# Patient Record
Sex: Male | Born: 1950
Health system: Southern US, Community
[De-identification: ages and names within clinical notes are randomized; demographics above are authoritative.]

## PROBLEM LIST (undated history)

## (undated) DIAGNOSIS — Z860101 Personal history of adenomatous and serrated colon polyps: Secondary | ICD-10-CM

## (undated) DIAGNOSIS — Z86010 Personal history of colonic polyps: Secondary | ICD-10-CM

## (undated) DIAGNOSIS — M1990 Unspecified osteoarthritis, unspecified site: Secondary | ICD-10-CM

## (undated) DIAGNOSIS — E785 Hyperlipidemia, unspecified: Secondary | ICD-10-CM

## (undated) DIAGNOSIS — K649 Unspecified hemorrhoids: Secondary | ICD-10-CM

## (undated) DIAGNOSIS — K219 Gastro-esophageal reflux disease without esophagitis: Secondary | ICD-10-CM

## (undated) DIAGNOSIS — D6851 Activated protein C resistance: Secondary | ICD-10-CM

## (undated) DIAGNOSIS — F32A Depression, unspecified: Secondary | ICD-10-CM

## (undated) DIAGNOSIS — C4490 Unspecified malignant neoplasm of skin, unspecified: Secondary | ICD-10-CM

## (undated) DIAGNOSIS — F329 Major depressive disorder, single episode, unspecified: Secondary | ICD-10-CM

## (undated) DIAGNOSIS — I10 Essential (primary) hypertension: Secondary | ICD-10-CM

## (undated) DIAGNOSIS — H3581 Retinal edema: Secondary | ICD-10-CM

## (undated) DIAGNOSIS — N529 Male erectile dysfunction, unspecified: Secondary | ICD-10-CM

## (undated) DIAGNOSIS — I2699 Other pulmonary embolism without acute cor pulmonale: Secondary | ICD-10-CM

## (undated) HISTORY — DX: Essential (primary) hypertension: I10

## (undated) HISTORY — PX: INGUINAL HERNIA REPAIR: SUR1180

## (undated) HISTORY — PX: SHOULDER ARTHROSCOPY W/ ROTATOR CUFF REPAIR: SHX2400

## (undated) HISTORY — PX: TONSILLECTOMY AND ADENOIDECTOMY: SUR1326

## (undated) HISTORY — PX: ULNAR TUNNEL RELEASE: SHX820

## (undated) HISTORY — DX: Hyperlipidemia, unspecified: E78.5

## (undated) HISTORY — PX: CARPAL TUNNEL RELEASE: SHX101

---

## 1997-09-28 ENCOUNTER — Ambulatory Visit (HOSPITAL_BASED_OUTPATIENT_CLINIC_OR_DEPARTMENT_OTHER): Admission: RE | Admit: 1997-09-28 | Discharge: 1997-09-28 | Payer: Self-pay | Admitting: General Surgery

## 2001-08-11 ENCOUNTER — Ambulatory Visit (HOSPITAL_BASED_OUTPATIENT_CLINIC_OR_DEPARTMENT_OTHER): Admission: RE | Admit: 2001-08-11 | Discharge: 2001-08-12 | Payer: Self-pay | Admitting: Orthopedic Surgery

## 2001-12-17 ENCOUNTER — Ambulatory Visit (HOSPITAL_BASED_OUTPATIENT_CLINIC_OR_DEPARTMENT_OTHER): Admission: RE | Admit: 2001-12-17 | Discharge: 2001-12-17 | Payer: Self-pay | Admitting: Orthopedic Surgery

## 2001-12-17 HISTORY — PX: OTHER SURGICAL HISTORY: SHX169

## 2002-03-18 ENCOUNTER — Ambulatory Visit (HOSPITAL_BASED_OUTPATIENT_CLINIC_OR_DEPARTMENT_OTHER): Admission: RE | Admit: 2002-03-18 | Discharge: 2002-03-18 | Payer: Self-pay | Admitting: General Surgery

## 2004-07-04 ENCOUNTER — Ambulatory Visit (HOSPITAL_COMMUNITY): Admission: RE | Admit: 2004-07-04 | Discharge: 2004-07-04 | Payer: Self-pay | Admitting: Gastroenterology

## 2005-08-05 ENCOUNTER — Encounter: Admission: RE | Admit: 2005-08-05 | Discharge: 2005-08-05 | Payer: Self-pay | Admitting: Internal Medicine

## 2006-06-25 ENCOUNTER — Encounter (INDEPENDENT_AMBULATORY_CARE_PROVIDER_SITE_OTHER): Payer: Self-pay | Admitting: *Deleted

## 2006-06-25 ENCOUNTER — Ambulatory Visit (HOSPITAL_BASED_OUTPATIENT_CLINIC_OR_DEPARTMENT_OTHER): Admission: RE | Admit: 2006-06-25 | Discharge: 2006-06-25 | Payer: Self-pay | Admitting: General Surgery

## 2006-06-25 HISTORY — PX: SOFT TISSUE MASS EXCISION: SHX2419

## 2009-04-23 ENCOUNTER — Observation Stay (HOSPITAL_COMMUNITY): Admission: RE | Admit: 2009-04-23 | Discharge: 2009-04-24 | Payer: Self-pay | Admitting: Neurological Surgery

## 2009-04-23 HISTORY — PX: ANTERIOR CERVICAL DECOMP/DISCECTOMY FUSION: SHX1161

## 2009-11-08 HISTORY — PX: ACHILLES TENDON REPAIR: SUR1153

## 2010-02-19 ENCOUNTER — Encounter: Payer: Self-pay | Admitting: Cardiovascular Disease

## 2010-02-19 ENCOUNTER — Ambulatory Visit: Payer: Self-pay

## 2010-02-19 DIAGNOSIS — M79609 Pain in unspecified limb: Secondary | ICD-10-CM | POA: Insufficient documentation

## 2010-02-22 ENCOUNTER — Encounter
Admission: RE | Admit: 2010-02-22 | Discharge: 2010-02-22 | Payer: Self-pay | Source: Home / Self Care | Attending: Orthopedic Surgery | Admitting: Orthopedic Surgery

## 2010-04-11 NOTE — Miscellaneous (Signed)
Summary: Orders Update  Clinical Lists Changes  Problems: Added new problem of LEG PAIN (ICD-729.5) Orders: Added new Test order of Venous Duplex Lower Extremity (Venous Duplex Lower) - Signed 

## 2010-05-29 LAB — BASIC METABOLIC PANEL
CO2: 28 mEq/L (ref 19–32)
Calcium: 9.1 mg/dL (ref 8.4–10.5)
Chloride: 103 mEq/L (ref 96–112)
Creatinine, Ser: 1.01 mg/dL (ref 0.4–1.5)
GFR calc Af Amer: >60 mL/min (ref >60)
Glucose, Bld: 93 mg/dL (ref 70–99)

## 2010-05-29 LAB — CBC
MCHC: 35.2 g/dL (ref 30.0–36.0)
MCV: 93.6 fL (ref 78.0–100.0)
RBC: 5.47 MIL/uL (ref 4.22–5.81)
RDW: 12.9 % (ref 11.5–15.5)

## 2010-05-29 LAB — TYPE AND SCREEN: ABO/RH(D): A POS

## 2010-07-17 ENCOUNTER — Emergency Department (HOSPITAL_COMMUNITY)
Admission: EM | Admit: 2010-07-17 | Discharge: 2010-07-17 | Disposition: A | Discharge status: Home / Self Care | Payer: BC Managed Care – PPO | Attending: Emergency Medicine | Admitting: Emergency Medicine

## 2010-07-17 DIAGNOSIS — I10 Essential (primary) hypertension: Secondary | ICD-10-CM | POA: Insufficient documentation

## 2010-07-17 DIAGNOSIS — F3289 Other specified depressive episodes: Secondary | ICD-10-CM | POA: Insufficient documentation

## 2010-07-17 DIAGNOSIS — F329 Major depressive disorder, single episode, unspecified: Secondary | ICD-10-CM | POA: Insufficient documentation

## 2010-07-17 DIAGNOSIS — R45851 Suicidal ideations: Secondary | ICD-10-CM | POA: Insufficient documentation

## 2010-07-17 LAB — COMPREHENSIVE METABOLIC PANEL WITH GFR
Alkaline Phosphatase: 31 U/L — ABNORMAL LOW (ref 39–117)
BUN: 12 mg/dL (ref 6–23)
CO2: 24 meq/L (ref 19–32)
Chloride: 101 meq/L (ref 96–112)
Creatinine, Ser: 0.89 mg/dL (ref 0.4–1.5)
GFR calc non Af Amer: >60 mL/min (ref >60)
Glucose, Bld: 87 mg/dL (ref 70–99)
Potassium: 3.3 meq/L — ABNORMAL LOW (ref 3.5–5.1)
Total Bilirubin: 0.5 mg/dL (ref 0.3–1.2)

## 2010-07-17 LAB — RAPID URINE DRUG SCREEN, HOSP PERFORMED
Amphetamines: NOT DETECTED
Barbiturates: NOT DETECTED
Benzodiazepines: NOT DETECTED
Cocaine: NOT DETECTED
Opiates: NOT DETECTED
Tetrahydrocannabinol: NOT DETECTED

## 2010-07-17 LAB — COMPREHENSIVE METABOLIC PANEL
ALT: 25 U/L (ref 0–53)
AST: 24 U/L (ref 0–37)
Albumin: 4.1 g/dL (ref 3.5–5.2)
Calcium: 9.4 mg/dL (ref 8.4–10.5)
GFR calc Af Amer: >60 mL/min (ref >60)
Sodium: 137 mEq/L (ref 135–145)
Total Protein: 6.9 g/dL (ref 6.0–8.3)

## 2010-07-17 LAB — DIFFERENTIAL
Basophils Absolute: 0.1 10*3/uL (ref 0.0–0.1)
Basophils Relative: 1 % (ref 0–1)
Eosinophils Absolute: 0.1 K/uL (ref 0.0–0.7)
Eosinophils Relative: 1 % (ref 0–5)
Lymphocytes Relative: 18 % (ref 12–46)
Lymphs Abs: 1.6 K/uL (ref 0.7–4.0)
Monocytes Absolute: 0.9 10*3/uL (ref 0.1–1.0)
Monocytes Relative: 10 % (ref 3–12)
Neutro Abs: 6.2 10*3/uL (ref 1.7–7.7)
Neutrophils Relative %: 70 % (ref 43–77)

## 2010-07-17 LAB — CBC
HCT: 48.6 % (ref 39.0–52.0)
Hemoglobin: 17.8 g/dL — ABNORMAL HIGH (ref 13.0–17.0)
MCH: 32.8 pg (ref 26.0–34.0)
MCHC: 36.6 g/dL — ABNORMAL HIGH (ref 30.0–36.0)
MCV: 89.5 fL (ref 78.0–100.0)
Platelets: 238 K/uL (ref 150–400)
RBC: 5.43 MIL/uL (ref 4.22–5.81)
RDW: 13 % (ref 11.5–15.5)
WBC: 8.8 10*3/uL (ref 4.0–10.5)

## 2010-07-17 LAB — ETHANOL: Alcohol, Ethyl (B): <11 mg/dL — ABNORMAL HIGH (ref 0–10)

## 2010-07-26 NOTE — Op Note (Signed)
NAME:  Jacob [name], Jacob [name] NO.:  [id number]   MEDICAL RECORD NO.:  [id number]                   PATIENT TYPE:  AMB   LOCATION:  DSC                                  FACILITY:  [facility]   PHYSICIAN:  Angelia Mould. Derrell Lolling, M.D.             DATE OF BIRTH:  [birth year]   DATE OF PROCEDURE:  DATE OF DISCHARGE:                                 OPERATIVE REPORT   PREOPERATIVE DIAGNOSIS:  Left inguinal hernia.   POSTOPERATIVE DIAGNOSIS:  Left inguinal hernia.   OPERATION PERFORMED:  Repair of left inguinal hernia with mesh Armanda Heritage  repair).   SURGEON:  Dr. Claud Kelp.   OPERATIVE INDICATIONS:  This is a 60 year old, white man who has had a left  inguinal hernia for a few years and has been slowly enlarging.  It has  become uncomfortable and has begun to interfere with his activities.  No  history of incarceration.  On exam, he has moderately large left inguinal  hernia that extends part way into the scrotum but it is completely  reducible.  He had a previous laparoscopic repair of his right inguinal  hernia and that repair is intact.  He is brought to the operating room  electively.   OPERATIVE TECHNIQUE:  The patient was placed supine on the operating room  table.  He was monitored and sedated by the Anesthesia Department.  The left  groin was prepped and draped in the sterile fashion.  1% Xylocaine with  epinephrine were used as a local infiltration anesthetic and a left  ilioinguinal nerve block.  An oblique incision was made overlying the  inguinal canal.  Dissection was carried down to the subcutaneous tissue,  exposing the aponeurosis of the external oblique.  The external oblique was  incised in the direction of its fibers, opening up and exposing the cord  structures.  The ilioinguinal nerve was immediately apparent and was  carefully dissected away from the cord structures and retracted out of  harm's way and preserved.  The nerve was intact at  the completion of the  case.  The cord structures were mobilized and encircled with a Penrose  drain.  The cremasteric muscle fibers were skeletonized.  I dissected a  large lipoma away from the cord structures, dissected it all the way back to  the internal ring.  This was examined, clamped, divided and ligated with 2-0  silk tie.  The lipoma was discarded.  He had a moderate sized indirect  hernia sac which I then dissected away from the cord structures, all the way  back to the level of the internal ring.  I opened the sac and inspected it  visually and digitally, and found that it was a simple sac with no  incarcerated components.  The sac was twisted and suture ligated at the  level of the internal ring with a suture ligature of 2-0  silk.  The floor of  the inguinal canal was repaired and reinforced with an onlay graft of  polypropylene mesh.  The mesh was cut into an appropriate shape and sutured  in place with running sutures of 2-0 Prolene.  A 3 inch x 16 inch piece of  mesh was used and cut down to size a little bit.  The mesh was sutured so as  to generously overlap the fascia and the pubic tubercle, along the inguinal  ligament inferiorly, then along the internal oblique and conjoined tendon  superiorly.  The mesh was incised laterally so as to wrap around the cord  structures at the internal ring.  The tails of the mesh were overlapped  laterally and the suture lines completed.  This provided a very secure  repair, both medial and lateral to the internal ring but allowed an adequate  opening for the cord structures.  The wound was irrigated with saline,  hemostasis was excellent.  The external oblique aponeurosis was closed with  a running suture of 2-0 Vicryl.  The Scarpa's fascia was closed with 3-0  Vicryl and the skin was closed with running subcuticular suture of 4-0  Vicryl and Steri-Strips.  Clean bandages were placed and the patient was  taken to the recovery room in  stable condition.  Estimated blood loss was  about 10 cc.    COMPLICATIONS:  None.   Sponge, needle and instrument counts were correct.                                               Angelia Mould. Derrell Lolling, M.D.    HMI/MEDQ  D:  03/18/2002  T:  03/19/2002  Job:  [id number]   cc:   Theressa Millard, M.D.  [address] [place]  [place] [address]  Fax: [phone]

## 2010-07-26 NOTE — Op Note (Signed)
NAMECHEN, Jacob NO.:  [name]   MEDICAL RECORD NO.:  [id number]          PATIENT TYPE:  AMB   LOCATION:  DSC                          FACILITY:  [facility]   PHYSICIAN:  Angelia Mould. Derrell Lolling, M.D.DATE OF BIRTH:  1950-11-27   DATE OF PROCEDURE:  06/25/2006  DATE OF DISCHARGE:                               OPERATIVE REPORT   PREOPERATIVE DIAGNOSIS:  Soft tissue mass of the right forearm.   POSTOPERATIVE DIAGNOSIS:  Soft tissue mass of the right forearm.   OPERATION PERFORMED:  Excision of 2.0 x 1.5 cm soft tissue mass, right  forearm   SURGEON:  Angelia Mould. Derrell Lolling, M.D.   OPERATIVE INDICATIONS:  This is a 60 year old white man who has noted a  soft tissue mass on the ulnar aspect of his right forearm for about 2  months.  It has been enlarging and has been irritating.  He has never  had a mass like this in the past.  On examination there is a 2.5 x 1.5  cm subcutaneous mass, somewhat lobulated, on the ulnar aspect of the mid  right forearm.  Consistency is consistent with a lipoma.  He is brought  to operating room electively.   OPERATIVE TECHNIQUE:  The patient was brought to the minor procedure  room at [facility] day surgery center.  He was positioned on the table.  The  right forearm was prepped and draped in a sterile fashion.  Xylocaine 1%  with epinephrine was used as a local infiltration anesthetic.  A  longitudinally-oriented incision was made overlying the mass.  Dissection was carried down into the subcutaneous tissue and then we  dissected around what appeared to be a very loosely-encapsulated  lipomatous-type mass.  This was excised in its entirety.  This was sent  for routine pathology.  Hemostasis was excellent.  The subcutaneous  tissue was closed with interrupted sutures of 3-0 Vicryl and the skin  closed with a running subcuticular suture of 4-0 Monocryl and Steri-  Strips.  A clean bandage was placed and the patient returned to the  waiting room  in excellent condition.  Estimated blood loss was about 5  mL.  Complications:  None.  Sponge, needle and instrument were correct.      Angelia Mould. Derrell Lolling, M.D.  Electronically Signed     HMI/MEDQ  D:  06/25/2006  T:  06/25/2006  Job:  [id number]   cc:   Theressa Millard, M.D.

## 2010-07-26 NOTE — Op Note (Signed)
NAME:  Jacob [name], BUSHWAY NO.:  [id number]   MEDICAL RECORD NO.:  [id number]                   PATIENT TYPE:  AMB   LOCATION:  DSC                                  FACILITY:  [facility]   PHYSICIAN:  Loreta Ave, M.D.              DATE OF BIRTH:  06-28-1950   DATE OF PROCEDURE:  12/17/2001  DATE OF DISCHARGE:  12/17/2001                                 OPERATIVE REPORT   PREOPERATIVE DIAGNOSES:  1. Impingement, left shoulder, with degenerative joint disease,     acromioclavicular joint.  2. Partial-thickness tearing, rotator cuff.   POSTOPERATIVE DIAGNOSES:  1. Impingement, left shoulder, with degenerative joint disease,     acromioclavicular joint.  2. Partial-thickness tearing, rotator cuff.  3. Some complex tearing, superior labrum.   PROCEDURES:  1. Left shoulder examination under anesthesia, arthroscopy, debridement of     labrum and undersurface tearing, rotator cuff.  2. Resection of subacromial bursa.  3. Acromioplasty.  4. Coracoacromial ligament release.  5. Excision of distal clavicle.   SURGEON:  Loreta Ave, M.D.   ASSISTANT:  Arlys Trei D. Petrarca, P.A.-C.   ANESTHESIA:  General.   ESTIMATED BLOOD LOSS:  Minimal.   SPECIMENS:  None.   CULTURES:  None.   COMPLICATIONS:  None.   DRESSING:  Soft compressive with sling.   DESCRIPTION OF PROCEDURE:  The patient was brought to the operating room and  placed on the operating table in supine position.  After adequate anesthesia  had been obtained, the left shoulder examined.  Full motion and good  stability.  Three standard arthroscopic portals for shoulder arthroscopy,  anterior, posterior, lateral.  The shoulder entered with a blunt obturator,  distended, and inspected.  Complex tearing of the labrum superiorly,  debrided to a stable surface.  No instability pattern.  Biceps tendon and  anchor intact with some mobility of the anchor but not a true SLAP lesion  requiring  repair.  Undersurface tearing crescent region.  The supraspinatus  tendon anterior half torn about halfway through but not all the way through.  Debrided to a stable surface.  Remaining rotator cuff intact.  Cannula  redirected subacromially.  Typical impingement with thickened bursa and  abrasive tearing on the top of the cuff.  Type 2 acromion.  Bursa resected,  cuff debrided and inspected.  Acromioplasty to a type 1 acromion with shaver  and high-speed bur, releasing the CA ligament with cautery.  Distal clavicle  had grade 3 and 4 changes also contributing to impingement.  Lateral  centimeter resected with shaver and high-speed bur.  Adequacy of  decompression confirmed viewing from all portals.  Instruments and fluid  removed.  Portals, shoulder, and bursa injected with Marcaine.  Portals  closed with 4-0 nylon.  Sterile compressive dressing applied.  Anesthesia  reversed.  Brought to the recovery room.  Tolerated the surgery well with  no  complications.                                                Loreta Ave, M.D.    DFM/MEDQ  D:  12/20/2001  T:  12/20/2001  Job:  [id number]

## 2010-07-26 NOTE — Op Note (Signed)
[place], HETZ NO.:  [id number]   MEDICAL RECORD NO.:  [id number]          PATIENT TYPE:  AMB   LOCATION:  ENDO                         FACILITY:  [facility]   PHYSICIAN:  Danise Edge, M.D.   DATE OF BIRTH:  1950-07-05   DATE OF PROCEDURE:  07/04/2004  DATE OF DISCHARGE:                                 OPERATIVE REPORT   PROCEDURE:  Screening colonoscopy.   INDICATIONS:  Jacob [name]. [name] 60 year old male, born 1951/02/19.  Jacob [name] is scheduled to undergo his first screening colonoscopy with  polypectomy to prevent colon cancer.   ENDOSCOPIST:  Dr. Reece Agar   PREMEDICATION:  1.  Versed 10 mg.  2.  Demerol 70 mg.   PROCEDURE:  After obtaining informed consent, Jacob [name] was placed in the  left lateral decubitus position.  I administered intravenous Demerol and  intravenous Versed to achieve conscious sedation for the procedure.  The  patient's blood pressure, oxygen saturation, and cardiac rhythm were  monitored throughout the procedure and documented in the medical record.   Anal inspection was normal.  Digital rectal exam reveals a nonnodular  prostate.  The Olympus adjustable pediatric colonoscope was introduced into  the rectum and easily advanced to the cecum.  The ileocecal valve was  identified.  The appendiceal orifice was identified.  A normal-appearing  ileocecal valve was intubated and the distal ileum inspected.  Colonic  preparation for the exam today was excellent.   Rectum normal.  Sigmoid colon and descending colon normal.  Splenic flexure normal.  Transverse colon normal.  Hepatic flexure normal.  Ascending colon normal.  Cecum and ileocecal valve normal.  Distal ileum normal.   ASSESSMENT:  Normal proctocolonoscopy to the cecum.  No endoscopic evidence  for the presence of colorectal neoplasia.      MJ/MEDQ  D:  07/04/2004  T:  07/04/2004  Job:  [id number]   cc:   Theressa Millard, M.D.  [address] [place]  [place] [address]  Fax: [phone]

## 2011-12-23 ENCOUNTER — Ambulatory Visit (INDEPENDENT_AMBULATORY_CARE_PROVIDER_SITE_OTHER): Payer: BC Managed Care – PPO | Admitting: Surgery

## 2011-12-23 ENCOUNTER — Encounter (INDEPENDENT_AMBULATORY_CARE_PROVIDER_SITE_OTHER): Payer: Self-pay | Admitting: Surgery

## 2011-12-23 VITALS — BP 136/88 | HR 84 | Temp 97.2°F | Resp 14 | Ht 67.5 in | Wt 183.0 lb

## 2011-12-23 DIAGNOSIS — K645 Perianal venous thrombosis: Secondary | ICD-10-CM

## 2011-12-23 NOTE — Patient Instructions (Signed)
Hemorrhoids  Hemorrhoids are veins in the rectum that get big. These veins can get blocked. Blocked veins become puffy (swollen) and painful.  HOME CARE   Eat more fiber.   Drink enough fluid to keep your pee (urine) clear or pale yellow.   Exercise often.   Avoid straining to poop (bowel movement).   Keep the butt area dry and clean.   Only take medicine as told by your doctor.  If your hemorrhoids are puffy and painful:   Take a warm bath for 20 to 30 minutes. Do this 3 to 4 times a day.   Place ice packs on the area. Use the ice packs between the baths.   Put ice in a plastic bag.   Place a towel between your skin and the bag.   Leave the ice on for 15 to 20 minutes, 3 to 4 times a day.   Do not use a donut-shaped pillow. Do not sit on the toilet for a long time.   Go to the bathroom when your body has the urge to poop. This is so you do not strain as much to poop.  GET HELP RIGHT AWAY IF:     You have increasing pain that is not controlled with medicine.   You have uncontrolled bleeding.   You cannot poop.   You have pain or puffiness outside the area of the hemorrhoids.   You have chills.   You have a temperature by mouth above 102 F (38.9 C), not controlled by medicine.  MAKE SURE YOU:     Understand these instructions.   Will watch your condition.   Will get help right away if you are not doing well or get worse.  Document Released: 12/04/2007 Document Revised: 05/19/2011 Document Reviewed: 12/04/2007  [employer] Patient Information 2013 [employer].

## 2011-12-23 NOTE — Progress Notes (Signed)
Chief complaint: Hemorrhoid bleeding  History of present illness: This patient presents to the urgent office today with some anal bleeding. Chest pain is closed. He had to strain at stool a few days ago and that's when it began. Initially was having a fair amount of pain but the pain has subsided.he has really had any prior problems.  Past history family history review of systems are noted in the psychotic medical record but did appear  Exam: Vital signs:BP 136/88  Pulse 84  Temp 97.2 F (36.2 C) (Temporal)  Resp 14  Ht 5' 7.5" (1.715 m)  Wt 183 lb (83.008 kg)  BMI 28.24 kg/m2 General: Patient oriented healthy-appearing and NAD. Anal area: He has a small right posterior thrombosed hemorrhoid which has spontaneously drained and almost resolved. There is a small skin tag on the left posterior aspect. This otherwise no other abnormality noted.  Impression:thrombosed external hemorrhoid spontaneously drained  Plan: He is to continue his perforation H., sitz baths and high-fiber diet. I think this will resolve spontaneously. I've given him instructions to that. He'll come back in 3 weeks for followup.

## 2012-04-06 ENCOUNTER — Other Ambulatory Visit: Payer: Self-pay | Admitting: Dermatology

## 2012-05-03 ENCOUNTER — Encounter (HOSPITAL_BASED_OUTPATIENT_CLINIC_OR_DEPARTMENT_OTHER): Payer: Self-pay | Admitting: *Deleted

## 2012-05-03 NOTE — Progress Notes (Signed)
Has had surgery here several times [place] Will come in for [id number]

## 2012-05-05 ENCOUNTER — Encounter (HOSPITAL_BASED_OUTPATIENT_CLINIC_OR_DEPARTMENT_OTHER)
Admission: RE | Admit: 2012-05-05 | Discharge: 2012-05-05 | Disposition: A | Discharge status: Home / Self Care | Payer: BC Managed Care – PPO | Source: Ambulatory Visit | Attending: Orthopedic Surgery | Admitting: Orthopedic Surgery

## 2012-05-05 LAB — BASIC METABOLIC PANEL
GFR calc Af Amer: 85 mL/min — ABNORMAL LOW (ref >90)
GFR calc non Af Amer: 73 mL/min — ABNORMAL LOW (ref >90)
Potassium: 4.1 mEq/L (ref 3.5–5.1)
Sodium: 139 mEq/L (ref 135–145)

## 2012-05-05 NOTE — H&P (Signed)
[facility] [address]   [address] [place], [place] [address] [phone] A Division of [facility]  Loreta Ave, M.D.   Robert A. Thurston Hole, M.D.   Burnell Blanks, M.D.   Eulas Post, M.D.   Lunette Stands, M.D Buford Dresser, M.D.  Charlsie Quest, M.D.   Estell Harpin, M.D.   Melina Fiddler, M.D. Genene Churn. Barry Dienes, PA-C            Kirstin A. Shepperson, PA-C Josh [place]o, PA-C [place], [place]   RE: Jacob [name], Jacob [name]    [id number]      DOB: Mar 17, 1950 PROGRESS NOTE: 04-13-12 Jacob [name] returns for follow-up. I called him after his MRI. His big issue is not pain but dysfunction and weakness. Unfortunately his MRI shows a recurrent large full thickness retracted tear back of the supraspinatus and top of the infraspinatus. Longhead biceps tendon tenodesis. No recurrent bony impingement. I went over history and workup to date. Initial repair in 2003. Repair of recurrent low trauma tear in 2012. Although some trauma with this new injury at the end of last year there's really been nothing dramatic. I'm concerned about the size of the tear whether this is reparable at all and also the integrity of his rotator cuff given history to date. He thoroughly understands the dilemma but we really have no choice but to try to improve things with re-repair his cuff. More than 25 minutes spent face to face covering our dilemma workup and treatment to date. Went over his previous operative intervention what we're proposing now as well as his recent scan. Fortunately there's not a lot of muscular atrophy and I'm still hopeful we're going to be able to get this repaired. Paperwork complete. Questions answered. Set this up in the near future.   Loreta Ave, M.D.  Electronically verified by Loreta Ave, M.D. DFM:kh D 04-13-12 T 04-13-12   [facility] [address]   [address] , [place]  [place] [address] [phone] A Division of [facility]  Loreta Ave, M.D.   Robert A. Thurston Hole, M.D.   Burnell Blanks, M.D.   Eulas Post, M.D.   Lunette Stands, M.D Buford Dresser, M.D.  Charlsie Quest, M.D.   Estell Harpin, M.D.   Melina Fiddler, M.D. Genene Churn. Barry Dienes, PA-C            Kirstin A. Shepperson, PA-C Josh Creswell, PA-C [place], [place]   RE: Jacob [name], Jacob [name]                                 [id number]      DOB: 1950-09-05 PROGRESS NOTE: 03-26-12 I was on the phone with Jacob [name] earlier this week.  He is having persistent symptoms in his right shoulder.  This is the side where we did a rotator cuff repair on about a year and a half ago.  He did great until he re-injured this trying to pick up a backpack awkwardly back in December of 2012.  That was when he was about four months post-op.  He has given this time and rest.  We have also injected him back at that time.  Improved to an extent, but never completely resolved.  He is maintaining good motion and good function, but it is the deep seated pain, especially at the end of the day,  that bothers him.  Not much in the biceps region, but all of this is subacromial.  No real neck pain or radicular complaints.  I have reviewed workup and treatment to date.  I went back and looked at his operative note from October 28, 2010.  We did a biceps re-implantation and cuff repair at that time, as well as a bony decompression.   Remaining history is reviewed, updated and included in the chart.  Of note, he continues to have some mild medial epicondylitis, right elbow.  This is still tolerable.  Tends to come and go.  Nothing requiring further significant workup or treatment at this time.    EXAMINATION: General exam is outlined and included in the chart.  Specifically, a little medial epicondylitis, right elbow.  Full motion.  Good stability.  No ulnar nerve irritation.  Right shoulder he has full motion.  There is no  atrophy.  Nice re-implantation proximal biceps without any deformity.  Positive impingement.  Positive palms down abduction.  A little give way weakness.  Neurovascularly intact otherwise.    DISPOSITION:  Given the persistent symptoms I need to reinvestigate this.  Arthrogram MRI.  Discussed this with him and he understands.  He is going to call me when the scan is complete and we will tailor further treatment.    Loreta Ave, M.D.   Electronically verified by Loreta Ave, M.D. DFM:jjh D 03-26-12 T 03-26-12

## 2012-05-06 ENCOUNTER — Encounter (HOSPITAL_BASED_OUTPATIENT_CLINIC_OR_DEPARTMENT_OTHER): Payer: Self-pay | Admitting: Anesthesiology

## 2012-05-06 ENCOUNTER — Ambulatory Visit (HOSPITAL_BASED_OUTPATIENT_CLINIC_OR_DEPARTMENT_OTHER): Payer: BC Managed Care – PPO | Admitting: Anesthesiology

## 2012-05-06 ENCOUNTER — Encounter (HOSPITAL_BASED_OUTPATIENT_CLINIC_OR_DEPARTMENT_OTHER)
Admission: RE | Disposition: A | Discharge status: Home / Self Care | Payer: Self-pay | Source: Ambulatory Visit | Attending: Orthopedic Surgery

## 2012-05-06 ENCOUNTER — Ambulatory Visit (HOSPITAL_BASED_OUTPATIENT_CLINIC_OR_DEPARTMENT_OTHER)
Admission: RE | Admit: 2012-05-06 | Discharge: 2012-05-06 | Disposition: A | Discharge status: Home / Self Care | Payer: BC Managed Care – PPO | Source: Ambulatory Visit | Attending: Orthopedic Surgery | Admitting: Orthopedic Surgery

## 2012-05-06 DIAGNOSIS — M67919 Unspecified disorder of synovium and tendon, unspecified shoulder: Secondary | ICD-10-CM | POA: Insufficient documentation

## 2012-05-06 DIAGNOSIS — M719 Bursopathy, unspecified: Secondary | ICD-10-CM | POA: Insufficient documentation

## 2012-05-06 DIAGNOSIS — Z9889 Other specified postprocedural states: Secondary | ICD-10-CM

## 2012-05-06 DIAGNOSIS — M24619 Ankylosis, unspecified shoulder: Secondary | ICD-10-CM | POA: Insufficient documentation

## 2012-05-06 DIAGNOSIS — Z01812 Encounter for preprocedural laboratory examination: Secondary | ICD-10-CM | POA: Insufficient documentation

## 2012-05-06 HISTORY — DX: Unspecified osteoarthritis, unspecified site: M19.90

## 2012-05-06 HISTORY — DX: Depression, unspecified: F32.A

## 2012-05-06 HISTORY — DX: Gastro-esophageal reflux disease without esophagitis: K21.9

## 2012-05-06 HISTORY — DX: Unspecified hemorrhoids: K64.9

## 2012-05-06 HISTORY — PX: SHOULDER ARTHROSCOPY WITH ROTATOR CUFF REPAIR AND SUBACROMIAL DECOMPRESSION: SHX5686

## 2012-05-06 HISTORY — DX: Major depressive disorder, single episode, unspecified: F32.9

## 2012-05-06 LAB — POCT HEMOGLOBIN-HEMACUE: Hemoglobin: 17.5 g/dL — ABNORMAL HIGH (ref 13.0–17.0)

## 2012-05-06 SURGERY — SHOULDER ARTHROSCOPY WITH ROTATOR CUFF REPAIR AND SUBACROMIAL DECOMPRESSION
Anesthesia: Regional | Laterality: Right

## 2012-05-06 MED ORDER — ONDANSETRON HCL 4 MG/2ML IJ SOLN: 4.0000 mg | Freq: Once | INTRAMUSCULAR | Status: DC | PRN

## 2012-05-06 MED ORDER — FENTANYL CITRATE 0.05 MG/ML IJ SOLN
INTRAMUSCULAR | Status: DC | PRN
  Administered 2012-05-06 (×2): 25 ug via INTRAVENOUS

## 2012-05-06 MED ORDER — FENTANYL CITRATE 0.05 MG/ML IJ SOLN
50.0000 ug | INTRAMUSCULAR | Status: DC | PRN
  Administered 2012-05-06: 100 ug via INTRAVENOUS

## 2012-05-06 MED ORDER — MIDAZOLAM HCL 2 MG/2ML IJ SOLN
1.0000 mg | INTRAMUSCULAR | Status: DC | PRN
  Administered 2012-05-06: 2 mg via INTRAVENOUS

## 2012-05-06 MED ORDER — PROPOFOL 10 MG/ML IV BOLUS
INTRAVENOUS | Status: DC | PRN
  Administered 2012-05-06: 250 mg via INTRAVENOUS

## 2012-05-06 MED ORDER — LACTATED RINGERS IV SOLN
INTRAVENOUS | Status: DC
  Administered 2012-05-06 (×2): via INTRAVENOUS

## 2012-05-06 MED ORDER — EPHEDRINE SULFATE 50 MG/ML IJ SOLN
INTRAMUSCULAR | Status: DC | PRN
  Administered 2012-05-06: 10 mg via INTRAVENOUS

## 2012-05-06 MED ORDER — HYDROMORPHONE HCL PF 1 MG/ML IJ SOLN: 0.2500 mg | INTRAMUSCULAR | Status: DC | PRN

## 2012-05-06 MED ORDER — VANCOMYCIN HCL IN DEXTROSE 1-5 GM/200ML-% IV SOLN
1000.0000 mg | INTRAVENOUS | Status: AC
  Administered 2012-05-06: 1000 mg via INTRAVENOUS

## 2012-05-06 MED ORDER — BUPIVACAINE-EPINEPHRINE PF 0.5-1:200000 % IJ SOLN
INTRAMUSCULAR | Status: DC | PRN
  Administered 2012-05-06: 25 mL

## 2012-05-06 MED ORDER — SODIUM CHLORIDE 0.9 % IR SOLN
Status: DC | PRN
  Administered 2012-05-06: 6000 mL

## 2012-05-06 MED ORDER — ONDANSETRON HCL 4 MG/2ML IJ SOLN
INTRAMUSCULAR | Status: DC | PRN
  Administered 2012-05-06: 4 mg via INTRAVENOUS

## 2012-05-06 MED ORDER — DEXAMETHASONE SODIUM PHOSPHATE 4 MG/ML IJ SOLN
INTRAMUSCULAR | Status: DC | PRN
  Administered 2012-05-06: 10 mg via INTRAVENOUS

## 2012-05-06 MED ORDER — SUCCINYLCHOLINE CHLORIDE 20 MG/ML IJ SOLN
INTRAMUSCULAR | Status: DC | PRN
  Administered 2012-05-06: 200 mg via INTRAVENOUS

## 2012-05-06 MED ORDER — DEXAMETHASONE SODIUM PHOSPHATE 10 MG/ML IJ SOLN
INTRAMUSCULAR | Status: DC | PRN
  Administered 2012-05-06: 10 mg

## 2012-05-06 MED ORDER — OXYCODONE HCL 5 MG/5ML PO SOLN: 5.0000 mg | Freq: Once | ORAL | Status: DC | PRN

## 2012-05-06 MED ORDER — OXYCODONE HCL 5 MG PO TABS: 5.0000 mg | ORAL_TABLET | Freq: Once | ORAL | Status: DC | PRN

## 2012-05-06 MED ORDER — HYDROMORPHONE HCL 2 MG PO TABS: 2.0000 mg | ORAL_TABLET | Freq: Four times a day (QID) | ORAL | Status: DC | PRN

## 2012-05-06 MED ORDER — LIDOCAINE HCL (CARDIAC) 20 MG/ML IV SOLN
INTRAVENOUS | Status: DC | PRN
  Administered 2012-05-06: 80 mg via INTRAVENOUS

## 2012-05-06 SURGICAL SUPPLY — 74 items
ANCH SUT SWLK 19.1X5.5 CLS EL (Anchor) ×2 IMPLANT
ANCHOR PEEK SWIVEL LOCK 5.5 (Anchor) ×2 IMPLANT
APL SKNCLS STERI-STRIP NONHPOA (GAUZE/BANDAGES/DRESSINGS) ×1
BENZOIN TINCTURE PRP APPL 2/3 (GAUZE/BANDAGES/DRESSINGS) ×1 IMPLANT
BLADE CUTTER GATOR 3.5 (BLADE) ×2 IMPLANT
BLADE CUTTER MENIS 5.5 (BLADE) IMPLANT
BLADE GREAT WHITE 4.2 (BLADE) ×2 IMPLANT
BLADE SURG 15 STRL LF DISP TIS (BLADE) IMPLANT
BLADE SURG 15 STRL SS (BLADE)
BUR OVAL 6.0 (BURR) ×2 IMPLANT
CANISTER OMNI JUG 16 LITER (MISCELLANEOUS) ×2 IMPLANT
CANISTER SUCTION 2500CC (MISCELLANEOUS) IMPLANT
CANNULA DRY DOC 8X75 (CANNULA) ×1 IMPLANT
CANNULA TWIST IN 8.25X7CM (CANNULA) IMPLANT
CLOTH BEACON ORANGE TIMEOUT ST (SAFETY) ×2 IMPLANT
DECANTER SPIKE VIAL GLASS SM (MISCELLANEOUS) IMPLANT
DRAPE OEC MINIVIEW 54X84 (DRAPES) IMPLANT
DRAPE STERI 35X30 U-POUCH (DRAPES) ×2 IMPLANT
DRAPE U-SHAPE 47X51 STRL (DRAPES) ×2 IMPLANT
DRAPE U-SHAPE 76X120 STRL (DRAPES) ×4 IMPLANT
DRSG PAD ABDOMINAL 8X10 ST (GAUZE/BANDAGES/DRESSINGS) ×2 IMPLANT
DURAPREP 26ML APPLICATOR (WOUND CARE) ×2 IMPLANT
ELECT MENISCUS 165MM 90D (ELECTRODE) ×2 IMPLANT
ELECT NDL TIP 2.8 STRL (NEEDLE) IMPLANT
ELECT NEEDLE TIP 2.8 STRL (NEEDLE) IMPLANT
ELECT REM PT RETURN 9FT ADLT (ELECTROSURGICAL) ×2
ELECTRODE REM PT RTRN 9FT ADLT (ELECTROSURGICAL) ×1 IMPLANT
GAUZE XEROFORM 1X8 LF (GAUZE/BANDAGES/DRESSINGS) ×2 IMPLANT
GLOVE BIO SURGEON STRL SZ 6.5 (GLOVE) ×2 IMPLANT
GLOVE BIOGEL PI IND STRL 8 (GLOVE) ×1 IMPLANT
GLOVE BIOGEL PI INDICATOR 8 (GLOVE) ×1
GLOVE INDICATOR 7.0 STRL GRN (GLOVE) ×1 IMPLANT
GLOVE ORTHO TXT STRL SZ7.5 (GLOVE) ×4 IMPLANT
GOWN PREVENTION PLUS XLARGE (GOWN DISPOSABLE) ×3 IMPLANT
GOWN STRL REIN 2XL XLG LVL4 (GOWN DISPOSABLE) ×2 IMPLANT
IV NS IRRIG 3000ML ARTHROMATIC (IV SOLUTION) ×4 IMPLANT
NDL SCORPION MULTI FIRE (NEEDLE) IMPLANT
NDL SUT 6 .5 CRC .975X.05 MAYO (NEEDLE) IMPLANT
NEEDLE MAYO TAPER (NEEDLE)
NEEDLE SCORPION MULTI FIRE (NEEDLE) ×2 IMPLANT
NS IRRIG 1000ML POUR BTL (IV SOLUTION) IMPLANT
PACK ARTHROSCOPY DSU (CUSTOM PROCEDURE TRAY) ×2 IMPLANT
PACK BASIN DAY SURGERY FS (CUSTOM PROCEDURE TRAY) ×2 IMPLANT
PASSER SUT SWANSON 36MM LOOP (INSTRUMENTS) IMPLANT
PENCIL BUTTON HOLSTER BLD 10FT (ELECTRODE) ×2 IMPLANT
SET ARTHROSCOPY TUBING (MISCELLANEOUS) ×2
SET ARTHROSCOPY TUBING LN (MISCELLANEOUS) ×1 IMPLANT
SLEEVE SCD COMPRESS KNEE MED (MISCELLANEOUS) ×1 IMPLANT
SLING ARM FOAM STRAP LRG (SOFTGOODS) IMPLANT
SLING ARM FOAM STRAP MED (SOFTGOODS) IMPLANT
SLING ARM FOAM STRAP XLG (SOFTGOODS) IMPLANT
SLING ARM IMMOBILIZER LRG (SOFTGOODS) IMPLANT
SLING ARM IMMOBILIZER MED (SOFTGOODS) IMPLANT
SLING ULTRA II LARGE (SOFTGOODS) ×1 IMPLANT
SPONGE GAUZE 4X4 12PLY (GAUZE/BANDAGES/DRESSINGS) ×4 IMPLANT
SPONGE LAP 4X18 X RAY DECT (DISPOSABLE) IMPLANT
STRIP CLOSURE SKIN 1/2X4 (GAUZE/BANDAGES/DRESSINGS) IMPLANT
SUCTION FRAZIER TIP 10 FR DISP (SUCTIONS) IMPLANT
SUT ETHIBOND 2 OS 4 DA (SUTURE) IMPLANT
SUT ETHILON 2 0 FS 18 (SUTURE) IMPLANT
SUT ETHILON 3 0 PS 1 (SUTURE) ×1 IMPLANT
SUT FIBERWIRE #2 38 T-5 BLUE (SUTURE)
SUT RETRIEVER MED (INSTRUMENTS) IMPLANT
SUT TIGER TAPE 7 IN WHITE (SUTURE) ×1 IMPLANT
SUT VIC AB 0 CT1 27 (SUTURE)
SUT VIC AB 0 CT1 27XBRD ANBCTR (SUTURE) IMPLANT
SUT VIC AB 2-0 SH 27 (SUTURE)
SUT VIC AB 2-0 SH 27XBRD (SUTURE) IMPLANT
SUT VIC AB 3-0 FS2 27 (SUTURE) IMPLANT
SUTURE FIBERWR #2 38 T-5 BLUE (SUTURE) IMPLANT
TAPE FIBER 2MM 7IN #2 BLUE (SUTURE) ×1 IMPLANT
TOWEL OR 17X24 6PK STRL BLUE (TOWEL DISPOSABLE) ×2 IMPLANT
WATER STERILE IRR 1000ML POUR (IV SOLUTION) ×2 IMPLANT
YANKAUER SUCT BULB TIP NO VENT (SUCTIONS) IMPLANT

## 2012-05-06 NOTE — Progress Notes (Signed)
Assisted Dr. Crews with right, ultrasound guided, interscalene  block. Side rails up, monitors on throughout procedure. See vital signs in flow sheet. Tolerated Procedure well. 

## 2012-05-06 NOTE — Anesthesia Postprocedure Evaluation (Signed)
  Anesthesia Post-op Note  Patient: Jacob [name]  Procedure(s) Performed: Procedure(s) with comments: SHOULDER ARTHROSCOPY WITH ROTATOR CUFF REPAIR AND SUBACROMIAL DECOMPRESSION (Right) - RIGHT SHOULDER ARTHROSCOPY  lysis of adhesions rotator cuff repair   Patient Location: PACU  Anesthesia Type:GA combined with regional for post-op pain  Level of Consciousness: awake, alert  and oriented  Airway and Oxygen Therapy: Patient Spontanous Breathing  Post-op Pain: none  Post-op Assessment: Post-op Vital signs reviewed  Post-op Vital Signs: Reviewed  Complications: No apparent anesthesia complications

## 2012-05-06 NOTE — Anesthesia Preprocedure Evaluation (Signed)
Anesthesia Evaluation  Patient identified by MRN, date of birth, ID band Patient awake    Reviewed: Allergy & Precautions, H&P , NPO status , Patient's Chart, lab work & pertinent test results  Airway Mallampati: I TM Distance: >3 FB Neck ROM: Full    Dental  (+) Teeth Intact and Dental Advisory Given   Pulmonary  breath sounds clear to auscultation        Cardiovascular Pt. on medications Rhythm:Regular Rate:Normal     Neuro/Psych    GI/Hepatic GERD-  Medicated and Controlled,  Endo/Other    Renal/GU      Musculoskeletal   Abdominal   Peds  Hematology   Anesthesia Other Findings   Reproductive/Obstetrics                           Anesthesia Physical Anesthesia Plan  ASA: II  Anesthesia Plan: General   Post-op Pain Management:    Induction: Intravenous  Airway Management Planned: Oral ETT  Additional Equipment:   Intra-op Plan:   Post-operative Plan: Extubation in OR  Informed Consent: I have reviewed the patients History and Physical, chart, labs and discussed the procedure including the risks, benefits and alternatives for the proposed anesthesia with the patient or authorized representative who has indicated his/her understanding and acceptance.   Dental advisory given  Plan Discussed with: CRNA, Anesthesiologist and Surgeon  Anesthesia Plan Comments:         Anesthesia Quick Evaluation

## 2012-05-06 NOTE — Brief Op Note (Signed)
05/06/2012  12:35 PM  PATIENT:  Jacob [name]  62 y.o. male  PRE-OPERATIVE DIAGNOSIS:  right shoulder impingment syndrome degenerative arthritis ac joint, complete rupture of rotator cuff   POST-OPERATIVE DIAGNOSIS:  * No post-op diagnosis entered *  PROCEDURE:  Procedure(s) with comments: SHOULDER ARTHROSCOPY WITH ROTATOR CUFF REPAIR AND SUBACROMIAL DECOMPRESSION (Right) - RIGHT SHOULDER ARTHROSCOPY  lysis of adhesions rotator cuff repair   SURGEON:  Surgeon(s) and Role:    * Loreta Ave, MD - Primary  PHYSICIAN ASSISTANT: Zonia Kief M     ANESTHESIA:   regional and general  EBL:  Total I/O In: 1700 [I.V.:1700] Out: -     SPECIMEN:  No Specimen  DISPOSITION OF SPECIMEN:  N/A  COUNTS:  YES  TOURNIQUET:  * No tourniquets in log *   PATIENT DISPOSITION:  PACU - hemodynamically stable.

## 2012-05-06 NOTE — Interval H&P Note (Signed)
History and Physical Interval Note:  05/06/2012 7:27 AM  Jacob [name]  has presented today for surgery, with the diagnosis of right shoulder impingment syndrome degenerative arthritis ac joint, complete rupture of rotator cuff   The various methods of treatment have been discussed with the patient and family. After consideration of risks, benefits and other options for treatment, the patient has consented to  Procedure(s) with comments: SHOULDER ARTHROSCOPY WITH ROTATOR CUFF REPAIR AND SUBACROMIAL DECOMPRESSION (Right) - RIGHT SHOULDER ARTHROSCOPY DECOMPRESSION SUBACROMIAL PARTIAL ACROMIOPLASTY WITH CORACOACROMIAL RELEASE , DISTAL CLAVICULECTOMY, WITH ROTATOR CUFF REPAIR   as a surgical intervention .  The patient's history has been reviewed, patient examined, no change in status, stable for surgery.  I have reviewed the patient's chart and labs.  Questions were answered to the patient's satisfaction.     Larkyn Greenberger F

## 2012-05-06 NOTE — Anesthesia Procedure Notes (Addendum)
Anesthesia Regional Block:  Interscalene brachial plexus block  Pre-Anesthetic Checklist: ,, timeout performed, Correct Patient, Correct Site, Correct Laterality, Correct Procedure, Correct Position, site marked, Risks and benefits discussed,  Surgical consent,  Pre-op evaluation,  At surgeon's request and post-op pain management  Laterality: Right and Upper  Prep: chloraprep       Needles:   Needle Type: Echogenic Needle     Needle Length: 5cm 5 cm Needle Gauge: 21    Additional Needles:  Procedures: ultrasound guided (picture in chart) Interscalene brachial plexus block Narrative:  Start time: 05/06/2012 10:43 AM End time: 05/06/2012 10:50 AM Injection made incrementally with aspirations every 5 mL.  Performed by: Personally  Anesthesiologist: Sheldon Silvan  Interscalene brachial plexus block Procedure Name: Intubation Performed by: York Grice Pre-anesthesia Checklist: Patient identified, Timeout performed, Emergency Drugs available, Suction available and Patient being monitored Patient Re-evaluated:Patient Re-evaluated prior to inductionOxygen Delivery Method: Circle system utilized Preoxygenation: Pre-oxygenation with 100% oxygen Intubation Type: IV induction Ventilation: Mask ventilation without difficulty Laryngoscope Size: Miller and 2 Grade View: Grade I Tube type: Oral Tube size: 8.0 mm Number of attempts: 1 Airway Equipment and Method: Stylet Placement Confirmation: ETT inserted through vocal cords under direct vision,  breath sounds checked- equal and bilateral and positive ETCO2 Secured at: 23 cm Tube secured with: Tape Dental Injury: Teeth and Oropharynx as per pre-operative assessment

## 2012-05-06 NOTE — Transfer of Care (Signed)
Immediate Anesthesia Transfer of Care Note  Patient: Jacob [name]  Procedure(s) Performed: Procedure(s) with comments: SHOULDER ARTHROSCOPY WITH ROTATOR CUFF REPAIR AND SUBACROMIAL DECOMPRESSION (Right) - RIGHT SHOULDER ARTHROSCOPY  lysis of adhesions rotator cuff repair   Patient Location: PACU  Anesthesia Type:GA combined with regional for post-op pain  Level of Consciousness: awake, alert  and oriented  Airway & Oxygen Therapy: Patient Spontanous Breathing and Patient connected to face mask oxygen  Post-op Assessment: Report given to PACU RN, Post -op Vital signs reviewed and stable and Patient moving all extremities  Post vital signs: Reviewed and stable  Complications: No apparent anesthesia complications

## 2012-05-10 ENCOUNTER — Encounter (HOSPITAL_BASED_OUTPATIENT_CLINIC_OR_DEPARTMENT_OTHER): Payer: Self-pay | Admitting: Orthopedic Surgery

## 2012-05-10 NOTE — Op Note (Signed)
NAME:  Jacob [name], Jacob [name] NO.:  [id number]  MEDICAL RECORD NO.:  [id number]  LOCATION:                               FACILITY:  [facility]  PHYSICIAN:  Loreta Ave, M.D. DATE OF BIRTH:  02/26/1951  DATE OF PROCEDURE:  05/06/2012 DATE OF DISCHARGE:  05/06/2012                              OPERATIVE REPORT   PREOPERATIVE DIAGNOSES: 1. Right shoulder recurrent rotator cuff tear. 2. Retracted supraspinatus, infraspinatus tendon. 3. Previous adequate bony decompression. 4. Previous extra-articular biceps tenodesis.  POSTOPERATIVE DIAGNOSIS: 1. Right shoulder recurrent rotator cuff tear. 2. Retracted supraspinatus, infraspinatus tendon. 3. Previous adequate bony decompression. 4. Previous extra-articular biceps tenodesis. 5. Considerable tendon retraction as well as subacromial adhesions.  PROCEDURES: 1. Right shoulder exam under anesthesia, arthroscopy. 2. Lysis debridement of adhesions, mobilization of rotator cuff. 3. Rotator cuff repair with a fiber-weaved baseball stitch, swivel     lock anchor. 4. Additional secondary fiber-weaved stitch and swivel lock anchor.  SURGEON:  Loreta Ave, MD  ASSISTANT:  Genene Churn. Barry Dienes, [place], present throughout the entire case and necessary for timely completion of procedure.  ANESTHESIA:  General.  ESTIMATED BLOOD LOSS:  Minimal.  SPECIMENS:  None.  CULTURES:  None.  COMPLICATIONS:  None.  DRESSINGS:  Soft compressive with shoulder immobilizer.  DESCRIPTION OF PROCEDURE:  The patient was brought to the operating room, placed on the operating table in supine position.  After adequate anesthesia had been obtained, the right shoulder was examined.  Full motion of stable shoulder.  Proceeded with arthroscopy.  Two portals, 1 posterior, 1 lateral.  Arthroscope was introduced, shoulder distended, and inspected.  The shoulder itself had intact articular cartilage and labrum.  Biceps had already been tenodesed  extra-articularly.  Marked attritional tearing of the cuff entire back to third of the supraspinatus and the top third of the infraspinatus.  A lot of intratendinous tearing.  I was able to free up adhesions above and below to mobilize this.  I could bring the infraspinatus back up over top of the humeral head and the supraspinatus back to meet that to do a interval type repair.  This took a fair amount of work to mobilize everything, but at completion, I was very happy with mobilization. Utilizing a Scorpion device, I then placed a baseball stitch in these infraspinatus and supraspinatus tendon, starting medially, working over laterally.  This was then anchored down with a swivel lock anchor into the medial attachment of the cuff in the roughened tuberosity.  With the remaining infraspinatus and supraspinatus tendon, a little bit more laterally, I placed another fiber weave stitch and then brought the infraspinatus over top of the bare area of the tuberosity and anchored down a little bit more anteriorly with a second swivel lock anchor.  At completion, I had a nice firm repair, watertight closure of the cuff. Adequacy of decompression was assessed and was adequate and I did not need to do any further bony work.  Instruments were removed.  Portals were closed with nylon.  Sterile compressive dressing applied.  Shoulder immobilizer applied.  Anesthesia reversed.  Brought to the recovery room.  Tolerated surgery well.  No complications.  Loreta Ave, M.D.     DFM/MEDQ  D:  05/06/2012  T:  05/07/2012  Job:  [id number]

## 2014-12-06 ENCOUNTER — Encounter (HOSPITAL_COMMUNITY): Payer: Self-pay | Admitting: *Deleted

## 2014-12-06 ENCOUNTER — Other Ambulatory Visit: Payer: Self-pay | Admitting: Gastroenterology

## 2014-12-11 ENCOUNTER — Ambulatory Visit (HOSPITAL_COMMUNITY): Payer: BLUE CROSS/BLUE SHIELD | Admitting: Certified Registered Nurse Anesthetist

## 2014-12-11 ENCOUNTER — Ambulatory Visit (HOSPITAL_COMMUNITY)
Admission: RE | Admit: 2014-12-11 | Discharge: 2014-12-11 | Disposition: A | Discharge status: Home / Self Care | Payer: BLUE CROSS/BLUE SHIELD | Source: Ambulatory Visit | Attending: Gastroenterology | Admitting: Gastroenterology

## 2014-12-11 ENCOUNTER — Encounter (HOSPITAL_COMMUNITY): Payer: Self-pay | Admitting: *Deleted

## 2014-12-11 ENCOUNTER — Encounter (HOSPITAL_COMMUNITY)
Admission: RE | Disposition: A | Discharge status: Home / Self Care | Payer: Self-pay | Source: Ambulatory Visit | Attending: Gastroenterology

## 2014-12-11 DIAGNOSIS — D124 Benign neoplasm of descending colon: Secondary | ICD-10-CM | POA: Insufficient documentation

## 2014-12-11 DIAGNOSIS — K219 Gastro-esophageal reflux disease without esophagitis: Secondary | ICD-10-CM | POA: Diagnosis not present

## 2014-12-11 DIAGNOSIS — E7800 Pure hypercholesterolemia, unspecified: Secondary | ICD-10-CM | POA: Insufficient documentation

## 2014-12-11 DIAGNOSIS — K621 Rectal polyp: Secondary | ICD-10-CM | POA: Diagnosis not present

## 2014-12-11 DIAGNOSIS — I10 Essential (primary) hypertension: Secondary | ICD-10-CM | POA: Insufficient documentation

## 2014-12-11 DIAGNOSIS — Z1211 Encounter for screening for malignant neoplasm of colon: Secondary | ICD-10-CM | POA: Insufficient documentation

## 2014-12-11 HISTORY — PX: COLONOSCOPY WITH PROPOFOL: SHX5780

## 2014-12-11 SURGERY — COLONOSCOPY WITH PROPOFOL
Anesthesia: Monitor Anesthesia Care

## 2014-12-11 MED ORDER — SODIUM CHLORIDE 0.9 % IV SOLN: INTRAVENOUS | Status: DC

## 2014-12-11 MED ORDER — PROPOFOL 10 MG/ML IV BOLUS
INTRAVENOUS | Status: AC
  Filled 2014-12-11: qty 20

## 2014-12-11 MED ORDER — LACTATED RINGERS IV SOLN
INTRAVENOUS | Status: DC | PRN
  Administered 2014-12-11: 08:00:00 via INTRAVENOUS

## 2014-12-11 MED ORDER — PROPOFOL 10 MG/ML IV BOLUS
INTRAVENOUS | Status: DC | PRN
  Administered 2014-12-11 (×2): 40 mg via INTRAVENOUS
  Administered 2014-12-11: 20 mg via INTRAVENOUS
  Administered 2014-12-11 (×4): 40 mg via INTRAVENOUS

## 2014-12-11 MED ORDER — LACTATED RINGERS IV SOLN
INTRAVENOUS | Status: DC
  Administered 2014-12-11: 1000 mL via INTRAVENOUS

## 2014-12-11 SURGICAL SUPPLY — 22 items

## 2014-12-11 NOTE — Anesthesia Postprocedure Evaluation (Signed)
  Anesthesia Post-op Note  Patient: Jacob [name]  Procedure(s) Performed: Procedure(s) (LRB): COLONOSCOPY WITH PROPOFOL (N/A)  Patient Location: PACU  Anesthesia Type: MAC  Level of Consciousness: awake and alert   Airway and Oxygen Therapy: Patient Spontanous Breathing  Post-op Pain: mild  Post-op Assessment: Post-op Vital signs reviewed, Patient's Cardiovascular Status Stable, Respiratory Function Stable, Patent Airway and No signs of Nausea or vomiting  Last Vitals:  Filed Vitals:   12/11/14 1000  BP: 142/91  Pulse: 78  Temp:   Resp: 17    Post-op Vital Signs: stable   Complications: No apparent anesthesia complications

## 2014-12-11 NOTE — Discharge Instructions (Signed)
Colonoscopy, Care After °These instructions give you information on caring for yourself after your procedure. Your doctor may also give you more specific instructions. Call your doctor if you have any problems or questions after your procedure. °HOME CARE °· Do not drive for 24 hours. °· Do not sign important papers or use machinery for 24 hours. °· You may shower. °· You may go back to your usual activities, but go slower for the first 24 hours. °· Take rest breaks often during the first 24 hours. °· Walk around or use warm packs on your belly (abdomen) if you have belly cramping or gas. °· Drink enough fluids to keep your pee (urine) clear or pale yellow. °· Resume your normal diet. Avoid heavy or fried foods. °· Avoid drinking alcohol for 24 hours or as told by your doctor. °· Only take medicines as told by your doctor. °If a tissue sample (biopsy) was taken during the procedure:  °· Do not take aspirin or blood thinners for 7 days, or as told by your doctor. °· Do not drink alcohol for 7 days, or as told by your doctor. °· Eat soft foods for the first 24 hours. °GET HELP IF: °You still have a small amount of blood in your poop (stool) 2-3 days after the procedure. °GET HELP RIGHT AWAY IF: °· You have more than a small amount of blood in your poop. °· You see clumps of tissue (blood clots) in your poop. °· Your belly is puffy (swollen). °· You feel sick to your stomach (nauseous) or throw up (vomit). °· You have a fever. °· You have belly pain that gets worse and medicine does not help. °MAKE SURE YOU: °· Understand these instructions. °· Will watch your condition. °· Will get help right away if you are not doing well or get worse. °Document Released: 03/29/2010 Document Revised: 03/01/2013 Document Reviewed: 11/01/2012 °Exit[employer]® Patient Information ©2015 [employer]. This information is not intended to replace advice given to you by your health care provider. Make sure you discuss any questions you have with  your health care provider. ° °

## 2014-12-11 NOTE — Anesthesia Preprocedure Evaluation (Signed)
Anesthesia Evaluation  Patient identified by MRN, date of birth, ID band Patient awake    Reviewed: Allergy & Precautions, NPO status , Patient's Chart, lab work & pertinent test results  Airway Mallampati: II  TM Distance: >3 FB Neck ROM: Full    Dental no notable dental hx.    Pulmonary neg pulmonary ROS,    Pulmonary exam normal breath sounds clear to auscultation       Cardiovascular hypertension, Pt. on medications Normal cardiovascular exam Rhythm:Regular Rate:Normal     Neuro/Psych negative neurological ROS  negative psych ROS   GI/Hepatic negative GI ROS, Neg liver ROS,   Endo/Other  negative endocrine ROS  Renal/GU negative Renal ROS  negative genitourinary   Musculoskeletal negative musculoskeletal ROS (+)   Abdominal   Peds negative pediatric ROS (+)  Hematology negative hematology ROS (+)   Anesthesia Other Findings   Reproductive/Obstetrics negative OB ROS                             Anesthesia Physical Anesthesia Plan  ASA: II  Anesthesia Plan: MAC   Post-op Pain Management:    Induction: Intravenous  Airway Management Planned: Simple Face Mask  Additional Equipment:   Intra-op Plan:   Post-operative Plan:   Informed Consent: I have reviewed the patients History and Physical, chart, labs and discussed the procedure including the risks, benefits and alternatives for the proposed anesthesia with the patient or authorized representative who has indicated his/her understanding and acceptance.   Dental advisory given  Plan Discussed with: CRNA  Anesthesia Plan Comments:         Anesthesia Quick Evaluation

## 2014-12-11 NOTE — H&P (Signed)
  Procedure: Screening colonoscopy. Normal screening colonoscopy performed on 07/04/2004  History: The patient is a 64 year old male born 06-18-1950. He is scheduled to undergo a repeat screening colonoscopy today.  Medication allergies: Penicillin  Past medical history: Hypertension. Ruptured Achilles tendon. Gastroesophageal reflux. Hypercholesterolemia. Cervical laminectomy. Shoulder surgery.  Exam: The patient is alert and lying comfortably on the endoscopy stretcher. Abdomen is soft and nontender to palpation. Lungs are clear to auscultation. Cardiac exam reveals a regular rhythm.  Plan: Proceed with screening colonoscopy

## 2014-12-11 NOTE — Transfer of Care (Signed)
Immediate Anesthesia Transfer of Care Note  Patient: Jacob [name]  Procedure(s) Performed: Procedure(s): COLONOSCOPY WITH PROPOFOL (N/A)  Patient Location: PACU and Endoscopy Unit  Anesthesia Type:MAC  Level of Consciousness: awake, oriented, patient cooperative, lethargic and responds to stimulation  Airway & Oxygen Therapy: Patient Spontanous Breathing and Patient connected to face mask oxygen  Post-op Assessment: Report given to RN, Post -op Vital signs reviewed and stable and Patient moving all extremities  Post vital signs: Reviewed and stable  Last Vitals:  Filed Vitals:   12/11/14 0801  BP: 150/94  Temp: 37 C  Resp: 11    Complications: No apparent anesthesia complications

## 2014-12-11 NOTE — Op Note (Signed)
Procedure: Screening colonoscopy. Normal screening colonoscopy performed on 07/04/2004  Endoscopist: Earle Gell  Premedication: Propofol administered by anesthesia  Procedure: The patient was placed in the left lateral decubitus position. Anal inspection and digital rectal exam were normal. The Pentax pediatric colonoscope was introduced into the rectum and advanced to the cecum. A normal-appearing appendiceal orifice and ileocecal valve were identified. Colonic preparation for the exam today was good. Withdrawal time was 14 minutes  Rectum. From the proximal rectum an 8 mm sessile serrated appearing polyp was removed with the electrocautery snare and an Endo Clip was applied to the polypectomy site to prevent bleeding. Retroflexed view of the distal rectum was normal.  Sigmoid colon and descending colon. From the proximal descending colon, a 5 mm sessile polyp was removed with the cold snare  Splenic flexure. Normal  Transverse colon. Normal  Hepatic flexure. Normal  Ascending colon. Normal  Cecum and ileocecal valve. Normal  Assessment: A small polyp was removed from the proximal descending colon and a small polyp was removed from the proximal rectum. Otherwise normal colonoscopy  Recommendation: If the polyps returned adenomatous pathologically, schedule repeat colonoscopy in 5 years

## 2014-12-12 ENCOUNTER — Encounter (HOSPITAL_COMMUNITY): Payer: Self-pay | Admitting: Gastroenterology

## 2016-02-04 HISTORY — PX: TOTAL SHOULDER ARTHROPLASTY: SHX126

## 2016-03-19 ENCOUNTER — Other Ambulatory Visit: Payer: Self-pay | Admitting: Urology

## 2016-03-30 NOTE — H&P (Signed)
Urology History and Physical Exam  CC: Erectile dysfunction, penile curvature  HPI: 66 year old male With long-standing erectile dysfunction.  He has been treated with PDE 5 inhibitors as well as injectable prostaglandin.  The patient still has significant issues with the inability to obtain and sustain erection.  Additionally, he has lateral curvature of his penis since he has been using intracorporeal injections.  I have discussed with the patient, several times in the past, other options regarding erectile dysfunction.  These have included placement of an inflatable penile prosthesis.  The patient presents at this time for that procedure.  I have discussed the procedure as well as risks and complications.  These include but are not limited to infection with the need to replaced/remove the implant, bleeding, continued lateral curvature of the penis with erections, mechanical malfunction, among others.  He understands these and desires to proceed.  PMH: Past Medical History:  Diagnosis Date  . Depression   . DJD (degenerative joint disease)   . GERD (gastroesophageal reflux disease)   . Hemorrhoids   . Hypertension     PSH: Past Surgical History:  Procedure Laterality Date  . ACHILLES TENDON REPAIR  11/2009  . CERVICAL FUSION  2011  . COLONOSCOPY    . COLONOSCOPY WITH PROPOFOL N/A 12/11/2014   Procedure: COLONOSCOPY WITH PROPOFOL;  Surgeon: Garlan Fair, MD;  Location: [facility];  Service: Endoscopy;  Laterality: N/A;  . HERNIA REPAIR  2004   left inguinal  . ROTATOR CUFF REPAIR  10/2010  . SHOULDER ARTHROSCOPY  03,11   right  . SHOULDER ARTHROSCOPY  03   left  . SHOULDER ARTHROSCOPY WITH ROTATOR CUFF REPAIR AND SUBACROMIAL DECOMPRESSION Right 05/06/2012   Procedure: SHOULDER ARTHROSCOPY WITH ROTATOR CUFF REPAIR AND SUBACROMIAL DECOMPRESSION;  Surgeon: Ninetta Lights, MD;  Location: [facility];  Service: Orthopedics;  Laterality: Right;  RIGHT SHOULDER ARTHROSCOPY   lysis of adhesions rotator cuff repair   . SOFT TISSUE MASS EXCISION  2008   rt forearm  . SPINAL FUSION  04/2009  . TONSILLECTOMY      Allergies: Allergies  Allergen Reactions  . Penicillins     Has patient had a PCN reaction causing immediate rash, facial/tongue/throat swelling, SOB or lightheadedness with hypotension: No Has patient had a PCN reaction causing severe rash involving mucus membranes or skin necrosis: No Has patient had a PCN reaction that required hospitalization No Has patient had a PCN reaction occurring within the last 10 years: No If all of the above answers are "NO", then may proceed with Cephalosporin use.    Medications: No prescriptions prior to admission.     Social History: Social History   Social History  . Marital status: Married    Spouse name: N/A  . Number of children: N/A  . Years of education: N/A   Occupational History  . Not on file.   Social History Main Topics  . Smoking status: Never Smoker  . Smokeless tobacco: Not on file  . Alcohol use Yes     Comment: 2 a day  . Drug use: No  . Sexual activity: Not on file   Other Topics Concern  . Not on file   Social History Narrative  . No narrative on file    Family History: Family History  Problem Relation Age of Onset  . Parkinsonism Father     Review of Systems: Positive: Erectile dysfunction, penile curvature with erections Negative:   A further 10 point review  of systems was negative except what is listed in the HPI.                  Physical Exam: @VITALS2 @ General: No acute distress.  Awake. Head:  Normocephalic.  Atraumatic. ENT:  EOMI.  Mucous membranes moist Neck:  Supple.  No lymphadenopathy. CV:  S1 present. S2 present. Regular rate. Pulmonary: Equal effort bilaterally.  Clear to auscultation bilaterally. Abdomen: Soft.  None tender to palpation. Skin:  Normal turgor.  No visible rash. Extremity: No gross deformity of bilateral upper extremities.  No gross  deformity of                             lower extremities. Neurologic: Alert. Appropriate mood Penis:  circumcised.  No lesions. Urethra: Orthotopic meatus. Scrotum: No lesions.  No ecchymosis.  No erythema. Testicles: Descended bilaterally.  No masses bilaterally. Epididymis: Palpable bilaterally. Nontender to palpation.  Studies:  No results for input(s): HGB, WBC, PLT in the last 72 hours.  No results for input(s): NA, K, CL, CO2, BUN, CREATININE, CALCIUM, GFRNONAA, GFRAA in the last 72 hours.  Invalid input(s): MAGNESIUM   No results for input(s): INR, APTT in the last 72 hours.  Invalid input(s): PT   Invalid input(s): ABG    Assessment:  Erectile dysfunction, organic. Peyronie's disease  Plan: Placement of inflatable penile prosthesis.  The procedure, risks and complications have been discussed with Cam at length.  These include but are not limited to infection with need to remove/replaced the prosthesis, mechanical malfunction, bleeding, persistent lateral curvature.  He understands these and desires to proceed.

## 2016-04-01 ENCOUNTER — Encounter (HOSPITAL_BASED_OUTPATIENT_CLINIC_OR_DEPARTMENT_OTHER): Payer: Self-pay | Admitting: *Deleted

## 2016-04-01 NOTE — Progress Notes (Signed)
NPO AFTER MN WITH EXCEPTION CLEAR LIQUIDS UNTIL 0730 (NO CREAM/MILK PRODUCTS).  ARRIVE AT 1215.  NEEDS ISTAT AND EKG.  WILL TAKE AM MEDS W/ SIPS OF WATER.

## 2016-04-03 ENCOUNTER — Ambulatory Visit (HOSPITAL_BASED_OUTPATIENT_CLINIC_OR_DEPARTMENT_OTHER): Payer: BLUE CROSS/BLUE SHIELD | Admitting: Anesthesiology

## 2016-04-03 ENCOUNTER — Observation Stay (HOSPITAL_BASED_OUTPATIENT_CLINIC_OR_DEPARTMENT_OTHER)
Admission: RE | Admit: 2016-04-03 | Discharge: 2016-04-04 | Disposition: A | Discharge status: Home / Self Care | Payer: BLUE CROSS/BLUE SHIELD | Source: Ambulatory Visit | Attending: Urology | Admitting: Urology

## 2016-04-03 ENCOUNTER — Encounter (HOSPITAL_BASED_OUTPATIENT_CLINIC_OR_DEPARTMENT_OTHER): Payer: Self-pay

## 2016-04-03 ENCOUNTER — Encounter (HOSPITAL_COMMUNITY)
Admission: RE | Disposition: A | Discharge status: Home / Self Care | Payer: Self-pay | Source: Ambulatory Visit | Attending: Urology

## 2016-04-03 DIAGNOSIS — N486 Induration penis plastica: Secondary | ICD-10-CM | POA: Diagnosis not present

## 2016-04-03 DIAGNOSIS — N529 Male erectile dysfunction, unspecified: Secondary | ICD-10-CM | POA: Diagnosis present

## 2016-04-03 DIAGNOSIS — K649 Unspecified hemorrhoids: Secondary | ICD-10-CM | POA: Diagnosis not present

## 2016-04-03 DIAGNOSIS — I10 Essential (primary) hypertension: Secondary | ICD-10-CM | POA: Diagnosis not present

## 2016-04-03 DIAGNOSIS — Z820 Family history of epilepsy and other diseases of the nervous system: Secondary | ICD-10-CM | POA: Insufficient documentation

## 2016-04-03 DIAGNOSIS — N528 Other male erectile dysfunction: Principal | ICD-10-CM | POA: Insufficient documentation

## 2016-04-03 DIAGNOSIS — M1990 Unspecified osteoarthritis, unspecified site: Secondary | ICD-10-CM | POA: Diagnosis not present

## 2016-04-03 DIAGNOSIS — Z880 Allergy status to penicillin: Secondary | ICD-10-CM | POA: Diagnosis not present

## 2016-04-03 DIAGNOSIS — K219 Gastro-esophageal reflux disease without esophagitis: Secondary | ICD-10-CM | POA: Diagnosis not present

## 2016-04-03 DIAGNOSIS — F329 Major depressive disorder, single episode, unspecified: Secondary | ICD-10-CM | POA: Diagnosis not present

## 2016-04-03 DIAGNOSIS — Z981 Arthrodesis status: Secondary | ICD-10-CM | POA: Insufficient documentation

## 2016-04-03 HISTORY — DX: Personal history of colonic polyps: Z86.010

## 2016-04-03 HISTORY — DX: Male erectile dysfunction, unspecified: N52.9

## 2016-04-03 HISTORY — DX: Personal history of adenomatous and serrated colon polyps: Z86.0101

## 2016-04-03 HISTORY — PX: PENILE PROSTHESIS IMPLANT: SHX240

## 2016-04-03 LAB — POCT I-STAT 4, (NA,K, GLUC, HGB,HCT)
GLUCOSE: 102 mg/dL — AB (ref 65–99)
HEMATOCRIT: 51 % (ref 39.0–52.0)
HEMOGLOBIN: 17.3 g/dL — AB (ref 13.0–17.0)
POTASSIUM: 3.7 mmol/L (ref 3.5–5.1)
SODIUM: 141 mmol/L (ref 135–145)

## 2016-04-03 SURGERY — INSERTION, PENILE PROSTHESIS, INFLATABLE
Anesthesia: General | Site: Penis

## 2016-04-03 MED ORDER — WATER FOR IRRIGATION, STERILE IR SOLN
Status: DC | PRN
  Administered 2016-04-03: 10 mL

## 2016-04-03 MED ORDER — OXYBUTYNIN CHLORIDE ER 5 MG PO TB24: 10.0000 mg | ORAL_TABLET | Freq: Every morning | ORAL | Status: DC

## 2016-04-03 MED ORDER — ONDANSETRON HCL 4 MG/2ML IJ SOLN
INTRAMUSCULAR | Status: DC | PRN
  Administered 2016-04-03: 4 mg via INTRAVENOUS

## 2016-04-03 MED ORDER — MIDAZOLAM HCL 2 MG/2ML IJ SOLN
INTRAMUSCULAR | Status: AC
  Filled 2016-04-03: qty 2

## 2016-04-03 MED ORDER — BUPIVACAINE HCL (PF) 0.25 % IJ SOLN
INTRAMUSCULAR | Status: AC
  Filled 2016-04-03: qty 30

## 2016-04-03 MED ORDER — DOCUSATE SODIUM 100 MG PO CAPS
100.0000 mg | ORAL_CAPSULE | Freq: Two times a day (BID) | ORAL | Status: DC
  Administered 2016-04-03 – 2016-04-04 (×2): 100 mg via ORAL
  Filled 2016-04-03 (×2): qty 1

## 2016-04-03 MED ORDER — ATORVASTATIN CALCIUM 10 MG PO TABS
20.0000 mg | ORAL_TABLET | Freq: Every morning | ORAL | Status: DC
  Administered 2016-04-04: 20 mg via ORAL
  Filled 2016-04-03: qty 2

## 2016-04-03 MED ORDER — SODIUM CHLORIDE 0.9 % IR SOLN
Status: DC | PRN
  Administered 2016-04-03: 500 mL

## 2016-04-03 MED ORDER — ONDANSETRON HCL 4 MG/2ML IJ SOLN
INTRAMUSCULAR | Status: AC
  Filled 2016-04-03: qty 2

## 2016-04-03 MED ORDER — SULFAMETHOXAZOLE-TRIMETHOPRIM 800-160 MG PO TABS
1.0000 | ORAL_TABLET | Freq: Two times a day (BID) | ORAL | Status: DC
  Administered 2016-04-03: 1 via ORAL
  Filled 2016-04-03: qty 1

## 2016-04-03 MED ORDER — PANTOPRAZOLE SODIUM 40 MG PO TBEC: 40.0000 mg | DELAYED_RELEASE_TABLET | Freq: Every day | ORAL | Status: DC

## 2016-04-03 MED ORDER — CIPROFLOXACIN IN D5W 400 MG/200ML IV SOLN
INTRAVENOUS | Status: AC
  Filled 2016-04-03: qty 200

## 2016-04-03 MED ORDER — FENTANYL CITRATE (PF) 100 MCG/2ML IJ SOLN
INTRAMUSCULAR | Status: AC
  Filled 2016-04-03: qty 2

## 2016-04-03 MED ORDER — HYDROMORPHONE HCL 2 MG PO TABS
2.0000 mg | ORAL_TABLET | ORAL | Status: DC | PRN
  Administered 2016-04-04: 2 mg via ORAL
  Filled 2016-04-03: qty 1

## 2016-04-03 MED ORDER — HYDROMORPHONE HCL 2 MG/ML IJ SOLN
0.5000 mg | INTRAMUSCULAR | Status: DC | PRN
  Administered 2016-04-03 – 2016-04-04 (×3): 0.5 mg via INTRAVENOUS
  Filled 2016-04-03 (×3): qty 1

## 2016-04-03 MED ORDER — ONDANSETRON HCL 4 MG/2ML IJ SOLN: 4.0000 mg | INTRAMUSCULAR | Status: DC | PRN

## 2016-04-03 MED ORDER — SENNA 8.6 MG PO TABS
1.0000 | ORAL_TABLET | Freq: Two times a day (BID) | ORAL | Status: DC
  Administered 2016-04-03: 8.6 mg via ORAL
  Filled 2016-04-03: qty 1

## 2016-04-03 MED ORDER — HYDROMORPHONE HCL 2 MG PO TABS: 2.0000 mg | ORAL_TABLET | ORAL | 0 refills | Status: DC | PRN

## 2016-04-03 MED ORDER — LACTATED RINGERS IV SOLN
INTRAVENOUS | Status: DC
  Administered 2016-04-03 (×3): via INTRAVENOUS
  Filled 2016-04-03: qty 1000

## 2016-04-03 MED ORDER — CEFAZOLIN IN D5W 1 GM/50ML IV SOLN
1.0000 g | Freq: Three times a day (TID) | INTRAVENOUS | Status: DC
  Filled 2016-04-03 (×3): qty 50

## 2016-04-03 MED ORDER — FENTANYL CITRATE (PF) 100 MCG/2ML IJ SOLN
25.0000 ug | INTRAMUSCULAR | Status: DC | PRN
  Administered 2016-04-03: 50 ug via INTRAVENOUS
  Filled 2016-04-03: qty 1

## 2016-04-03 MED ORDER — DEXAMETHASONE SODIUM PHOSPHATE 4 MG/ML IJ SOLN
INTRAMUSCULAR | Status: DC | PRN
  Administered 2016-04-03: 10 mg via INTRAVENOUS

## 2016-04-03 MED ORDER — PROPOFOL 10 MG/ML IV BOLUS
INTRAVENOUS | Status: DC | PRN
  Administered 2016-04-03: 200 mg via INTRAVENOUS
  Administered 2016-04-03: 50 mg via INTRAVENOUS
  Administered 2016-04-03: 100 mg via INTRAVENOUS

## 2016-04-03 MED ORDER — CIPROFLOXACIN IN D5W 400 MG/200ML IV SOLN
400.0000 mg | INTRAVENOUS | Status: AC
  Administered 2016-04-03: 400 mg via INTRAVENOUS
  Filled 2016-04-03: qty 200

## 2016-04-03 MED ORDER — BUPIVACAINE HCL (PF) 0.25 % IJ SOLN
INTRAMUSCULAR | Status: DC | PRN
  Administered 2016-04-03: 20 mL

## 2016-04-03 MED ORDER — PROPOFOL 10 MG/ML IV BOLUS
INTRAVENOUS | Status: AC
  Filled 2016-04-03: qty 20

## 2016-04-03 MED ORDER — INFLUENZA VAC SPLIT QUAD 0.5 ML IM SUSY
0.5000 mL | PREFILLED_SYRINGE | INTRAMUSCULAR | Status: AC
  Administered 2016-04-04: 0.5 mL via INTRAMUSCULAR
  Filled 2016-04-03: qty 0.5

## 2016-04-03 MED ORDER — BUPROPION HCL ER (XL) 300 MG PO TB24
300.0000 mg | ORAL_TABLET | Freq: Every morning | ORAL | Status: DC
  Administered 2016-04-04: 300 mg via ORAL
  Filled 2016-04-03: qty 1

## 2016-04-03 MED ORDER — DEXAMETHASONE SODIUM PHOSPHATE 10 MG/ML IJ SOLN
INTRAMUSCULAR | Status: AC
  Filled 2016-04-03: qty 1

## 2016-04-03 MED ORDER — MIDAZOLAM HCL 5 MG/5ML IJ SOLN
INTRAMUSCULAR | Status: DC | PRN
  Administered 2016-04-03: 2 mg via INTRAVENOUS

## 2016-04-03 MED ORDER — FINASTERIDE 5 MG PO TABS
5.0000 mg | ORAL_TABLET | Freq: Every day | ORAL | Status: DC
  Filled 2016-04-03: qty 1

## 2016-04-03 MED ORDER — LIDOCAINE 2% (20 MG/ML) 5 ML SYRINGE
INTRAMUSCULAR | Status: AC
  Filled 2016-04-03: qty 5

## 2016-04-03 MED ORDER — CEPHALEXIN 500 MG PO CAPS: 500.0000 mg | ORAL_CAPSULE | Freq: Three times a day (TID) | ORAL | 0 refills | Status: DC

## 2016-04-03 MED ORDER — LIDOCAINE HCL (CARDIAC) 20 MG/ML IV SOLN
INTRAVENOUS | Status: DC | PRN
  Administered 2016-04-03: 100 mg via INTRAVENOUS

## 2016-04-03 MED ORDER — EPHEDRINE SULFATE 50 MG/ML IJ SOLN
INTRAMUSCULAR | Status: DC | PRN
  Administered 2016-04-03 (×3): 10 mg via INTRAVENOUS

## 2016-04-03 MED ORDER — OXYCODONE-ACETAMINOPHEN 5-325 MG PO TABS: 1.0000 | ORAL_TABLET | ORAL | 0 refills | Status: DC | PRN

## 2016-04-03 MED ORDER — ACETAMINOPHEN 500 MG PO TABS
1000.0000 mg | ORAL_TABLET | Freq: Four times a day (QID) | ORAL | Status: DC
  Administered 2016-04-03 – 2016-04-04 (×3): 1000 mg via ORAL
  Filled 2016-04-03 (×4): qty 2

## 2016-04-03 MED ORDER — KCL IN DEXTROSE-NACL 20-5-0.45 MEQ/L-%-% IV SOLN
INTRAVENOUS | Status: DC
  Administered 2016-04-03 (×2): via INTRAVENOUS
  Filled 2016-04-03 (×2): qty 1000

## 2016-04-03 MED ORDER — IRBESARTAN 150 MG PO TABS
300.0000 mg | ORAL_TABLET | Freq: Every morning | ORAL | Status: DC
  Administered 2016-04-04: 300 mg via ORAL
  Filled 2016-04-03: qty 2

## 2016-04-03 MED ORDER — FENTANYL CITRATE (PF) 100 MCG/2ML IJ SOLN
INTRAMUSCULAR | Status: DC | PRN
  Administered 2016-04-03 (×5): 25 ug via INTRAVENOUS
  Administered 2016-04-03: 50 ug via INTRAVENOUS
  Administered 2016-04-03 (×5): 25 ug via INTRAVENOUS

## 2016-04-03 MED ORDER — FLUOXETINE HCL 20 MG PO CAPS
40.0000 mg | ORAL_CAPSULE | Freq: Every morning | ORAL | Status: DC
Start: 2016-04-04 — End: 2016-04-04
  Administered 2016-04-04: 40 mg via ORAL
  Filled 2016-04-03 (×2): qty 2

## 2016-04-03 MED ORDER — AMLODIPINE BESYLATE 5 MG PO TABS
5.0000 mg | ORAL_TABLET | Freq: Every morning | ORAL | Status: DC
  Administered 2016-04-04: 5 mg via ORAL
  Filled 2016-04-03: qty 1

## 2016-04-03 SURGICAL SUPPLY — 73 items
ADH SKN CLS APL DERMABOND .7 (GAUZE/BANDAGES/DRESSINGS) ×1
APL SKNCLS STERI-STRIP NONHPOA (GAUZE/BANDAGES/DRESSINGS) ×1
APPLICATOR COTTON TIP 6IN STRL (MISCELLANEOUS) IMPLANT
BAG DECANTER FOR FLEXI CONT (MISCELLANEOUS) ×1 IMPLANT
BAG URINE DRAINAGE (UROLOGICAL SUPPLIES) ×2 IMPLANT
BANDAGE CO FLEX L/F 2IN X 5YD (GAUZE/BANDAGES/DRESSINGS) IMPLANT
BENZOIN TINCTURE PRP APPL 2/3 (GAUZE/BANDAGES/DRESSINGS) ×1 IMPLANT
BLADE CLIPPER SURG (BLADE) ×1 IMPLANT
BLADE HEX COATED 2.75 (ELECTRODE) ×2 IMPLANT
BLADE SURG 15 STRL LF DISP TIS (BLADE) ×2 IMPLANT
BLADE SURG 15 STRL SS (BLADE) ×4
BNDG GAUZE ELAST 4 BULKY (GAUZE/BANDAGES/DRESSINGS) ×2 IMPLANT
BRUSH SCRUB EZ PLAIN DRY (MISCELLANEOUS) ×1 IMPLANT
CANISTER SUCTION 1200CC (MISCELLANEOUS) IMPLANT
CANISTER SUCTION 2500CC (MISCELLANEOUS) ×1 IMPLANT
CATH FOLEY 2WAY SLVR  5CC 16FR (CATHETERS) ×1
CATH FOLEY 2WAY SLVR 5CC 16FR (CATHETERS) ×1 IMPLANT
CLOTH BEACON ORANGE TIMEOUT ST (SAFETY) ×2 IMPLANT
COVER BACK TABLE 60X90IN (DRAPES) ×2 IMPLANT
COVER MAYO STAND STRL (DRAPES) ×4 IMPLANT
DERMABOND ADVANCED (GAUZE/BANDAGES/DRESSINGS) ×1
DERMABOND ADVANCED .7 DNX12 (GAUZE/BANDAGES/DRESSINGS) IMPLANT
DISSECTOR ROUND CHERRY 3/8 STR (MISCELLANEOUS) IMPLANT
DRAIN PENROSE 18X1/2 LTX STRL (DRAIN) ×1 IMPLANT
DRAPE LAPAROTOMY TRNSV 102X78 (DRAPE) ×2 IMPLANT
DRESSING TELFA ISLAND 4X8 (GAUZE/BANDAGES/DRESSINGS) ×2 IMPLANT
DRSG TEGADERM 4X4.75 (GAUZE/BANDAGES/DRESSINGS) ×2 IMPLANT
ELECT REM PT RETURN 9FT ADLT (ELECTROSURGICAL) ×2
ELECTRODE REM PT RTRN 9FT ADLT (ELECTROSURGICAL) ×1 IMPLANT
GLOVE BIO SURGEON STRL SZ8 (GLOVE) ×2 IMPLANT
GLOVE BIOGEL PI IND STRL 7.5 (GLOVE) IMPLANT
GLOVE BIOGEL PI INDICATOR 7.5 (GLOVE) ×2
GLOVE ECLIPSE 7.0 STRL STRAW (GLOVE) ×1 IMPLANT
GLOVE ECLIPSE 7.5 STRL STRAW (GLOVE) ×1 IMPLANT
GOWN STRL REUS W/ TWL LRG LVL3 (GOWN DISPOSABLE) ×1 IMPLANT
GOWN STRL REUS W/ TWL XL LVL3 (GOWN DISPOSABLE) ×1 IMPLANT
GOWN STRL REUS W/TWL LRG LVL3 (GOWN DISPOSABLE) ×6
GOWN STRL REUS W/TWL XL LVL3 (GOWN DISPOSABLE) ×2
HOLDER FOLEY CATH W/STRAP (MISCELLANEOUS) ×2 IMPLANT
KIT ROOM TURNOVER WOR (KITS) ×2 IMPLANT
KIT TITAN ASSEMBLY (Erectile Restoration) ×1 IMPLANT
KIT TITAN ASSEMBLY STANDARD (Erectile Restoration) ×1 IMPLANT
KIT TITAN ASSEMBLY STD (Erectile Restoration) IMPLANT
NEEDLE HYPO 22GX1.5 SAFETY (NEEDLE) ×1 IMPLANT
NS IRRIG 500ML POUR BTL (IV SOLUTION) ×2 IMPLANT
PACK BASIN DAY SURGERY FS (CUSTOM PROCEDURE TRAY) ×2 IMPLANT
PENCIL BUTTON HOLSTER BLD 10FT (ELECTRODE) ×2 IMPLANT
PLUG CATH AND CAP STER (CATHETERS) ×2 IMPLANT
PROS TITAN INFRA 0 ANG 20CM (Erectile Restoration) ×2 IMPLANT
PROSTHESIS TTN INFR 0 ANG 20CM (Erectile Restoration) IMPLANT
RESERVOIR 75CC LOCKOUT BIOFLEX (Erectile Restoration) ×1 IMPLANT
RETRACTOR STAY HOOK 5MM (MISCELLANEOUS) IMPLANT
RETRACTOR STERILE 25.8CMX11.3 (INSTRUMENTS) IMPLANT
RETRACTOR WILSON SYSTEM (INSTRUMENTS) ×1 IMPLANT
SPONGE LAP 4X18 X RAY DECT (DISPOSABLE) ×2 IMPLANT
STRIP CLOSURE SKIN 1/2X4 (GAUZE/BANDAGES/DRESSINGS) IMPLANT
SUPPORT SCROTAL LG STRP (MISCELLANEOUS) IMPLANT
SUPPORT SCROTAL MED ADLT STRP (MISCELLANEOUS) ×1 IMPLANT
SURGILUBE 2OZ TUBE FLIPTOP (MISCELLANEOUS) ×1 IMPLANT
SUT MNCRL AB 4-0 PS2 18 (SUTURE) ×2 IMPLANT
SUT VIC AB 2-0 UR5 27 (SUTURE) ×8 IMPLANT
SUT VIC AB 5-0 PS2 18 (SUTURE) IMPLANT
SUT VICRYL 0 27 CT2 27 ABS (SUTURE) ×2 IMPLANT
SYR 20CC LL (SYRINGE) ×2 IMPLANT
SYR 50ML LL SCALE MARK (SYRINGE) ×4 IMPLANT
SYR BULB IRRIGATION 50ML (SYRINGE) ×2 IMPLANT
SYR CONTROL 10ML LL (SYRINGE) ×1 IMPLANT
SYRINGE 10CC LL (SYRINGE) ×2 IMPLANT
TOWEL OR 17X24 6PK STRL BLUE (TOWEL DISPOSABLE) ×4 IMPLANT
TRAY DSU PREP LF (CUSTOM PROCEDURE TRAY) ×2 IMPLANT
TUBE CONNECTING 12X1/4 (SUCTIONS) ×2 IMPLANT
WATER STERILE IRR 500ML POUR (IV SOLUTION) ×2 IMPLANT
YANKAUER SUCT BULB TIP NO VENT (SUCTIONS) ×2 IMPLANT

## 2016-04-03 NOTE — Op Note (Signed)
PATIENT:  Jacob [name]  PRE-OPERATIVE DIAGNOSIS:  Organic erectile dysfunction  POST-OPERATIVE DIAGNOSIS:  Same  PROCEDURE:  Procedure(s): 3 piece inflatable penile prosthesis Surveyor, minerals)  SURGEON:  Lillette Boxer. Tery Hoeger, MD  INDICATION: He has had long-standing organic erectile dysfunction and has been treated with, unsuccessfully, with oral agents  and intracavernosal injection therapy without success. He has elected to proceed with prosthesis implantation.  ANESTHESIA:  General  EBL:  100 mL  Device: 3 piece Titan: 75 cc reservoir, 20 cm cylinders   LOCAL MEDICATIONS USED:  20 cc 0.25% plain marcaine  SPECIMEN: None  DISPOSITION OF SPECIMEN:  N/A  Description of procedure: The patient was taken to the major operating room, placed on the table and administered general anesthesia in the supine position. His genitalia was then scrubbed for 10 minutes, painted and then sterilely draped. An official timeout was then performed.  An 23 French Foley catheter was placed in the bladder following urethral irrigation with a weak betadine solution and the bladder was drained and the catheter was plugged. A midline infrapubic incision was then made and then blunt dissection was used to expose the corpus cavernosum on the right and left sides. This was further cleared of overlying tissue using sharp technique. 2-0 Vicryl sutures were then placed anteriorly in the corpus cavernosum to serve as stay sutures and then an incision was then made in the corpus cavernosum first on the right-hand side with the knife and then extended proximally and distally with the bovie ad extended with curved Mayo scissors. I then dilated the corpus cavernosum with the Mentor dilators starting at 10 and progressing to 12 proximally and distally. I then irrigated the corpus cavernosum with antibiotic solution and measured the distance proximally and distally from the stay suture and was found to be 20 centimeter --8  centimeters proximal and 12 distal.  I then turned my attention to the contralateral corpus cavernosum and placed my stay sutures, made my corporotomy and dilated the corpus cavernosum in an identical fashion. This was measured and also was found to be 8 cm proximally and  12 distally. It was irrigated with anastomotic solution as was the scrotum. I then chose an 20 cm set and these were prepped while I prepared the site for reservoir placement.  A small incision was made in the midline lower rectus fascia and carried into the space of Retzius, on the left side, with blunt dissection.  The reservoir, a 75 mL unit, was then placed in the space of Retzius.  It easily fit into this space.  The rectus fascia was then reapproximated using interrupted 2-0 Vicryl sutures.  At this point, the reservoir was filled with 70 mL of saline.  Attention was redirected to the corporotomies where the cylinders were then placed by first fixing the suture to the distal aspect of the right cylinder to a straight needle. This was then loaded on the [facility] inserter and passed through the corporotomy and distally. I then advanced the straight needle with the Furlow inserter out through the glans and this was grasped with a hemostat and pulled through the glans and the suture was secured with a hemostat. I then performed an identical maneuver on the contralateral side. After this was performed I irrigated both corpus cavernosum and inserted the distal portion of the cylinder through the corporotomies and pulled this to the end of the corpora with the suture. The proximal aspect with the rear-tip extender was then passed through the corporotomy and  into the seated position on each side. I then connected reservoir tubing to a syringe filled with sterile saline and inflated the device. I noted a good straight erection with both cylinders equidistant under the glans and no buckling of the cylinders. I therefore deflated the device and  closed the corporotomies with running 2-0 Vicryl suture. The cylinder was then connected to the pump after excising the excess tubing with appropriate shodded hemostats in place and then I used the supplied connectors to make the connection. I then again cycled the device with the pump and it cycled properly. I deflated the device and pumped it up about one half of the way to aid with hemostasis. I irrigated the scrotum  with antibiotic solution.  I then developed the scrotal pocket in the right hemiscrotum in place the pump in this location. I irrigated the wound one last time with antibiotic irrigation and then closed the deep scrotal tissue over the tubing and pump with running 0 Vicryl suture. A second layer was then closed over this first layer with running 2-0 Vicryl and then the skin was closed with a 4-0 Monocryl placed in running subcuticular fashion.  The incision was then covered with Dermabond.  I then wrapped the scrotum and penis with a Curlex. The catheter was connected to closed system drainage and the patient was awakened and taken recovery room in stable and satisfactory condition. He tolerated the procedure well and there were no intraoperative complications. Needle sponge and instrument counts were correct at the end of the operation.

## 2016-04-03 NOTE — Anesthesia Preprocedure Evaluation (Signed)
Anesthesia Evaluation  Patient identified by MRN, date of birth, ID band Patient awake    Reviewed: Allergy & Precautions, H&P , Patient's Chart, lab work & pertinent test results, reviewed documented beta blocker date and time   Airway Mallampati: II  TM Distance: >3 FB Neck ROM: full    Dental no notable dental hx.    Pulmonary    Pulmonary exam normal breath sounds clear to auscultation       Cardiovascular hypertension,  Rhythm:regular Rate:Normal     Neuro/Psych    GI/Hepatic GERD  ,  Endo/Other    Renal/GU      Musculoskeletal   Abdominal   Peds  Hematology   Anesthesia Other Findings   Reproductive/Obstetrics                             Anesthesia Physical Anesthesia Plan  ASA: II  Anesthesia Plan: General   Post-op Pain Management:    Induction: Intravenous  Airway Management Planned: LMA  Additional Equipment:   Intra-op Plan:   Post-operative Plan:   Informed Consent: I have reviewed the patients History and Physical, chart, labs and discussed the procedure including the risks, benefits and alternatives for the proposed anesthesia with the patient or authorized representative who has indicated his/her understanding and acceptance.   Dental Advisory Given and Dental advisory given  Plan Discussed with: CRNA and Surgeon  Anesthesia Plan Comments: (Discussed GA with LMA, possible sore throat, potential need to switch to ETT, N/V, pulmonary aspiration. Questions answered. )        Anesthesia Quick Evaluation

## 2016-04-03 NOTE — Anesthesia Procedure Notes (Addendum)
Procedure Name: LMA Insertion Date/Time: 04/03/2016 1:42 PM Performed by: Justice Rocher Pre-anesthesia Checklist: Patient identified, Emergency Drugs available, Suction available and Patient being monitored Patient Re-evaluated:Patient Re-evaluated prior to inductionOxygen Delivery Method: Circle system utilized Preoxygenation: Pre-oxygenation with 100% oxygen Intubation Type: IV induction Ventilation: Mask ventilation without difficulty LMA: LMA inserted LMA Size: 5.0 Number of attempts: 2 Airway Equipment and Method: Bite block (soft rolled gauze oral bite block) Placement Confirmation: positive ETCO2 and breath sounds checked- equal and bilateral Tube secured with: Tape Dental Injury: Teeth and Oropharynx as per pre-operative assessment  Comments: LMA # 4 placed, difficult to seat, replaced with # 5 LMA BBS equal, better fit.

## 2016-04-03 NOTE — Transfer of Care (Signed)
Immediate Anesthesia Transfer of Care Note  Patient: Jacob [name]  Procedure(s) Performed: Procedure(s) (LRB): PENILE PROTHESIS INFLATABLE (N/A)  Patient Location: PACU  Anesthesia Type: General  Level of Consciousness: awake, sedated, patient cooperative and responds to stimulation  Airway & Oxygen Therapy: Patient Spontanous Breathing and Patient connected to face [place] oxygen  Post-op Assessment: Report given to PACU RN, Post -op Vital signs reviewed and stable and Patient moving all extremities  Post vital signs: Reviewed and stable  Complications: No apparent anesthesia complications

## 2016-04-04 ENCOUNTER — Encounter (HOSPITAL_BASED_OUTPATIENT_CLINIC_OR_DEPARTMENT_OTHER): Payer: Self-pay | Admitting: Urology

## 2016-04-04 DIAGNOSIS — N528 Other male erectile dysfunction: Secondary | ICD-10-CM | POA: Diagnosis not present

## 2016-04-04 LAB — BASIC METABOLIC PANEL
Anion gap: 7 (ref 5–15)
BUN: 11 mg/dL (ref 6–20)
CALCIUM: 8.8 mg/dL — AB (ref 8.9–10.3)
CHLORIDE: 104 mmol/L (ref 101–111)
CO2: 28 mmol/L (ref 22–32)
CREATININE: 1.03 mg/dL (ref 0.61–1.24)
Glucose, Bld: 166 mg/dL — ABNORMAL HIGH (ref 65–99)
Potassium: 4.4 mmol/L (ref 3.5–5.1)
SODIUM: 139 mmol/L (ref 135–145)

## 2016-04-04 LAB — HEMOGLOBIN AND HEMATOCRIT, BLOOD
HEMATOCRIT: 47 % (ref 39.0–52.0)
HEMOGLOBIN: 16.1 g/dL (ref 13.0–17.0)

## 2016-04-04 MED ORDER — SULFAMETHOXAZOLE-TRIMETHOPRIM 800-160 MG PO TABS: 1.0000 | ORAL_TABLET | Freq: Two times a day (BID) | ORAL | 0 refills | Status: DC

## 2016-04-04 MED ORDER — PROMETHAZINE HCL 25 MG PO TABS: 25.0000 mg | ORAL_TABLET | Freq: Four times a day (QID) | ORAL | 0 refills | Status: DC | PRN

## 2016-04-04 MED ORDER — PROMETHAZINE HCL 25 MG RE SUPP: 25.0000 mg | Freq: Four times a day (QID) | RECTAL | 0 refills | Status: DC | PRN

## 2016-04-04 NOTE — Anesthesia Postprocedure Evaluation (Signed)
Anesthesia Post Note  Patient: Jacob [name]  Procedure(s) Performed: Procedure(s) (LRB): PENILE PROTHESIS INFLATABLE (N/A)  Patient location during evaluation: PACU Anesthesia Type: General Level of consciousness: sedated Pain management: satisfactory to patient Vital Signs Assessment: post-procedure vital signs reviewed and stable Respiratory status: spontaneous breathing Cardiovascular status: stable Anesthetic complications: no        Last Vitals:  Vitals:   04/04/16 0130 04/04/16 0505  BP: 114/78 115/79  Pulse: 72 73  Resp: 18 18  Temp: 36.6 C 36.5 C    Last Pain:  Vitals:   04/04/16 0929  TempSrc:   PainSc: 4    Pain Goal: Patients Stated Pain Goal: 2 (04/04/16 0929)               Riccardo Dubin

## 2016-04-04 NOTE — Progress Notes (Signed)
Pt discharged to home, instruction reviewed, acknowledged understanding. Pain med given 20 minutes prior to discharged, upon discharged, pain improving, Level 4. SRP, RN

## 2016-04-04 NOTE — Discharge Summary (Signed)
Date of admission: 04/03/2016  Date of discharge: 04/04/2016  Admission diagnosis: Erectile Dysfunction  Discharge diagnosis: Erectile Dysfunction  Secondary diagnoses: none  History and Physical: For full details, please see admission history and physical. Briefly, Jacob [name] is a 66 y.o. year old patient with erectile dysfunction who desired IPP placement.   Hospital Course: 66 yo male who is s/p [facility] IPP placement with Dr. Diona Fanti on 04/03/16. He did well post-operatively. His diet was slowly advanced and at the time of discharge he was tolerating a regular diet, ambulating at his baseline, was voiding spontaneously after foley catheter removal, and pain was well controlled with oral narcotics. He was discharged to home on POD#1.   Laboratory values:  Recent Labs  04/03/16 1314 04/04/16 0528  HGB 17.3* 16.1  HCT 51.0 47.0    Recent Labs  04/04/16 0528  CREATININE 1.03    Disposition: Home  Discharge instruction: The patient was instructed to be ambulatory but told to refrain from heavy lifting, strenuous activity, or driving.    Discharge medications:  Allergies as of 04/04/2016      Reactions   Penicillins Other (See Comments)   Unknown childhood reaction Has patient had aCN reaction causing immediate rash, facial/tongue/throat swelling, SOB or lightheadedness with hypotension: No Has patient had a PCN reaction causing severe rash involving mucus membranes or skin necrosis: No Has patient had a PCN reaction that required hospitalization No Has patient had a PCN reaction occurring within the last 10 years: No If all of the above answers are "NO", then may proceed with Cephalosporin use.      Medication List    TAKE these medications   amLODipine 5 MG tablet Commonly known as:  NORVASC Take 5 mg by mouth every morning.   atorvastatin 20 MG tablet Commonly known as:  LIPITOR Take 20 mg by mouth every morning.   buPROPion 300 MG 24 hr tablet Commonly  known as:  WELLBUTRIN XL Take 300 mg by mouth every morning.   finasteride 5 MG tablet Commonly known as:  PROSCAR Take 5 mg by mouth daily.   FLUoxetine 40 MG capsule Commonly known as:  PROZAC Take 40 mg by mouth every morning.   HYDROmorphone 2 MG tablet Commonly known as:  DILAUDID Take 1 tablet (2 mg total) by mouth every 4 (four) hours as needed for severe pain.   irbesartan 300 MG tablet Commonly known as:  AVAPRO Take 300 mg by mouth every morning.   omeprazole 40 MG capsule Commonly known as:  PRILOSEC Take 40 mg by mouth every morning.   oxybutynin 10 MG 24 hr tablet Commonly known as:  DITROPAN-XL Take 10 mg by mouth every morning.   promethazine 25 MG tablet Commonly known as:  PHENERGAN Take 1 tablet (25 mg total) by mouth every 6 (six) hours as needed (hiccups).   sulfamethoxazole-trimethoprim 800-160 MG tablet Commonly known as:  BACTRIM DS,SEPTRA DS Take 1 tablet by mouth 2 (two) times daily.   TESTOSTERONE IM Inject 1.5 mLs into the muscle every 14 (fourteen) days.       Followup: He will follow up for post op check and IPP teaching.

## 2016-09-19 NOTE — Anesthesia Postprocedure Evaluation (Signed)
Anesthesia Post Note  Patient: Jacob [name]  Procedure(s) Performed: Procedure(s) (LRB): PENILE PROTHESIS INFLATABLE (N/A)     Anesthesia Post Evaluation  Last Vitals:  Vitals:   04/04/16 0130 04/04/16 0505  BP: 114/78 115/79  Pulse: 72 73  Resp: 18 18  Temp: 36.6 C 36.5 C    Last Pain:  Vitals:   04/04/16 0929  TempSrc:   PainSc: 4                  Cyanne Delmar EDWARD

## 2016-09-19 NOTE — Addendum Note (Signed)
Addendum  created 09/19/16 1118 by Lyndle Herrlich, MD   Sign clinical note

## 2016-12-31 ENCOUNTER — Other Ambulatory Visit: Payer: Self-pay | Admitting: Neurological Surgery

## 2016-12-31 DIAGNOSIS — M542 Cervicalgia: Secondary | ICD-10-CM

## 2017-01-01 ENCOUNTER — Ambulatory Visit
Admission: RE | Admit: 2017-01-01 | Discharge: 2017-01-01 | Disposition: A | Discharge status: Home / Self Care | Payer: BLUE CROSS/BLUE SHIELD | Source: Ambulatory Visit | Attending: Neurological Surgery | Admitting: Neurological Surgery

## 2017-01-01 DIAGNOSIS — M542 Cervicalgia: Secondary | ICD-10-CM

## 2017-02-05 ENCOUNTER — Encounter (HOSPITAL_BASED_OUTPATIENT_CLINIC_OR_DEPARTMENT_OTHER): Payer: Self-pay | Admitting: *Deleted

## 2017-02-05 ENCOUNTER — Other Ambulatory Visit: Payer: Self-pay | Admitting: Orthopedic Surgery

## 2017-02-05 ENCOUNTER — Encounter (HOSPITAL_BASED_OUTPATIENT_CLINIC_OR_DEPARTMENT_OTHER)
Admission: RE | Admit: 2017-02-05 | Discharge: 2017-02-05 | Disposition: A | Discharge status: Home / Self Care | Payer: BLUE CROSS/BLUE SHIELD | Source: Ambulatory Visit | Attending: Orthopedic Surgery | Admitting: Orthopedic Surgery

## 2017-02-05 DIAGNOSIS — Z820 Family history of epilepsy and other diseases of the nervous system: Secondary | ICD-10-CM | POA: Diagnosis not present

## 2017-02-05 DIAGNOSIS — Z86010 Personal history of colonic polyps: Secondary | ICD-10-CM | POA: Diagnosis not present

## 2017-02-05 DIAGNOSIS — F329 Major depressive disorder, single episode, unspecified: Secondary | ICD-10-CM | POA: Diagnosis not present

## 2017-02-05 DIAGNOSIS — I10 Essential (primary) hypertension: Secondary | ICD-10-CM | POA: Diagnosis not present

## 2017-02-05 DIAGNOSIS — Z888 Allergy status to other drugs, medicaments and biological substances status: Secondary | ICD-10-CM | POA: Diagnosis not present

## 2017-02-05 DIAGNOSIS — Z79899 Other long term (current) drug therapy: Secondary | ICD-10-CM | POA: Diagnosis not present

## 2017-02-05 DIAGNOSIS — Z96611 Presence of right artificial shoulder joint: Secondary | ICD-10-CM | POA: Diagnosis not present

## 2017-02-05 DIAGNOSIS — Z885 Allergy status to narcotic agent status: Secondary | ICD-10-CM | POA: Diagnosis not present

## 2017-02-05 DIAGNOSIS — M19041 Primary osteoarthritis, right hand: Secondary | ICD-10-CM | POA: Diagnosis present

## 2017-02-05 DIAGNOSIS — Z880 Allergy status to penicillin: Secondary | ICD-10-CM | POA: Diagnosis not present

## 2017-02-05 DIAGNOSIS — Z981 Arthrodesis status: Secondary | ICD-10-CM | POA: Diagnosis not present

## 2017-02-05 DIAGNOSIS — K219 Gastro-esophageal reflux disease without esophagitis: Secondary | ICD-10-CM | POA: Diagnosis not present

## 2017-02-05 DIAGNOSIS — I4510 Unspecified right bundle-branch block: Secondary | ICD-10-CM | POA: Diagnosis not present

## 2017-02-05 DIAGNOSIS — Z881 Allergy status to other antibiotic agents status: Secondary | ICD-10-CM | POA: Diagnosis not present

## 2017-02-05 NOTE — Progress Notes (Signed)
EKG reviewed by Dr. Turk, will proceed with surgery as scheduled. 

## 2017-02-06 ENCOUNTER — Ambulatory Visit (HOSPITAL_BASED_OUTPATIENT_CLINIC_OR_DEPARTMENT_OTHER)
Admission: RE | Admit: 2017-02-06 | Discharge: 2017-02-06 | Disposition: A | Discharge status: Home / Self Care | Payer: BLUE CROSS/BLUE SHIELD | Source: Ambulatory Visit | Attending: Orthopedic Surgery | Admitting: Orthopedic Surgery

## 2017-02-06 ENCOUNTER — Encounter (HOSPITAL_BASED_OUTPATIENT_CLINIC_OR_DEPARTMENT_OTHER)
Admission: RE | Disposition: A | Discharge status: Home / Self Care | Payer: Self-pay | Source: Ambulatory Visit | Attending: Orthopedic Surgery

## 2017-02-06 ENCOUNTER — Ambulatory Visit (HOSPITAL_BASED_OUTPATIENT_CLINIC_OR_DEPARTMENT_OTHER): Payer: BLUE CROSS/BLUE SHIELD | Admitting: Anesthesiology

## 2017-02-06 ENCOUNTER — Encounter (HOSPITAL_BASED_OUTPATIENT_CLINIC_OR_DEPARTMENT_OTHER): Payer: Self-pay | Admitting: Anesthesiology

## 2017-02-06 ENCOUNTER — Other Ambulatory Visit: Payer: Self-pay

## 2017-02-06 DIAGNOSIS — F329 Major depressive disorder, single episode, unspecified: Secondary | ICD-10-CM | POA: Insufficient documentation

## 2017-02-06 DIAGNOSIS — Z888 Allergy status to other drugs, medicaments and biological substances status: Secondary | ICD-10-CM | POA: Insufficient documentation

## 2017-02-06 DIAGNOSIS — M19041 Primary osteoarthritis, right hand: Secondary | ICD-10-CM | POA: Diagnosis not present

## 2017-02-06 DIAGNOSIS — I4510 Unspecified right bundle-branch block: Secondary | ICD-10-CM | POA: Insufficient documentation

## 2017-02-06 DIAGNOSIS — Z880 Allergy status to penicillin: Secondary | ICD-10-CM | POA: Insufficient documentation

## 2017-02-06 DIAGNOSIS — Z96611 Presence of right artificial shoulder joint: Secondary | ICD-10-CM | POA: Insufficient documentation

## 2017-02-06 DIAGNOSIS — Z885 Allergy status to narcotic agent status: Secondary | ICD-10-CM | POA: Insufficient documentation

## 2017-02-06 DIAGNOSIS — I10 Essential (primary) hypertension: Secondary | ICD-10-CM | POA: Insufficient documentation

## 2017-02-06 DIAGNOSIS — Z86010 Personal history of colonic polyps: Secondary | ICD-10-CM | POA: Insufficient documentation

## 2017-02-06 DIAGNOSIS — Z79899 Other long term (current) drug therapy: Secondary | ICD-10-CM | POA: Insufficient documentation

## 2017-02-06 DIAGNOSIS — K219 Gastro-esophageal reflux disease without esophagitis: Secondary | ICD-10-CM | POA: Insufficient documentation

## 2017-02-06 DIAGNOSIS — Z820 Family history of epilepsy and other diseases of the nervous system: Secondary | ICD-10-CM | POA: Insufficient documentation

## 2017-02-06 DIAGNOSIS — Z881 Allergy status to other antibiotic agents status: Secondary | ICD-10-CM | POA: Insufficient documentation

## 2017-02-06 DIAGNOSIS — Z981 Arthrodesis status: Secondary | ICD-10-CM | POA: Insufficient documentation

## 2017-02-06 HISTORY — PX: FINGER ARTHRODESIS: SHX5000

## 2017-02-06 SURGERY — FUSION, JOINT, FINGER
Anesthesia: General | Site: Finger | Laterality: Right

## 2017-02-06 MED ORDER — SCOPOLAMINE 1 MG/3DAYS TD PT72: 1.0000 | MEDICATED_PATCH | Freq: Once | TRANSDERMAL | Status: DC | PRN

## 2017-02-06 MED ORDER — CEFAZOLIN SODIUM-DEXTROSE 2-3 GM-%(50ML) IV SOLR
INTRAVENOUS | Status: DC | PRN
  Administered 2017-02-06: 2 g via INTRAVENOUS

## 2017-02-06 MED ORDER — ONDANSETRON HCL 4 MG/2ML IJ SOLN
INTRAMUSCULAR | Status: DC | PRN
  Administered 2017-02-06: 4 mg via INTRAVENOUS

## 2017-02-06 MED ORDER — PROMETHAZINE HCL 25 MG/ML IJ SOLN: 6.2500 mg | INTRAMUSCULAR | Status: DC | PRN

## 2017-02-06 MED ORDER — PROPOFOL 10 MG/ML IV BOLUS
INTRAVENOUS | Status: AC
  Filled 2017-02-06: qty 20

## 2017-02-06 MED ORDER — 0.9 % SODIUM CHLORIDE (POUR BTL) OPTIME
TOPICAL | Status: DC | PRN
  Administered 2017-02-06: 100 mL

## 2017-02-06 MED ORDER — GLYCOPYRROLATE 0.2 MG/ML IV SOSY
PREFILLED_SYRINGE | INTRAVENOUS | Status: DC | PRN
  Administered 2017-02-06: .2 mg via INTRAVENOUS

## 2017-02-06 MED ORDER — MEPERIDINE HCL 25 MG/ML IJ SOLN: 6.2500 mg | INTRAMUSCULAR | Status: DC | PRN

## 2017-02-06 MED ORDER — LIDOCAINE 2% (20 MG/ML) 5 ML SYRINGE
INTRAMUSCULAR | Status: AC
  Filled 2017-02-06: qty 5

## 2017-02-06 MED ORDER — HYDROMORPHONE HCL 2 MG PO TABS: ORAL_TABLET | ORAL | 0 refills | Status: DC

## 2017-02-06 MED ORDER — PHENYLEPHRINE HCL 10 MG/ML IJ SOLN
INTRAMUSCULAR | Status: DC | PRN
  Administered 2017-02-06: 50 ug/min via INTRAVENOUS

## 2017-02-06 MED ORDER — BUPIVACAINE HCL (PF) 0.25 % IJ SOLN
INTRAMUSCULAR | Status: AC
  Filled 2017-02-06: qty 30

## 2017-02-06 MED ORDER — PROPOFOL 10 MG/ML IV BOLUS
INTRAVENOUS | Status: DC | PRN
  Administered 2017-02-06: 200 mg via INTRAVENOUS

## 2017-02-06 MED ORDER — PHENYLEPHRINE 40 MCG/ML (10ML) SYRINGE FOR IV PUSH (FOR BLOOD PRESSURE SUPPORT)
PREFILLED_SYRINGE | INTRAVENOUS | Status: DC | PRN
  Administered 2017-02-06 (×2): 80 ug via INTRAVENOUS

## 2017-02-06 MED ORDER — OXYCODONE HCL 5 MG PO TABS: 5.0000 mg | ORAL_TABLET | Freq: Once | ORAL | Status: DC | PRN

## 2017-02-06 MED ORDER — MIDAZOLAM HCL 2 MG/2ML IJ SOLN
INTRAMUSCULAR | Status: AC
  Filled 2017-02-06: qty 2

## 2017-02-06 MED ORDER — DEXAMETHASONE SODIUM PHOSPHATE 10 MG/ML IJ SOLN
INTRAMUSCULAR | Status: AC
  Filled 2017-02-06: qty 1

## 2017-02-06 MED ORDER — LACTATED RINGERS IV SOLN: INTRAVENOUS | Status: DC

## 2017-02-06 MED ORDER — ONDANSETRON HCL 4 MG/2ML IJ SOLN
INTRAMUSCULAR | Status: AC
  Filled 2017-02-06: qty 2

## 2017-02-06 MED ORDER — HYDROMORPHONE HCL 1 MG/ML IJ SOLN: 0.2500 mg | INTRAMUSCULAR | Status: DC | PRN

## 2017-02-06 MED ORDER — DEXAMETHASONE SODIUM PHOSPHATE 4 MG/ML IJ SOLN
INTRAMUSCULAR | Status: DC | PRN
  Administered 2017-02-06: 10 mg via INTRAVENOUS

## 2017-02-06 MED ORDER — VANCOMYCIN HCL IN DEXTROSE 1-5 GM/200ML-% IV SOLN: 1000.0000 mg | INTRAVENOUS | Status: DC

## 2017-02-06 MED ORDER — OXYCODONE HCL 5 MG/5ML PO SOLN: 5.0000 mg | Freq: Once | ORAL | Status: DC | PRN

## 2017-02-06 MED ORDER — LACTATED RINGERS IV SOLN
INTRAVENOUS | Status: DC
  Administered 2017-02-06 (×2): via INTRAVENOUS

## 2017-02-06 MED ORDER — CHLORHEXIDINE GLUCONATE 4 % EX LIQD: 60.0000 mL | Freq: Once | CUTANEOUS | Status: DC

## 2017-02-06 MED ORDER — MIDAZOLAM HCL 2 MG/2ML IJ SOLN
1.0000 mg | INTRAMUSCULAR | Status: DC | PRN
  Administered 2017-02-06: 2 mg via INTRAVENOUS

## 2017-02-06 MED ORDER — BUPIVACAINE HCL (PF) 0.25 % IJ SOLN
INTRAMUSCULAR | Status: DC | PRN
  Administered 2017-02-06: 10 mL

## 2017-02-06 MED ORDER — FENTANYL CITRATE (PF) 100 MCG/2ML IJ SOLN
INTRAMUSCULAR | Status: AC
  Filled 2017-02-06: qty 2

## 2017-02-06 MED ORDER — FENTANYL CITRATE (PF) 100 MCG/2ML IJ SOLN
50.0000 ug | INTRAMUSCULAR | Status: DC | PRN
  Administered 2017-02-06: 100 ug via INTRAVENOUS

## 2017-02-06 MED ORDER — CEFAZOLIN SODIUM-DEXTROSE 2-4 GM/100ML-% IV SOLN
INTRAVENOUS | Status: AC
  Filled 2017-02-06: qty 100

## 2017-02-06 MED ORDER — PHENYLEPHRINE 40 MCG/ML (10ML) SYRINGE FOR IV PUSH (FOR BLOOD PRESSURE SUPPORT)
PREFILLED_SYRINGE | INTRAVENOUS | Status: AC
  Filled 2017-02-06: qty 10

## 2017-02-06 SURGICAL SUPPLY — 63 items
BANDAGE ACE 3X5.8 VEL STRL LF (GAUZE/BANDAGES/DRESSINGS) ×1 IMPLANT
BIT DRILL 1.7 (BIT) ×2
BLADE MINI RND TIP GREEN BEAV (BLADE) ×2 IMPLANT
BLADE SURG 15 STRL LF DISP TIS (BLADE) ×2 IMPLANT
BLADE SURG 15 STRL SS (BLADE) ×4
BNDG CMPR 9X4 STRL LF SNTH (GAUZE/BANDAGES/DRESSINGS) ×1
BNDG COHESIVE 1X5 TAN STRL LF (GAUZE/BANDAGES/DRESSINGS) ×1 IMPLANT
BNDG ELASTIC 2X5.8 VLCR STR LF (GAUZE/BANDAGES/DRESSINGS) IMPLANT
BNDG ESMARK 4X9 LF (GAUZE/BANDAGES/DRESSINGS) ×2 IMPLANT
BNDG GAUZE ELAST 4 BULKY (GAUZE/BANDAGES/DRESSINGS) ×1 IMPLANT
BUR FAST CUTTING MED (BURR) IMPLANT
CHLORAPREP W/TINT 26ML (MISCELLANEOUS) ×2 IMPLANT
CORD BIPOLAR FORCEPS 12FT (ELECTRODE) ×2 IMPLANT
COVER BACK TABLE 60X90IN (DRAPES) ×2 IMPLANT
COVER MAYO STAND STRL (DRAPES) ×2 IMPLANT
CUFF TOURNIQUET SINGLE 18IN (TOURNIQUET CUFF) ×2 IMPLANT
DRAPE EXTREMITY T 121X128X90 (DRAPE) ×2 IMPLANT
DRAPE INCISE IOBAN 66X45 STRL (DRAPES) ×1 IMPLANT
DRAPE OEC MINIVIEW 54X84 (DRAPES) ×2 IMPLANT
DRAPE SURG 17X23 STRL (DRAPES) ×2 IMPLANT
DRILL SRG LNG 1.7X2XQR (BIT) IMPLANT
GAUZE SPONGE 4X4 12PLY STRL (GAUZE/BANDAGES/DRESSINGS) ×2 IMPLANT
GAUZE SPONGE 4X4 12PLY STRL LF (GAUZE/BANDAGES/DRESSINGS) ×1 IMPLANT
GAUZE XEROFORM 1X8 LF (GAUZE/BANDAGES/DRESSINGS) ×2 IMPLANT
GLOVE BIO SURGEON STRL SZ7.5 (GLOVE) ×2 IMPLANT
GLOVE BIOGEL PI IND STRL 7.0 (GLOVE) IMPLANT
GLOVE BIOGEL PI IND STRL 8 (GLOVE) ×1 IMPLANT
GLOVE BIOGEL PI IND STRL 8.5 (GLOVE) ×1 IMPLANT
GLOVE BIOGEL PI INDICATOR 7.0 (GLOVE) ×2
GLOVE BIOGEL PI INDICATOR 8 (GLOVE) ×1
GLOVE BIOGEL PI INDICATOR 8.5 (GLOVE) ×1
GLOVE ECLIPSE 6.5 STRL STRAW (GLOVE) ×1 IMPLANT
GLOVE SURG ORTHO 8.0 STRL STRW (GLOVE) ×1 IMPLANT
GLOVE SURG SS PI 8.5 STRL IVOR (GLOVE) ×1
GLOVE SURG SS PI 8.5 STRL STRW (GLOVE) IMPLANT
GOWN STRL REUS W/ TWL LRG LVL3 (GOWN DISPOSABLE) ×1 IMPLANT
GOWN STRL REUS W/ TWL XL LVL3 (GOWN DISPOSABLE) IMPLANT
GOWN STRL REUS W/TWL LRG LVL3 (GOWN DISPOSABLE)
GOWN STRL REUS W/TWL XL LVL3 (GOWN DISPOSABLE) ×5 IMPLANT
K-WIRE 0.35X4 (WIRE) ×2
KWIRE 0.35X4 (WIRE) IMPLANT
NS IRRIG 1000ML POUR BTL (IV SOLUTION) ×2 IMPLANT
PACK BASIN DAY SURGERY FS (CUSTOM PROCEDURE TRAY) ×2 IMPLANT
PAD CAST 3X4 CTTN HI CHSV (CAST SUPPLIES) ×1 IMPLANT
PADDING CAST ABS 3INX4YD NS (CAST SUPPLIES)
PADDING CAST ABS 4INX4YD NS (CAST SUPPLIES) ×1
PADDING CAST ABS COTTON 3X4 (CAST SUPPLIES) IMPLANT
PADDING CAST ABS COTTON 4X4 ST (CAST SUPPLIES) ×1 IMPLANT
PADDING CAST COTTON 3X4 STRL (CAST SUPPLIES)
SCREW HEADLESS CANN 2.0X32MM (Screw) ×1 IMPLANT
SCREW HEADLESS STRL 2.0X28MM (Screw) ×1 IMPLANT
SLEEVE SCD COMPRESS KNEE MED (MISCELLANEOUS) ×1 IMPLANT
SPLINT FINGER 3.25 911903 (SOFTGOODS) ×1 IMPLANT
SPLINT PLASTER CAST XFAST 3X15 (CAST SUPPLIES) IMPLANT
SPLINT PLASTER XTRA FASTSET 3X (CAST SUPPLIES)
STOCKINETTE 4X48 STRL (DRAPES) ×2 IMPLANT
SUT ETHILON 3 0 PS 1 (SUTURE) IMPLANT
SUT ETHILON 4 0 PS 2 18 (SUTURE) ×1 IMPLANT
SUT MERSILENE 4 0 P 3 (SUTURE) ×1 IMPLANT
SYR BULB 3OZ (MISCELLANEOUS) ×2 IMPLANT
SYR CONTROL 10ML LL (SYRINGE) ×1 IMPLANT
TOWEL OR 17X24 6PK STRL BLUE (TOWEL DISPOSABLE) ×3 IMPLANT
UNDERPAD 30X30 (UNDERPADS AND DIAPERS) ×2 IMPLANT

## 2017-02-06 NOTE — Discharge Instructions (Addendum)

## 2017-02-06 NOTE — Transfer of Care (Signed)
Immediate Anesthesia Transfer of Care Note  Patient: Jacob [name]  Procedure(s) Performed: ARTHRODESIS RIGHT INDEX FINGER DISTAL INTERPHALANGEAL JOINT (Right Finger)  Patient Location: PACU  Anesthesia Type:General  Level of Consciousness: awake and sedated  Airway & Oxygen Therapy: Patient Spontanous Breathing and Patient connected to face mask oxygen  Post-op Assessment: Report given to RN and Post -op Vital signs reviewed and stable  Post vital signs: Reviewed and stable  Last Vitals:  Vitals:   02/06/17 1233  BP: (!) 137/95  Pulse: 76  Resp: 18  Temp: 36.6 C  SpO2: 97%    Last Pain:  Vitals:   02/06/17 1233  TempSrc: Oral      Patients Stated Pain Goal: 0 (40/34/74 2595)  Complications: No apparent anesthesia complications

## 2017-02-06 NOTE — Anesthesia Procedure Notes (Signed)
Procedure Name: LMA Insertion Date/Time: 02/06/2017 1:33 PM Performed by: Lyndee Leo, CRNA Pre-anesthesia Checklist: Patient identified, Emergency Drugs available, Suction available and Patient being monitored Patient Re-evaluated:Patient Re-evaluated prior to induction Oxygen Delivery Method: Circle system utilized Preoxygenation: Pre-oxygenation with 100% oxygen Induction Type: IV induction Ventilation: Mask ventilation without difficulty LMA: LMA inserted LMA Size: 4.0 Number of attempts: 1 Airway Equipment and Method: Bite block Placement Confirmation: positive ETCO2 Tube secured with: Tape Dental Injury: Teeth and Oropharynx as per pre-operative assessment

## 2017-02-06 NOTE — H&P (Signed)
Jacob [name] is an 66 y.o. male.   Chief Complaint: right index finger dip arthritis HPI: 66 yo male with arthritis right index finger dip joint.  This is painful and bothersome.  He wishes to have the joint fused.  Allergies:  Allergies  Allergen Reactions  . Vancomycin     Hypotension   . Hydrocodone Other (See Comments)    "jumps out of skin"  . Penicillins Other (See Comments)    Unknown childhood reaction  Has patient had aCN reaction causing immediate rash, facial/tongue/throat swelling, SOB or lightheadedness with hypotension: No Has patient had a PCN reaction causing severe rash involving mucus membranes or skin necrosis: No Has patient had a PCN reaction that required hospitalization No Has patient had a PCN reaction occurring within the last 10 years: No If all of the above answers are "NO", then may proceed with Cephalosporin use.  Marland Kitchen Percocet [Oxycodone-Acetaminophen] Other (See Comments)    "jumps out of skin"    Past Medical History:  Diagnosis Date  . Depression   . DJD (degenerative joint disease)   . ED (erectile dysfunction) of organic origin   . GERD (gastroesophageal reflux disease)   . Hemorrhoids   . History of adenomatous polyp of colon    12-11-2014  tubular adenoam, hyperplastic  . Hypertension     Past Surgical History:  Procedure Laterality Date  . ACHILLES TENDON REPAIR Right 11/2009   ruptured  . ANTERIOR CERVICAL DECOMP/DISCECTOMY FUSION  04/23/2009   C7 -- T1  . COLONOSCOPY WITH PROPOFOL N/A 12/11/2014   Procedure: COLONOSCOPY WITH PROPOFOL;  Surgeon: Garlan Fair, MD;  Location: [facility];  Service: Endoscopy;  Laterality: N/A;  . INGUINAL HERNIA REPAIR Bilateral left 03/18/2002/  right -- yrs ago  . PENILE PROSTHESIS IMPLANT N/A 04/03/2016   Procedure: PENILE PROTHESIS INFLATABLE;  Surgeon: Franchot Gallo, MD;  Location: [facility];  Service: Urology;  Laterality: N/A;  . SHOULDER ARTHROSCOPY W/ ROTATOR CUFF  REPAIR Right 2002 and 2012  . SHOULDER ARTHROSCOPY WITH ROTATOR CUFF REPAIR AND SUBACROMIAL DECOMPRESSION Right 05/06/2012   Procedure: SHOULDER ARTHROSCOPY WITH ROTATOR CUFF REPAIR AND SUBACROMIAL DECOMPRESSION;  Surgeon: Ninetta Lights, MD;  Location: [facility];  Service: Orthopedics;  Laterality: Right;  RIGHT SHOULDER ARTHROSCOPY  lysis of adhesions rotator cuff repair   . SHOULDER ARTHROSCOPY/  DEBRIDEMENT LABRUM AND ROTATOR CUFF PARTIAL TEAR/  RESECTION BURSA/  ACROMIOPLASTY/  CORACOACROMINAL LIGAMENT RELEASE/  DCR Left 12/17/2001  . SOFT TISSUE MASS EXCISION  06/25/2006   right forearm-- angiolipoma  . TONSILLECTOMY AND ADENOIDECTOMY  child  . TOTAL SHOULDER ARTHROPLASTY Right 02/04/2016    Family History: Family History  Problem Relation Age of Onset  . Parkinsonism Father     Social History:   reports that  has never smoked. he has never used smokeless tobacco. He reports that he does not drink alcohol or use drugs.  Medications: Medications Prior to Admission  Medication Sig Dispense Refill  . amLODipine (NORVASC) 5 MG tablet Take 5 mg by mouth every morning.     Marland Kitchen atorvastatin (LIPITOR) 20 MG tablet Take 20 mg by mouth every morning.     Marland Kitchen buPROPion (WELLBUTRIN XL) 300 MG 24 hr tablet Take 300 mg by mouth every morning.     Marland Kitchen FLUoxetine (PROZAC) 40 MG capsule Take 40 mg by mouth every morning.     . irbesartan (AVAPRO) 300 MG tablet Take 300 mg by mouth every morning.     Marland Kitchen  omeprazole (PRILOSEC) 40 MG capsule Take 40 mg by mouth every morning.     Marland Kitchen oxybutynin (DITROPAN-XL) 10 MG 24 hr tablet Take 10 mg by mouth every morning.     . TESTOSTERONE IM Inject 1.5 mLs into the muscle every 14 (fourteen) days.      No results found for this or any previous visit (from the past 48 hour(s)).  No results found.   A comprehensive review of systems was negative.  Blood pressure (!) 137/95, pulse 76, temperature 97.8 F (36.6 C), temperature source Oral, resp.  rate 18, height 5\' 7"  (1.702 m), weight 84.8 kg (187 lb), SpO2 97 %.  General appearance: alert, cooperative and appears stated age Head: Normocephalic, without obvious abnormality, atraumatic Neck: supple, symmetrical, trachea midline Resp: clear to auscultation bilaterally Cardio: regular rate and rhythm GI: non-tender Extremities: Intact sensation and capillary refill all digits.  +epl/fpl/io.  No wounds.  Pulses: 2+ and symmetric Skin: Skin color, texture, turgor normal. No rashes or lesions Neurologic: Grossly normal Incision/Wound:none  Assessment/Plan Right index finger dip joint arthritis.  Non operative and operative treatment options were discussed with the patient and patient wishes to proceed with operative treatment. Risks, benefits, and alternatives of surgery were discussed and the patient agrees with the plan of care.   Jacob [name] R 02/06/2017, 1:16 PM

## 2017-02-06 NOTE — Op Note (Signed)
[id number] 

## 2017-02-06 NOTE — Anesthesia Preprocedure Evaluation (Addendum)
Anesthesia Evaluation  Patient identified by MRN, date of birth, ID band Patient awake    Reviewed: Allergy & Precautions, NPO status , Patient's Chart, lab work & pertinent test results  Airway Mallampati: I  TM Distance: >3 FB Neck ROM: Full    Dental  (+) Teeth Intact, Dental Advisory Given   Pulmonary neg pulmonary ROS,    breath sounds clear to auscultation       Cardiovascular hypertension, Pt. on medications  Rhythm:Regular Rate:Normal     Neuro/Psych PSYCHIATRIC DISORDERS Depression negative neurological ROS     GI/Hepatic Neg liver ROS, GERD  Medicated,  Endo/Other  negative endocrine ROS  Renal/GU negative Renal ROS     Musculoskeletal  (+) Arthritis ,   Abdominal Normal abdominal exam  (+)   Peds  Hematology negative hematology ROS (+)   Anesthesia Other Findings   Reproductive/Obstetrics negative OB ROS                            Lab Results  Component Value Date   WBC 8.8 07/17/2010   HGB 16.1 04/04/2016   HCT 47.0 04/04/2016   MCV 89.5 07/17/2010   PLT 238 07/17/2010   Lab Results  Component Value Date   CREATININE 1.03 04/04/2016   BUN 11 04/04/2016   NA 139 04/04/2016   K 4.4 04/04/2016   CL 104 04/04/2016   CO2 28 04/04/2016   No results found for: INR, PROTIME  EKG: normal sinus rhythm.  Anesthesia Physical Anesthesia Plan  ASA: II  Anesthesia Plan: General   Post-op Pain Management:    Induction: Intravenous  PONV Risk Score and Plan: 3 and Ondansetron, Dexamethasone and Midazolam  Airway Management Planned: LMA  Additional Equipment:   Intra-op Plan:   Post-operative Plan: Extubation in OR  Informed Consent: I have reviewed the patients History and Physical, chart, labs and discussed the procedure including the risks, benefits and alternatives for the proposed anesthesia with the patient or authorized representative who has indicated  his/her understanding and acceptance.   Dental advisory given  Plan Discussed with: CRNA  Anesthesia Plan Comments:         Anesthesia Quick Evaluation

## 2017-02-06 NOTE — Brief Op Note (Signed)
02/06/2017  2:44 PM  PATIENT:  Jacob [name]  65 y.o. male  PRE-OPERATIVE DIAGNOSIS:  OSTEOARTHRITIC DEGENERATION OF DISTAL INTERPHALANGEAL JOINT RIGHT INDEX FINGER  POST-OPERATIVE DIAGNOSIS:  OSTEOARTHRITIC DEGENERATION OF DISTAL INTERPHALANGEAL JOINT RIGHT INDEX FINGER  PROCEDURE:  Procedure(s): ARTHRODESIS RIGHT INDEX FINGER DISTAL INTERPHALANGEAL JOINT (Right)  SURGEON:  Surgeon(s) and Role:    * Leanora Cover, MD - Primary    * Daryll Brod, MD - Assisting  PHYSICIAN ASSISTANT:   ASSISTANTS: Daryll Brod, MD   ANESTHESIA:   general  EBL:  5 mL   BLOOD ADMINISTERED:none  DRAINS: none   LOCAL MEDICATIONS USED:  MARCAINE     SPECIMEN:  No Specimen  DISPOSITION OF SPECIMEN:  N/A  COUNTS:  YES  TOURNIQUET:   Total Tourniquet Time Documented: Upper Arm (Right) - 55 minutes Total: Upper Arm (Right) - 55 minutes   DICTATION: .Other Dictation: Dictation Number [id number]  PLAN OF CARE: Discharge to home after PACU  PATIENT DISPOSITION:  PACU - hemodynamically stable.

## 2017-02-07 NOTE — Op Note (Signed)
Jacob [name], WENRICK NO.:  [id number]  MEDICAL RECORD NO.:  [id number]  LOCATION:                                 FACILITY:  PHYSICIAN:  Leanora Cover, MD             DATE OF BIRTH:  DATE OF PROCEDURE:  02/06/2017 DATE OF DISCHARGE:                              OPERATIVE REPORT   PREOPERATIVE DIAGNOSIS:  Right index finger distal interphalangeal joint arthritis.  POSTOPERATIVE DIAGNOSIS:  Right index finger distal interphalangeal joint arthritis.  PROCEDURE:  Arthrodesis right index finger DIP joint.  SURGEON:  Leanora Cover, MD.  ASSISTANT:  Daryll Brod, MD.  ANESTHESIA:  General.  IV FLUIDS:  Per anesthesia flow sheet.  ESTIMATED BLOOD LOSS:  Minimal.  COMPLICATIONS:  None.  SPECIMENS:  None.  TOURNIQUET TIME:  55 minutes.  DISPOSITION:  Stable to PACU.  INDICATIONS:  Mr. Lodge is a 66 year old male, who was had pain and nodularity of the DIP joint of the right index finger.  Radiographs were taken showing degenerative arthritis.  He wishes to undergo fusion of the DIP joint.  Risks, benefits, and alternatives of surgery were discussed including the risk of blood loss; infection; damage to nerves, vessels, tendons, ligaments, bone; failure of surgery; need for additional surgery; complications with wound healing; continued pain; nonunion; malunion; and stiffness.  He voiced understanding of these risks, elected to proceed.  OPERATIVE COURSE:  After being identified preoperatively by myself, the patient and I agreed upon procedure and site of procedure.  Surgical site was marked.  Risks, benefits, and alternatives of surgery were reviewed and he wished to proceed.  Surgical consent had been signed. He was given IV Ancef as preoperative antibiotic prophylaxis.  He was transferred to the operating room and placed on the operating room table in supine position with the right upper extremity on arm board.  General anesthesia was induced by  anesthesiologist.  The right upper extremity was prepped and draped in normal sterile orthopedic fashion.  Surgical pause was performed between surgeons, Anesthesia, and operating room staff; and all were in agreement as to the patient, procedure and site of procedure.  Tourniquet at the proximal aspect of the extremity was inflated to 250 mmHg after exsanguination of the limb with an Esmarch bandage.  An S-shaped incision was made over the DIP joint of the right index finger.  This was carried into subcutaneous tissues by spreading technique.  The extensor tendon was released.  The dorsal osteophytes, which were taken down with the rongeurs.  The collateral ligaments were released.  The condyles of the middle phalanx were prepared with the rongeurs.  The distal phalanx was then also prepared with the rongeurs. This was used to take down the subarticular bone to bleeding cancellous bone.  A guidepin from the Osteomed set was then advanced antegrade through the distal phalanx and then retrograded back into the middle phalanx.  C-arm was used in AP and lateral projections to ensure appropriate reduction and position of hardware, which was the case.  A 28 mm screw was then used.  This was a headless compression screw.  This was bent  from the tip of the finger across the fusion site.  A small incision was made at the tip of the finger.  The screw had good purchase.  The C-arm was then used in AP and lateral projections to ensure appropriate reduction and position of hardware which was the case.  Some cancellous bone had been removed during preparation, was then placed into the fusion site after irrigation of the wound with sterile saline.  The extensor tendon was tacked down over the site using a 4-0 Mersilene suture.  The skin was closed with 4-0 nylon in a horizontal mattress fashion.  A digital block had been performed and 10 mL of 0.25% plain Marcaine to aid in postoperative analgesia.   The rotation of the distal phalanx was checked to ensure was in alignment with the adjacent digit, which was the case.  The wound was dressed with sterile Xeroform, 4x4, and wrapped with a Coban dressing lightly.  An AlumaFoam splint was placed and wrapped lightly with Coban dressing. Tourniquet was deflated at 55 minutes.  Fingertips were pink with brisk capillary refill after deflation of tourniquet.  Operative drapes were broken down and the patient was awoken from anesthesia safely.  He was transferred back to stretcher and taken to PACU in stable condition.  I will see him back in the office in 1 week for postoperative followup.  I will give him a prescription for Dilaudid p.o. 2 mg one to two p.o. q.6 hours p.r.n. pain, dispensed #30.  He states that hydrocodone and Percocet do not agree with him.     Leanora Cover, MD     KK/MEDQ  D:  02/06/2017  T:  02/07/2017  Job:  [id number]

## 2017-02-07 NOTE — Anesthesia Postprocedure Evaluation (Signed)
Anesthesia Post Note  Patient: Jacob [name]  Procedure(s) Performed: ARTHRODESIS RIGHT INDEX FINGER DISTAL INTERPHALANGEAL JOINT (Right Finger)     Patient location during evaluation: PACU Anesthesia Type: General Level of consciousness: awake and alert Pain management: pain level controlled Vital Signs Assessment: post-procedure vital signs reviewed and stable Respiratory status: spontaneous breathing, nonlabored ventilation, respiratory function stable and patient connected to nasal cannula oxygen Cardiovascular status: blood pressure returned to baseline and stable Postop Assessment: no apparent nausea or vomiting Anesthetic complications: no    Last Vitals:  Vitals:   02/06/17 1530 02/06/17 1558  BP: 129/85 128/89  Pulse: (!) 104 97  Resp: 13 16  Temp:  36.7 C  SpO2: 94% 95%    Last Pain:  Vitals:   02/06/17 1558  TempSrc:   PainSc: 0-No pain                 Effie Berkshire

## 2017-02-09 ENCOUNTER — Encounter (HOSPITAL_BASED_OUTPATIENT_CLINIC_OR_DEPARTMENT_OTHER): Payer: Self-pay | Admitting: Orthopedic Surgery

## 2017-12-16 DIAGNOSIS — C44519 Basal cell carcinoma of skin of other part of trunk: Secondary | ICD-10-CM | POA: Diagnosis not present

## 2017-12-16 DIAGNOSIS — Z85828 Personal history of other malignant neoplasm of skin: Secondary | ICD-10-CM | POA: Diagnosis not present

## 2018-05-18 DIAGNOSIS — H2513 Age-related nuclear cataract, bilateral: Secondary | ICD-10-CM | POA: Diagnosis not present

## 2018-05-18 DIAGNOSIS — Z85828 Personal history of other malignant neoplasm of skin: Secondary | ICD-10-CM | POA: Diagnosis not present

## 2018-05-18 DIAGNOSIS — H52222 Regular astigmatism, left eye: Secondary | ICD-10-CM | POA: Diagnosis not present

## 2018-05-18 DIAGNOSIS — L812 Freckles: Secondary | ICD-10-CM | POA: Diagnosis not present

## 2018-05-18 DIAGNOSIS — L821 Other seborrheic keratosis: Secondary | ICD-10-CM | POA: Diagnosis not present

## 2018-05-18 DIAGNOSIS — H5202 Hypermetropia, left eye: Secondary | ICD-10-CM | POA: Diagnosis not present

## 2018-05-18 DIAGNOSIS — D485 Neoplasm of uncertain behavior of skin: Secondary | ICD-10-CM | POA: Diagnosis not present

## 2018-05-18 DIAGNOSIS — H524 Presbyopia: Secondary | ICD-10-CM | POA: Diagnosis not present

## 2018-05-18 DIAGNOSIS — D1801 Hemangioma of skin and subcutaneous tissue: Secondary | ICD-10-CM | POA: Diagnosis not present

## 2018-05-18 DIAGNOSIS — H5211 Myopia, right eye: Secondary | ICD-10-CM | POA: Diagnosis not present

## 2018-06-07 DIAGNOSIS — Z85828 Personal history of other malignant neoplasm of skin: Secondary | ICD-10-CM | POA: Diagnosis not present

## 2018-06-07 DIAGNOSIS — D485 Neoplasm of uncertain behavior of skin: Secondary | ICD-10-CM | POA: Diagnosis not present

## 2018-06-07 DIAGNOSIS — D0471 Carcinoma in situ of skin of right lower limb, including hip: Secondary | ICD-10-CM | POA: Diagnosis not present

## 2018-06-16 DIAGNOSIS — E7849 Other hyperlipidemia: Secondary | ICD-10-CM | POA: Diagnosis not present

## 2018-06-16 DIAGNOSIS — I10 Essential (primary) hypertension: Secondary | ICD-10-CM | POA: Diagnosis not present

## 2018-06-16 DIAGNOSIS — R82998 Other abnormal findings in urine: Secondary | ICD-10-CM | POA: Diagnosis not present

## 2018-06-16 DIAGNOSIS — R7301 Impaired fasting glucose: Secondary | ICD-10-CM | POA: Diagnosis not present

## 2018-06-16 DIAGNOSIS — Z125 Encounter for screening for malignant neoplasm of prostate: Secondary | ICD-10-CM | POA: Diagnosis not present

## 2018-06-23 DIAGNOSIS — R062 Wheezing: Secondary | ICD-10-CM | POA: Diagnosis not present

## 2018-06-23 DIAGNOSIS — E298 Other testicular dysfunction: Secondary | ICD-10-CM | POA: Diagnosis not present

## 2018-06-23 DIAGNOSIS — J309 Allergic rhinitis, unspecified: Secondary | ICD-10-CM | POA: Diagnosis not present

## 2018-06-23 DIAGNOSIS — E7849 Other hyperlipidemia: Secondary | ICD-10-CM | POA: Diagnosis not present

## 2018-06-23 DIAGNOSIS — D751 Secondary polycythemia: Secondary | ICD-10-CM | POA: Diagnosis not present

## 2018-06-23 DIAGNOSIS — I10 Essential (primary) hypertension: Secondary | ICD-10-CM | POA: Diagnosis not present

## 2018-06-23 DIAGNOSIS — R7301 Impaired fasting glucose: Secondary | ICD-10-CM | POA: Diagnosis not present

## 2018-06-23 DIAGNOSIS — F3342 Major depressive disorder, recurrent, in full remission: Secondary | ICD-10-CM | POA: Diagnosis not present

## 2018-06-23 DIAGNOSIS — Z0000 Encounter for general adult medical examination without abnormal findings: Secondary | ICD-10-CM | POA: Diagnosis not present

## 2018-08-05 DIAGNOSIS — Z85828 Personal history of other malignant neoplasm of skin: Secondary | ICD-10-CM | POA: Diagnosis not present

## 2018-08-05 DIAGNOSIS — L239 Allergic contact dermatitis, unspecified cause: Secondary | ICD-10-CM | POA: Diagnosis not present

## 2018-08-18 DIAGNOSIS — H35722 Serous detachment of retinal pigment epithelium, left eye: Secondary | ICD-10-CM | POA: Diagnosis not present

## 2018-08-18 DIAGNOSIS — H00025 Hordeolum internum left lower eyelid: Secondary | ICD-10-CM | POA: Diagnosis not present

## 2018-08-23 DIAGNOSIS — I10 Essential (primary) hypertension: Secondary | ICD-10-CM | POA: Diagnosis not present

## 2018-08-23 DIAGNOSIS — R202 Paresthesia of skin: Secondary | ICD-10-CM | POA: Diagnosis not present

## 2018-08-23 DIAGNOSIS — M5412 Radiculopathy, cervical region: Secondary | ICD-10-CM | POA: Diagnosis not present

## 2018-08-26 DIAGNOSIS — R200 Anesthesia of skin: Secondary | ICD-10-CM | POA: Diagnosis not present

## 2018-08-26 DIAGNOSIS — I10 Essential (primary) hypertension: Secondary | ICD-10-CM | POA: Diagnosis not present

## 2018-08-26 DIAGNOSIS — R202 Paresthesia of skin: Secondary | ICD-10-CM | POA: Diagnosis not present

## 2018-08-26 DIAGNOSIS — M542 Cervicalgia: Secondary | ICD-10-CM | POA: Diagnosis not present

## 2018-09-30 DIAGNOSIS — G5603 Carpal tunnel syndrome, bilateral upper limbs: Secondary | ICD-10-CM | POA: Diagnosis not present

## 2018-09-30 DIAGNOSIS — M5412 Radiculopathy, cervical region: Secondary | ICD-10-CM | POA: Diagnosis not present

## 2018-10-04 DIAGNOSIS — M47812 Spondylosis without myelopathy or radiculopathy, cervical region: Secondary | ICD-10-CM | POA: Diagnosis not present

## 2018-10-07 DIAGNOSIS — G5603 Carpal tunnel syndrome, bilateral upper limbs: Secondary | ICD-10-CM | POA: Diagnosis not present

## 2018-10-15 DIAGNOSIS — G5603 Carpal tunnel syndrome, bilateral upper limbs: Secondary | ICD-10-CM | POA: Diagnosis not present

## 2018-10-15 DIAGNOSIS — G5602 Carpal tunnel syndrome, left upper limb: Secondary | ICD-10-CM | POA: Diagnosis not present

## 2018-10-26 ENCOUNTER — Other Ambulatory Visit: Payer: Self-pay

## 2018-10-26 DIAGNOSIS — Z20822 Contact with and (suspected) exposure to covid-19: Secondary | ICD-10-CM

## 2018-10-27 LAB — NOVEL CORONAVIRUS, NAA: SARS-CoV-2, NAA: NOT DETECTED

## 2018-11-24 ENCOUNTER — Other Ambulatory Visit: Payer: Self-pay | Admitting: Urology

## 2018-11-24 DIAGNOSIS — D225 Melanocytic nevi of trunk: Secondary | ICD-10-CM | POA: Diagnosis not present

## 2018-11-24 DIAGNOSIS — E291 Testicular hypofunction: Secondary | ICD-10-CM | POA: Diagnosis not present

## 2018-11-24 DIAGNOSIS — L720 Epidermal cyst: Secondary | ICD-10-CM | POA: Diagnosis not present

## 2018-11-24 DIAGNOSIS — R32 Unspecified urinary incontinence: Secondary | ICD-10-CM | POA: Diagnosis not present

## 2018-11-24 DIAGNOSIS — Z85828 Personal history of other malignant neoplasm of skin: Secondary | ICD-10-CM | POA: Diagnosis not present

## 2018-11-24 DIAGNOSIS — D485 Neoplasm of uncertain behavior of skin: Secondary | ICD-10-CM | POA: Diagnosis not present

## 2018-11-24 DIAGNOSIS — L821 Other seborrheic keratosis: Secondary | ICD-10-CM | POA: Diagnosis not present

## 2018-11-24 DIAGNOSIS — D1801 Hemangioma of skin and subcutaneous tissue: Secondary | ICD-10-CM | POA: Diagnosis not present

## 2018-11-24 DIAGNOSIS — R972 Elevated prostate specific antigen [PSA]: Secondary | ICD-10-CM | POA: Diagnosis not present

## 2018-11-24 DIAGNOSIS — L812 Freckles: Secondary | ICD-10-CM | POA: Diagnosis not present

## 2018-11-24 DIAGNOSIS — N5201 Erectile dysfunction due to arterial insufficiency: Secondary | ICD-10-CM | POA: Diagnosis not present

## 2018-11-24 DIAGNOSIS — L570 Actinic keratosis: Secondary | ICD-10-CM | POA: Diagnosis not present

## 2018-11-25 LAB — TESTOSTERONE: Testosterone: 615 ng/dL (ref 264–916)

## 2018-11-25 LAB — SARS-COV-2 ANTIBODIES: SARS-CoV-2 Antibodies: NEGATIVE

## 2018-11-25 LAB — PSA: Prostate Specific Ag, Serum: 4.1 ng/mL — ABNORMAL HIGH (ref 0.0–4.0)

## 2018-11-25 LAB — HEMOGLOBIN: Hemoglobin: 18.1 g/dL — ABNORMAL HIGH (ref 13.0–17.7)

## 2018-12-02 DIAGNOSIS — Z23 Encounter for immunization: Secondary | ICD-10-CM | POA: Diagnosis not present

## 2018-12-20 DIAGNOSIS — G5601 Carpal tunnel syndrome, right upper limb: Secondary | ICD-10-CM | POA: Diagnosis not present

## 2018-12-21 DIAGNOSIS — G5603 Carpal tunnel syndrome, bilateral upper limbs: Secondary | ICD-10-CM | POA: Diagnosis not present

## 2019-02-11 DIAGNOSIS — Z20828 Contact with and (suspected) exposure to other viral communicable diseases: Secondary | ICD-10-CM | POA: Diagnosis not present

## 2019-02-25 DIAGNOSIS — Z03818 Encounter for observation for suspected exposure to other biological agents ruled out: Secondary | ICD-10-CM | POA: Diagnosis not present

## 2019-03-01 DIAGNOSIS — H43822 Vitreomacular adhesion, left eye: Secondary | ICD-10-CM | POA: Diagnosis not present

## 2019-03-01 DIAGNOSIS — H35362 Drusen (degenerative) of macula, left eye: Secondary | ICD-10-CM | POA: Diagnosis not present

## 2019-03-01 DIAGNOSIS — H35712 Central serous chorioretinopathy, left eye: Secondary | ICD-10-CM | POA: Diagnosis not present

## 2019-03-01 DIAGNOSIS — H43811 Vitreous degeneration, right eye: Secondary | ICD-10-CM | POA: Diagnosis not present

## 2019-03-16 DIAGNOSIS — Z03818 Encounter for observation for suspected exposure to other biological agents ruled out: Secondary | ICD-10-CM | POA: Diagnosis not present

## 2019-03-29 DIAGNOSIS — H43811 Vitreous degeneration, right eye: Secondary | ICD-10-CM | POA: Diagnosis not present

## 2019-03-29 DIAGNOSIS — H35362 Drusen (degenerative) of macula, left eye: Secondary | ICD-10-CM | POA: Diagnosis not present

## 2019-03-29 DIAGNOSIS — H35712 Central serous chorioretinopathy, left eye: Secondary | ICD-10-CM | POA: Diagnosis not present

## 2019-03-29 DIAGNOSIS — H43823 Vitreomacular adhesion, bilateral: Secondary | ICD-10-CM | POA: Diagnosis not present

## 2019-04-11 ENCOUNTER — Other Ambulatory Visit: Payer: PPO

## 2019-04-11 ENCOUNTER — Ambulatory Visit: Payer: PPO | Attending: Internal Medicine

## 2019-04-11 DIAGNOSIS — Z20822 Contact with and (suspected) exposure to covid-19: Secondary | ICD-10-CM | POA: Diagnosis not present

## 2019-04-12 LAB — NOVEL CORONAVIRUS, NAA: SARS-CoV-2, NAA: NOT DETECTED

## 2019-04-27 DIAGNOSIS — Z03818 Encounter for observation for suspected exposure to other biological agents ruled out: Secondary | ICD-10-CM | POA: Diagnosis not present

## 2019-05-24 DIAGNOSIS — L812 Freckles: Secondary | ICD-10-CM | POA: Diagnosis not present

## 2019-05-24 DIAGNOSIS — D1801 Hemangioma of skin and subcutaneous tissue: Secondary | ICD-10-CM | POA: Diagnosis not present

## 2019-05-24 DIAGNOSIS — Z85828 Personal history of other malignant neoplasm of skin: Secondary | ICD-10-CM | POA: Diagnosis not present

## 2019-05-24 DIAGNOSIS — L821 Other seborrheic keratosis: Secondary | ICD-10-CM | POA: Diagnosis not present

## 2019-05-24 DIAGNOSIS — L578 Other skin changes due to chronic exposure to nonionizing radiation: Secondary | ICD-10-CM | POA: Diagnosis not present

## 2019-05-24 DIAGNOSIS — D485 Neoplasm of uncertain behavior of skin: Secondary | ICD-10-CM | POA: Diagnosis not present

## 2019-06-22 DIAGNOSIS — R82998 Other abnormal findings in urine: Secondary | ICD-10-CM | POA: Diagnosis not present

## 2019-06-22 DIAGNOSIS — R7301 Impaired fasting glucose: Secondary | ICD-10-CM | POA: Diagnosis not present

## 2019-06-22 DIAGNOSIS — Z1212 Encounter for screening for malignant neoplasm of rectum: Secondary | ICD-10-CM | POA: Diagnosis not present

## 2019-06-22 DIAGNOSIS — Z125 Encounter for screening for malignant neoplasm of prostate: Secondary | ICD-10-CM | POA: Diagnosis not present

## 2019-06-22 DIAGNOSIS — E7849 Other hyperlipidemia: Secondary | ICD-10-CM | POA: Diagnosis not present

## 2019-06-22 DIAGNOSIS — I10 Essential (primary) hypertension: Secondary | ICD-10-CM | POA: Diagnosis not present

## 2019-06-30 DIAGNOSIS — D751 Secondary polycythemia: Secondary | ICD-10-CM | POA: Diagnosis not present

## 2019-06-30 DIAGNOSIS — I10 Essential (primary) hypertension: Secondary | ICD-10-CM | POA: Diagnosis not present

## 2019-06-30 DIAGNOSIS — Z0000 Encounter for general adult medical examination without abnormal findings: Secondary | ICD-10-CM | POA: Diagnosis not present

## 2019-06-30 DIAGNOSIS — R7301 Impaired fasting glucose: Secondary | ICD-10-CM | POA: Diagnosis not present

## 2019-06-30 DIAGNOSIS — K219 Gastro-esophageal reflux disease without esophagitis: Secondary | ICD-10-CM | POA: Diagnosis not present

## 2019-06-30 DIAGNOSIS — E785 Hyperlipidemia, unspecified: Secondary | ICD-10-CM | POA: Diagnosis not present

## 2019-06-30 DIAGNOSIS — G250 Essential tremor: Secondary | ICD-10-CM | POA: Diagnosis not present

## 2019-06-30 DIAGNOSIS — E298 Other testicular dysfunction: Secondary | ICD-10-CM | POA: Diagnosis not present

## 2019-06-30 DIAGNOSIS — R7401 Elevation of levels of liver transaminase levels: Secondary | ICD-10-CM | POA: Diagnosis not present

## 2019-06-30 DIAGNOSIS — F3342 Major depressive disorder, recurrent, in full remission: Secondary | ICD-10-CM | POA: Diagnosis not present

## 2019-06-30 DIAGNOSIS — H3581 Retinal edema: Secondary | ICD-10-CM | POA: Diagnosis not present

## 2019-07-14 DIAGNOSIS — Z23 Encounter for immunization: Secondary | ICD-10-CM | POA: Diagnosis not present

## 2019-08-02 DIAGNOSIS — H2513 Age-related nuclear cataract, bilateral: Secondary | ICD-10-CM | POA: Diagnosis not present

## 2019-08-02 DIAGNOSIS — H353221 Exudative age-related macular degeneration, left eye, with active choroidal neovascularization: Secondary | ICD-10-CM | POA: Diagnosis not present

## 2019-08-02 DIAGNOSIS — H35413 Lattice degeneration of retina, bilateral: Secondary | ICD-10-CM | POA: Diagnosis not present

## 2019-08-23 DIAGNOSIS — D485 Neoplasm of uncertain behavior of skin: Secondary | ICD-10-CM | POA: Diagnosis not present

## 2019-08-23 DIAGNOSIS — Z85828 Personal history of other malignant neoplasm of skin: Secondary | ICD-10-CM | POA: Diagnosis not present

## 2019-08-23 DIAGNOSIS — D1801 Hemangioma of skin and subcutaneous tissue: Secondary | ICD-10-CM | POA: Diagnosis not present

## 2019-08-23 DIAGNOSIS — L570 Actinic keratosis: Secondary | ICD-10-CM | POA: Diagnosis not present

## 2019-09-06 DIAGNOSIS — H2513 Age-related nuclear cataract, bilateral: Secondary | ICD-10-CM | POA: Diagnosis not present

## 2019-09-06 DIAGNOSIS — H35413 Lattice degeneration of retina, bilateral: Secondary | ICD-10-CM | POA: Diagnosis not present

## 2019-09-06 DIAGNOSIS — H353221 Exudative age-related macular degeneration, left eye, with active choroidal neovascularization: Secondary | ICD-10-CM | POA: Diagnosis not present

## 2019-10-11 DIAGNOSIS — H353221 Exudative age-related macular degeneration, left eye, with active choroidal neovascularization: Secondary | ICD-10-CM | POA: Diagnosis not present

## 2019-10-11 DIAGNOSIS — H35413 Lattice degeneration of retina, bilateral: Secondary | ICD-10-CM | POA: Diagnosis not present

## 2019-10-11 DIAGNOSIS — H2513 Age-related nuclear cataract, bilateral: Secondary | ICD-10-CM | POA: Diagnosis not present

## 2019-10-19 DIAGNOSIS — Z23 Encounter for immunization: Secondary | ICD-10-CM | POA: Diagnosis not present

## 2019-10-19 DIAGNOSIS — W268XXA Contact with other sharp object(s), not elsewhere classified, initial encounter: Secondary | ICD-10-CM | POA: Diagnosis not present

## 2019-10-19 DIAGNOSIS — S61211A Laceration without foreign body of left index finger without damage to nail, initial encounter: Secondary | ICD-10-CM | POA: Diagnosis not present

## 2019-10-19 DIAGNOSIS — S61219A Laceration without foreign body of unspecified finger without damage to nail, initial encounter: Secondary | ICD-10-CM | POA: Diagnosis not present

## 2019-11-08 DIAGNOSIS — H353221 Exudative age-related macular degeneration, left eye, with active choroidal neovascularization: Secondary | ICD-10-CM | POA: Diagnosis not present

## 2019-11-08 DIAGNOSIS — H35413 Lattice degeneration of retina, bilateral: Secondary | ICD-10-CM | POA: Diagnosis not present

## 2019-11-08 DIAGNOSIS — H2513 Age-related nuclear cataract, bilateral: Secondary | ICD-10-CM | POA: Diagnosis not present

## 2019-11-30 DIAGNOSIS — R972 Elevated prostate specific antigen [PSA]: Secondary | ICD-10-CM | POA: Diagnosis not present

## 2019-11-30 DIAGNOSIS — R32 Unspecified urinary incontinence: Secondary | ICD-10-CM | POA: Diagnosis not present

## 2019-11-30 DIAGNOSIS — E291 Testicular hypofunction: Secondary | ICD-10-CM | POA: Diagnosis not present

## 2019-11-30 DIAGNOSIS — N5201 Erectile dysfunction due to arterial insufficiency: Secondary | ICD-10-CM | POA: Diagnosis not present

## 2019-12-06 DIAGNOSIS — H353221 Exudative age-related macular degeneration, left eye, with active choroidal neovascularization: Secondary | ICD-10-CM | POA: Diagnosis not present

## 2019-12-06 DIAGNOSIS — H35413 Lattice degeneration of retina, bilateral: Secondary | ICD-10-CM | POA: Diagnosis not present

## 2019-12-06 DIAGNOSIS — H2513 Age-related nuclear cataract, bilateral: Secondary | ICD-10-CM | POA: Diagnosis not present

## 2020-01-03 DIAGNOSIS — H35413 Lattice degeneration of retina, bilateral: Secondary | ICD-10-CM | POA: Diagnosis not present

## 2020-01-03 DIAGNOSIS — H353221 Exudative age-related macular degeneration, left eye, with active choroidal neovascularization: Secondary | ICD-10-CM | POA: Diagnosis not present

## 2020-01-03 DIAGNOSIS — H2513 Age-related nuclear cataract, bilateral: Secondary | ICD-10-CM | POA: Diagnosis not present

## 2020-02-13 DIAGNOSIS — M47812 Spondylosis without myelopathy or radiculopathy, cervical region: Secondary | ICD-10-CM | POA: Diagnosis not present

## 2020-02-14 DIAGNOSIS — H35413 Lattice degeneration of retina, bilateral: Secondary | ICD-10-CM | POA: Diagnosis not present

## 2020-02-14 DIAGNOSIS — H2513 Age-related nuclear cataract, bilateral: Secondary | ICD-10-CM | POA: Diagnosis not present

## 2020-02-14 DIAGNOSIS — H353221 Exudative age-related macular degeneration, left eye, with active choroidal neovascularization: Secondary | ICD-10-CM | POA: Diagnosis not present

## 2020-05-15 DIAGNOSIS — M542 Cervicalgia: Secondary | ICD-10-CM | POA: Diagnosis not present

## 2020-06-05 DIAGNOSIS — M542 Cervicalgia: Secondary | ICD-10-CM | POA: Diagnosis not present

## 2020-10-02 DIAGNOSIS — Z1159 Encounter for screening for other viral diseases: Secondary | ICD-10-CM | POA: Diagnosis not present

## 2020-10-08 ENCOUNTER — Institutional Professional Consult (permissible substitution): Payer: Medicare Other | Admitting: Neurology

## 2020-10-10 ENCOUNTER — Institutional Professional Consult (permissible substitution): Payer: Medicare Other | Admitting: Neurology

## 2020-11-20 ENCOUNTER — Institutional Professional Consult (permissible substitution): Payer: Medicare Other | Admitting: Neurology

## 2020-11-20 ENCOUNTER — Telehealth: Payer: Self-pay | Admitting: Neurology

## 2020-11-20 NOTE — Telephone Encounter (Signed)
Pt cancelled appt due to flight cancelled, will reschedule when return.

## 2020-11-20 NOTE — Telephone Encounter (Signed)
noted 

## 2020-12-26 ENCOUNTER — Encounter: Payer: Self-pay | Admitting: Gastroenterology

## 2021-01-16 ENCOUNTER — Telehealth: Payer: Self-pay | Admitting: *Deleted

## 2021-01-16 NOTE — Telephone Encounter (Signed)
Dr.Beavers,  In epic this patient had a colonoscopy with Dr.Johnson 2016, tubular adenoma polyp, recall 5 years. Patient is now scheduled with you for a direct colonoscopy. Ok to proceed? Please advise. Thank you, Eleina Jergens pv

## 2021-01-16 NOTE — Telephone Encounter (Signed)
Noted  

## 2021-01-29 ENCOUNTER — Other Ambulatory Visit: Payer: Self-pay

## 2021-01-29 ENCOUNTER — Ambulatory Visit (AMBULATORY_SURGERY_CENTER): Payer: PPO

## 2021-01-29 VITALS — Ht 67.0 in | Wt 170.0 lb

## 2021-01-29 DIAGNOSIS — Z86010 Personal history of colonic polyps: Secondary | ICD-10-CM

## 2021-01-29 MED ORDER — NA SULFATE-K SULFATE-MG SULF 17.5-3.13-1.6 GM/177ML PO SOLN: 1.0000 | Freq: Once | ORAL | 0 refills | Status: AC

## 2021-01-29 NOTE — Progress Notes (Signed)
Denies allergies to eggs or soy products. Denies complication of anesthesia or sedation. Denies use of weight loss medication. Denies use of O2.   Emmi instructions given for colonoscopy.  

## 2021-02-13 ENCOUNTER — Encounter: Payer: Self-pay | Admitting: Gastroenterology

## 2021-02-13 ENCOUNTER — Ambulatory Visit (AMBULATORY_SURGERY_CENTER): Payer: Medicare Other | Admitting: Gastroenterology

## 2021-02-13 ENCOUNTER — Other Ambulatory Visit: Payer: Self-pay

## 2021-02-13 VITALS — BP 130/88 | HR 58 | Temp 97.8°F | Resp 16 | Ht 67.5 in | Wt 170.0 lb

## 2021-02-13 DIAGNOSIS — D122 Benign neoplasm of ascending colon: Secondary | ICD-10-CM | POA: Diagnosis not present

## 2021-02-13 DIAGNOSIS — Z86010 Personal history of colonic polyps: Secondary | ICD-10-CM | POA: Diagnosis not present

## 2021-02-13 MED ORDER — SODIUM CHLORIDE 0.9 % IV SOLN: 500.0000 mL | Freq: Once | INTRAVENOUS | Status: DC

## 2021-02-13 NOTE — Progress Notes (Signed)
Referring Provider: Thornton Park, MD Primary Care Physician:  Pcp, No  Reason for Procedure:  Colon cancer surveillance   IMPRESSION:  Need for colon cancer surveillance Personal history of colon polyps Appropriate candidate for monitored anesthesia care  PLAN: Colonoscopy in the [place] today   HPI: Jacob [name] is a 70 y.o. male presents for surveillance colonoscopy.  Colonoscopy with Dr. Wynetta Emery in 2016 revealed 2 small polyps. Pathology results showed one tubular adenoma and one hyperplastic polyp. Surveillance colonoscopy recommended in 5 years.   No baseline GI symptoms.   No known family history of colon cancer or polyps. No family history of uterine/endometrial cancer, pancreatic cancer or gastric/stomach cancer.   Past Medical History:  Diagnosis Date   Depression    DJD (degenerative joint disease)    ED (erectile dysfunction) of organic origin    GERD (gastroesophageal reflux disease)    Hemorrhoids    History of adenomatous polyp of colon    12-11-2014  tubular adenoam, hyperplastic   Hyperlipidemia    Hypertension     Past Surgical History:  Procedure Laterality Date   ACHILLES TENDON REPAIR Right 11/2009   ruptured   ANTERIOR CERVICAL DECOMP/DISCECTOMY FUSION  04/23/2009   C7 -- T1   COLONOSCOPY WITH PROPOFOL N/A 12/11/2014   Procedure: COLONOSCOPY WITH PROPOFOL;  Surgeon: Garlan Fair, MD;  Location: [facility];  Service: Endoscopy;  Laterality: N/A;   FINGER ARTHRODESIS Right 02/06/2017   Procedure: ARTHRODESIS RIGHT INDEX FINGER DISTAL INTERPHALANGEAL JOINT;  Surgeon: Leanora Cover, MD;  Location: [facility];  Service: Orthopedics;  Laterality: Right;   INGUINAL HERNIA REPAIR Bilateral left 03/18/2002/  right -- yrs ago   [place] N/A 04/03/2016   Procedure: PENILE PROTHESIS INFLATABLE;  Surgeon: Franchot Gallo, MD;  Location: [facility];  Service: Urology;  Laterality: N/A;   SHOULDER  ARTHROSCOPY W/ ROTATOR CUFF REPAIR Right 2002 and 2012   SHOULDER ARTHROSCOPY WITH ROTATOR CUFF REPAIR AND SUBACROMIAL DECOMPRESSION Right 05/06/2012   Procedure: SHOULDER ARTHROSCOPY WITH ROTATOR CUFF REPAIR AND SUBACROMIAL DECOMPRESSION;  Surgeon: Ninetta Lights, MD;  Location: [place];  Service: Orthopedics;  Laterality: Right;  RIGHT SHOULDER ARTHROSCOPY  lysis of adhesions rotator cuff repair    SHOULDER ARTHROSCOPY/  DEBRIDEMENT LABRUM AND ROTATOR CUFF PARTIAL TEAR/  RESECTION BURSA/  ACROMIOPLASTY/  CORACOACROMINAL LIGAMENT RELEASE/  DCR Left 12/17/2001   SOFT TISSUE MASS EXCISION  06/25/2006   right forearm-- angiolipoma   TONSILLECTOMY AND ADENOIDECTOMY  child   TOTAL SHOULDER ARTHROPLASTY Right 02/04/2016    Current Outpatient Medications  Medication Sig Dispense Refill   Aluminum & Magnesium Hydroxide (MAGNESIUM-ALUMINUM PO) Take by mouth. Magnesium 3 tablets daily.     aspirin EC 81 MG tablet Take 81 mg by mouth daily. Swallow whole.     atorvastatin (LIPITOR) 20 MG tablet Take 20 mg by mouth every morning.      buPROPion (WELLBUTRIN XL) 300 MG 24 hr tablet Take 300 mg by mouth every morning.      irbesartan (AVAPRO) 300 MG tablet Take 300 mg by mouth every morning.      omeprazole (PRILOSEC) 40 MG capsule Take 40 mg by mouth every morning.      TESTOSTERONE IM Inject 1.5 mLs into the muscle every 14 (fourteen) days.     TRINTELLIX 10 MG TABS tablet Take 10 mg by mouth daily.     oxybutynin (DITROPAN-XL) 10 MG 24 hr tablet Take 10 mg by mouth  every morning.  (Patient not taking: Reported on 01/29/2021)     tamsulosin (FLOMAX) 0.4 MG CAPS capsule Take 0.4 mg by mouth daily. (Patient not taking: Reported on 02/13/2021)     Current Facility-Administered Medications  Medication Dose Route Frequency Provider Last Rate Last Admin   0.9 %  sodium chloride infusion  500 mL Intravenous Once Thornton Park, MD        Allergies as of 02/13/2021 - Review Complete  02/13/2021  Allergen Reaction Noted   Vancomycin  02/05/2017   Hydrocodone Other (See Comments) 02/06/2017   Penicillins Other (See Comments) 12/23/2011   Percocet [oxycodone-acetaminophen] Other (See Comments) 02/06/2017    Family History  Problem Relation Age of Onset   Parkinsonism Father    Colon cancer Neg Hx    Esophageal cancer Neg Hx    Rectal cancer Neg Hx    Stomach cancer Neg Hx      Physical Exam: General:   Alert,  well-nourished, pleasant and cooperative in NAD Head:  Normocephalic and atraumatic. Eyes:  Sclera clear, no icterus.   Conjunctiva pink. Mouth:  No deformity or lesions.   Neck:  Supple; no masses or thyromegaly. Lungs:  Clear throughout to auscultation.   No wheezes. Heart:  Regular rate and rhythm; no murmurs. Abdomen:  Soft, non-tender, nondistended, normal bowel sounds, no rebound or guarding.  Msk:  Symmetrical. No boney deformities LAD: No inguinal or umbilical LAD Extremities:  No clubbing or edema. Neurologic:  Alert and  oriented x4;  grossly nonfocal Skin:  No obvious rash or bruise. Psych:  Alert and cooperative. Normal mood and affect.     Studies/Results: No results found.    Jacob Brendle L. Tarri Glenn, MD, MPH 02/13/2021, 8:41 AM

## 2021-02-13 NOTE — Progress Notes (Signed)
To pacu, vss. Report to [email or web address] 

## 2021-02-13 NOTE — Patient Instructions (Signed)
Handouts on polyps and diverticulosis given to you today   YOU HAD AN ENDOSCOPIC PROCEDURE TODAY AT THE [facility]:   Refer to the procedure report that was given to you for any specific questions about what was found during the examination.  If the procedure report does not answer your questions, please call your gastroenterologist to clarify.  If you requested that your care partner not be given the details of your procedure findings, then the procedure report has been included in a sealed envelope for you to review at your convenience later.  YOU SHOULD EXPECT: Some feelings of bloating in the abdomen. Passage of more gas than usual.  Walking can help get rid of the air that was put into your GI tract during the procedure and reduce the bloating. If you had a lower endoscopy (such as a colonoscopy or flexible sigmoidoscopy) you may notice spotting of blood in your stool or on the toilet paper. If you underwent a bowel prep for your procedure, you may not have a normal bowel movement for a few days.  Please Note:  You might notice some irritation and congestion in your nose or some drainage.  This is from the oxygen used during your procedure.  There is no need for concern and it should clear up in a day or so.  SYMPTOMS TO REPORT IMMEDIATELY:  Following lower endoscopy (colonoscopy or flexible sigmoidoscopy):  Excessive amounts of blood in the stool  Significant tenderness or worsening of abdominal pains  Swelling of the abdomen that is new, acute  Fever of 100F or higher  For urgent or emergent issues, a gastroenterologist can be reached at any hour by calling [phone]. Do not use MyChart messaging for urgent concerns.    DIET:  We do recommend a small meal at first, but then you may proceed to your regular diet.  Drink plenty of fluids but you should avoid alcoholic beverages for 24 hours.  ACTIVITY:  You should plan to take it easy for the rest of today and you should  NOT DRIVE or use heavy machinery until tomorrow (because of the sedation medicines used during the test).    FOLLOW UP: Our staff will call the number listed on your records 48-72 hours following your procedure to check on you and address any questions or concerns that you may have regarding the information given to you following your procedure. If we do not reach you, we will leave a message.  We will attempt to reach you two times.  During this call, we will ask if you have developed any symptoms of COVID 19. If you develop any symptoms (ie: fever, flu-like symptoms, shortness of breath, cough etc.) before then, please call [phone].  If you test positive for Covid 19 in the 2 weeks post procedure, please call and report this information to us.    If any biopsies were taken you will be contacted by phone or by letter within the next 1-3 weeks.  Please call us at [phone] if you have not heard about the biopsies in 3 weeks.    SIGNATURES/CONFIDENTIALITY: You and/or your care partner have signed paperwork which will be entered into your electronic medical record.  These signatures attest to the fact that that the information above on your After Visit Summary has been reviewed and is understood.  Full responsibility of the confidentiality of this discharge information lies with you and/or your care-partner.  

## 2021-02-13 NOTE — Op Note (Addendum)
[facility] Patient Name: Jacob [name] Procedure Date: 02/13/2021 8:43 AM MRN: [id number] Endoscopist: Thornton Park MD, MD Age: 70 Referring MD:  Date of Birth: November 28, 1950 Gender: Male Account #: [id number] Procedure:                Colonoscopy Indications:              Surveillance: Personal history of adenomatous                            polyps on last colonoscopy > 5 years ago                           Colonoscopy with Dr. Wynetta Emery in 2016 revealed a                            small tubular adenoma and a hyperplastic polyp.                            Surveillance colonoscopy recommended in 5 years. Medicines:                Monitored Anesthesia Care Procedure:                Pre-Anesthesia Assessment:                           - Prior to the procedure, a History and Physical                            was performed, and patient medications and                            allergies were reviewed. The patient's tolerance of                            previous anesthesia was also reviewed. The risks                            and benefits of the procedure and the sedation                            options and risks were discussed with the patient.                            All questions were answered, and informed consent                            was obtained. Prior Anticoagulants: The patient has                            taken no previous anticoagulant or antiplatelet                            agents. ASA Grade Assessment: II - A patient with  mild systemic disease. After reviewing the risks                            and benefits, the patient was deemed in                            satisfactory condition to undergo the procedure.                           After obtaining informed consent, the colonoscope                            was passed under direct vision. Throughout the                            procedure, the patient's blood  pressure, pulse, and                            oxygen saturations were monitored continuously. The                            CF [id number] #[id number] was introduced through the anus                            and advanced to the 3 cm into the ileum. A second                            forward view of the right colon was performed. The                            colonoscopy was performed without difficulty. The                            patient tolerated the procedure well. The quality                            of the bowel preparation was good. The terminal                            ileum, ileocecal valve, appendiceal orifice, and                            rectum were photographed. Scope In: 8:51:31 AM Scope Out: 9:05:31 AM Scope Withdrawal Time: 0 hours 11 minutes 5 seconds  Total Procedure Duration: 0 hours 14 minutes 0 seconds  Findings:                 The perianal and digital rectal examinations were                            normal.                           Two sessile polyps were found in the ascending  colon. The polyps were 1 to 3 mm in size. These                            polyps were removed with a cold snare. Resection                            and retrieval were complete. Estimated blood loss                            was minimal.                           Many small-mouthed diverticula were found in the                            sigmoid colon.                           Non-bleeding external and internal hemorrhoids were                            found.                           The exam was otherwise without abnormality on                            direct and retroflexion views. Complications:            No immediate complications. Estimated blood loss:                            Minimal. Estimated Blood Loss:     Estimated blood loss was minimal. Impression:               - Two 1 to 3 mm polyps in the ascending colon,                             removed with a cold snare. Resected and retrieved.                           - Diverticulosis in the sigmoid colon.                           - Non-bleeding external and internal hemorrhoids.                           - The examination was otherwise normal on direct                            and retroflexion views. Recommendation:           - Patient has a contact number available for                            emergencies. The signs and symptoms of potential  delayed complications were discussed with the                            patient. Return to normal activities tomorrow.                            Written discharge instructions were provided to the                            patient.                           - Resume previous diet. High fiber diet encouraged.                            Consider a daily dose of psyllium.                           - Continue present medications.                           - Await pathology results.                           - Repeat colonoscopy date to be determined after                            pending pathology results are reviewed for                            surveillance.                           - Emerging evidence supports eating a diet of                            fruits, vegetables, grains, calcium, and yogurt                            while reducing red meat and alcohol may reduce the                            risk of colon cancer.                           - Thank you for allowing me to be involved in your                            colon cancer prevention. Thornton Park MD, MD 02/13/2021 9:10:55 AM This report has been signed electronically.

## 2021-02-13 NOTE — Progress Notes (Signed)
Pt's states no medical or surgical changes since previsit or office visit. 

## 2021-02-13 NOTE — Progress Notes (Signed)
Called to room to assist during endoscopic procedure.  Patient ID and intended procedure confirmed with present staff. Received instructions for my participation in the procedure from the performing physician.  

## 2021-02-15 ENCOUNTER — Telehealth: Payer: Self-pay

## 2021-02-15 NOTE — Telephone Encounter (Signed)
Left message on follow up call. 

## 2021-02-19 ENCOUNTER — Encounter: Payer: Self-pay | Admitting: Gastroenterology

## 2021-04-02 DIAGNOSIS — H35413 Lattice degeneration of retina, bilateral: Secondary | ICD-10-CM | POA: Diagnosis not present

## 2021-04-02 DIAGNOSIS — H353221 Exudative age-related macular degeneration, left eye, with active choroidal neovascularization: Secondary | ICD-10-CM | POA: Diagnosis not present

## 2021-04-02 DIAGNOSIS — H2513 Age-related nuclear cataract, bilateral: Secondary | ICD-10-CM | POA: Diagnosis not present

## 2021-04-03 DIAGNOSIS — M542 Cervicalgia: Secondary | ICD-10-CM | POA: Diagnosis not present

## 2021-04-03 DIAGNOSIS — M79642 Pain in left hand: Secondary | ICD-10-CM | POA: Diagnosis not present

## 2021-04-03 DIAGNOSIS — M62542 Muscle wasting and atrophy, not elsewhere classified, left hand: Secondary | ICD-10-CM | POA: Diagnosis not present

## 2021-04-03 DIAGNOSIS — G5603 Carpal tunnel syndrome, bilateral upper limbs: Secondary | ICD-10-CM | POA: Diagnosis not present

## 2021-04-03 DIAGNOSIS — M79641 Pain in right hand: Secondary | ICD-10-CM | POA: Diagnosis not present

## 2021-04-03 DIAGNOSIS — M13841 Other specified arthritis, right hand: Secondary | ICD-10-CM | POA: Diagnosis not present

## 2021-04-04 DIAGNOSIS — Z6829 Body mass index (BMI) 29.0-29.9, adult: Secondary | ICD-10-CM | POA: Diagnosis not present

## 2021-04-04 DIAGNOSIS — I10 Essential (primary) hypertension: Secondary | ICD-10-CM | POA: Diagnosis not present

## 2021-04-04 DIAGNOSIS — H838X3 Other specified diseases of inner ear, bilateral: Secondary | ICD-10-CM | POA: Diagnosis not present

## 2021-04-04 DIAGNOSIS — H903 Sensorineural hearing loss, bilateral: Secondary | ICD-10-CM | POA: Diagnosis not present

## 2021-04-04 DIAGNOSIS — R29898 Other symptoms and signs involving the musculoskeletal system: Secondary | ICD-10-CM | POA: Diagnosis not present

## 2021-04-09 DIAGNOSIS — R202 Paresthesia of skin: Secondary | ICD-10-CM | POA: Diagnosis not present

## 2021-04-09 DIAGNOSIS — M4312 Spondylolisthesis, cervical region: Secondary | ICD-10-CM | POA: Diagnosis not present

## 2021-04-09 DIAGNOSIS — R29898 Other symptoms and signs involving the musculoskeletal system: Secondary | ICD-10-CM | POA: Diagnosis not present

## 2021-04-09 DIAGNOSIS — R200 Anesthesia of skin: Secondary | ICD-10-CM | POA: Diagnosis not present

## 2021-04-10 DIAGNOSIS — M7062 Trochanteric bursitis, left hip: Secondary | ICD-10-CM | POA: Diagnosis not present

## 2021-04-10 DIAGNOSIS — M25552 Pain in left hip: Secondary | ICD-10-CM | POA: Diagnosis not present

## 2021-04-11 DIAGNOSIS — G5603 Carpal tunnel syndrome, bilateral upper limbs: Secondary | ICD-10-CM | POA: Diagnosis not present

## 2021-04-11 DIAGNOSIS — G629 Polyneuropathy, unspecified: Secondary | ICD-10-CM | POA: Diagnosis not present

## 2021-04-29 DIAGNOSIS — H2513 Age-related nuclear cataract, bilateral: Secondary | ICD-10-CM | POA: Diagnosis not present

## 2021-04-29 DIAGNOSIS — H40023 Open angle with borderline findings, high risk, bilateral: Secondary | ICD-10-CM | POA: Diagnosis not present

## 2021-04-29 DIAGNOSIS — H40053 Ocular hypertension, bilateral: Secondary | ICD-10-CM | POA: Diagnosis not present

## 2021-04-29 DIAGNOSIS — H40013 Open angle with borderline findings, low risk, bilateral: Secondary | ICD-10-CM | POA: Diagnosis not present

## 2021-04-29 DIAGNOSIS — H5211 Myopia, right eye: Secondary | ICD-10-CM | POA: Diagnosis not present

## 2021-04-29 DIAGNOSIS — H43392 Other vitreous opacities, left eye: Secondary | ICD-10-CM | POA: Diagnosis not present

## 2021-04-29 DIAGNOSIS — H25813 Combined forms of age-related cataract, bilateral: Secondary | ICD-10-CM | POA: Diagnosis not present

## 2021-04-29 DIAGNOSIS — H52221 Regular astigmatism, right eye: Secondary | ICD-10-CM | POA: Diagnosis not present

## 2021-04-30 ENCOUNTER — Encounter: Payer: Self-pay | Admitting: Rehabilitation

## 2021-04-30 ENCOUNTER — Other Ambulatory Visit: Payer: Self-pay

## 2021-04-30 ENCOUNTER — Ambulatory Visit: Payer: Medicare Other | Admitting: Rehabilitation

## 2021-04-30 DIAGNOSIS — M62542 Muscle wasting and atrophy, not elsewhere classified, left hand: Secondary | ICD-10-CM | POA: Diagnosis not present

## 2021-04-30 DIAGNOSIS — R29898 Other symptoms and signs involving the musculoskeletal system: Secondary | ICD-10-CM

## 2021-04-30 DIAGNOSIS — Z6829 Body mass index (BMI) 29.0-29.9, adult: Secondary | ICD-10-CM | POA: Diagnosis not present

## 2021-04-30 DIAGNOSIS — I10 Essential (primary) hypertension: Secondary | ICD-10-CM | POA: Diagnosis not present

## 2021-04-30 NOTE — Therapy (Signed)
[facility] [address] [place], [place], [address] Phone: [phone]   Fax:  [phone]  Patient Details  Name: SAMEUL TAGLE MRN: [id number] Date of Birth: 25-Oct-1950 Referring Provider:  Eleonore Chiquito*  Encounter Date: 04/30/2021  Pt arrived for PT evaluation, and upon questioning pt about his complaint, he reports biggest issue is L hand weakness.  He is unsure where it is coming from but has appt with Dr. Ronnald Ramp today to get NCV results.  Would prefer he wait and get results and then either see OT if coming from arm vs PT at [facility]. For more orthopedic care.  Pt verbalized understanding and will relay to Dr. Ronnald Ramp.   Cameron Sprang, PT, MPT [facility] [address] [place], [place], [address] Phone: [phone]   Fax:  [phone] 04/30/21, 9:14 AM   [facility] [address] [place], [place], [address] Phone: [phone]   Fax:  [phone]

## 2021-05-03 ENCOUNTER — Other Ambulatory Visit: Payer: Self-pay | Admitting: Neurological Surgery

## 2021-05-03 DIAGNOSIS — M62542 Muscle wasting and atrophy, not elsewhere classified, left hand: Secondary | ICD-10-CM

## 2021-05-06 DIAGNOSIS — D225 Melanocytic nevi of trunk: Secondary | ICD-10-CM | POA: Diagnosis not present

## 2021-05-06 DIAGNOSIS — L853 Xerosis cutis: Secondary | ICD-10-CM | POA: Diagnosis not present

## 2021-05-06 DIAGNOSIS — Z85828 Personal history of other malignant neoplasm of skin: Secondary | ICD-10-CM | POA: Diagnosis not present

## 2021-05-06 DIAGNOSIS — G5622 Lesion of ulnar nerve, left upper limb: Secondary | ICD-10-CM | POA: Diagnosis not present

## 2021-05-06 DIAGNOSIS — L812 Freckles: Secondary | ICD-10-CM | POA: Diagnosis not present

## 2021-05-06 DIAGNOSIS — M5412 Radiculopathy, cervical region: Secondary | ICD-10-CM | POA: Diagnosis not present

## 2021-05-06 DIAGNOSIS — L570 Actinic keratosis: Secondary | ICD-10-CM | POA: Diagnosis not present

## 2021-05-06 DIAGNOSIS — L821 Other seborrheic keratosis: Secondary | ICD-10-CM | POA: Diagnosis not present

## 2021-05-06 DIAGNOSIS — D045 Carcinoma in situ of skin of trunk: Secondary | ICD-10-CM | POA: Diagnosis not present

## 2021-05-06 DIAGNOSIS — D485 Neoplasm of uncertain behavior of skin: Secondary | ICD-10-CM | POA: Diagnosis not present

## 2021-05-06 DIAGNOSIS — D2261 Melanocytic nevi of right upper limb, including shoulder: Secondary | ICD-10-CM | POA: Diagnosis not present

## 2021-05-07 DIAGNOSIS — H35413 Lattice degeneration of retina, bilateral: Secondary | ICD-10-CM | POA: Diagnosis not present

## 2021-05-07 DIAGNOSIS — H353221 Exudative age-related macular degeneration, left eye, with active choroidal neovascularization: Secondary | ICD-10-CM | POA: Diagnosis not present

## 2021-05-07 DIAGNOSIS — H2513 Age-related nuclear cataract, bilateral: Secondary | ICD-10-CM | POA: Diagnosis not present

## 2021-05-08 ENCOUNTER — Ambulatory Visit
Admission: RE | Admit: 2021-05-08 | Discharge: 2021-05-08 | Disposition: A | Discharge status: Home / Self Care | Payer: Medicare Other | Source: Ambulatory Visit | Attending: Neurological Surgery | Admitting: Neurological Surgery

## 2021-05-08 DIAGNOSIS — M4802 Spinal stenosis, cervical region: Secondary | ICD-10-CM | POA: Diagnosis not present

## 2021-05-08 DIAGNOSIS — M4322 Fusion of spine, cervical region: Secondary | ICD-10-CM | POA: Diagnosis not present

## 2021-05-08 DIAGNOSIS — M62542 Muscle wasting and atrophy, not elsewhere classified, left hand: Secondary | ICD-10-CM

## 2021-05-08 DIAGNOSIS — M50222 Other cervical disc displacement at C5-C6 level: Secondary | ICD-10-CM | POA: Diagnosis not present

## 2021-05-08 MED ORDER — ONDANSETRON HCL 4 MG/2ML IJ SOLN: 4.0000 mg | Freq: Once | INTRAMUSCULAR | Status: DC | PRN

## 2021-05-08 MED ORDER — IOPAMIDOL (ISOVUE-M 300) INJECTION 61%
10.0000 mL | Freq: Once | INTRAMUSCULAR | Status: AC
  Administered 2021-05-08: 10 mL via INTRATHECAL

## 2021-05-08 MED ORDER — MEPERIDINE HCL 50 MG/ML IJ SOLN: 50.0000 mg | Freq: Once | INTRAMUSCULAR | Status: DC | PRN

## 2021-05-08 MED ORDER — DIAZEPAM 5 MG PO TABS
5.0000 mg | ORAL_TABLET | Freq: Once | ORAL | Status: AC
  Administered 2021-05-08: 5 mg via ORAL

## 2021-05-08 NOTE — Discharge Instructions (Signed)

## 2021-05-14 DIAGNOSIS — I10 Essential (primary) hypertension: Secondary | ICD-10-CM | POA: Diagnosis not present

## 2021-05-14 DIAGNOSIS — G5622 Lesion of ulnar nerve, left upper limb: Secondary | ICD-10-CM | POA: Diagnosis not present

## 2021-05-14 DIAGNOSIS — Z6829 Body mass index (BMI) 29.0-29.9, adult: Secondary | ICD-10-CM | POA: Diagnosis not present

## 2021-05-16 DIAGNOSIS — M25532 Pain in left wrist: Secondary | ICD-10-CM | POA: Diagnosis not present

## 2021-05-16 DIAGNOSIS — M25522 Pain in left elbow: Secondary | ICD-10-CM | POA: Diagnosis not present

## 2021-05-16 DIAGNOSIS — M542 Cervicalgia: Secondary | ICD-10-CM | POA: Diagnosis not present

## 2021-05-16 DIAGNOSIS — M62542 Muscle wasting and atrophy, not elsewhere classified, left hand: Secondary | ICD-10-CM | POA: Diagnosis not present

## 2021-05-21 DIAGNOSIS — M25532 Pain in left wrist: Secondary | ICD-10-CM | POA: Diagnosis not present

## 2021-05-21 DIAGNOSIS — M25522 Pain in left elbow: Secondary | ICD-10-CM | POA: Diagnosis not present

## 2021-05-21 DIAGNOSIS — M79642 Pain in left hand: Secondary | ICD-10-CM | POA: Diagnosis not present

## 2021-05-24 DIAGNOSIS — M25552 Pain in left hip: Secondary | ICD-10-CM | POA: Diagnosis not present

## 2021-05-24 DIAGNOSIS — M7062 Trochanteric bursitis, left hip: Secondary | ICD-10-CM | POA: Diagnosis not present

## 2021-05-29 DIAGNOSIS — M62542 Muscle wasting and atrophy, not elsewhere classified, left hand: Secondary | ICD-10-CM | POA: Diagnosis not present

## 2021-05-29 DIAGNOSIS — M542 Cervicalgia: Secondary | ICD-10-CM | POA: Diagnosis not present

## 2021-05-29 DIAGNOSIS — M25522 Pain in left elbow: Secondary | ICD-10-CM | POA: Diagnosis not present

## 2021-05-29 DIAGNOSIS — M13841 Other specified arthritis, right hand: Secondary | ICD-10-CM | POA: Diagnosis not present

## 2021-05-29 DIAGNOSIS — G5603 Carpal tunnel syndrome, bilateral upper limbs: Secondary | ICD-10-CM | POA: Diagnosis not present

## 2021-06-03 DIAGNOSIS — H43392 Other vitreous opacities, left eye: Secondary | ICD-10-CM | POA: Diagnosis not present

## 2021-06-03 DIAGNOSIS — H353221 Exudative age-related macular degeneration, left eye, with active choroidal neovascularization: Secondary | ICD-10-CM | POA: Diagnosis not present

## 2021-06-03 DIAGNOSIS — H2513 Age-related nuclear cataract, bilateral: Secondary | ICD-10-CM | POA: Diagnosis not present

## 2021-06-03 DIAGNOSIS — H40013 Open angle with borderline findings, low risk, bilateral: Secondary | ICD-10-CM | POA: Diagnosis not present

## 2021-06-03 DIAGNOSIS — H2512 Age-related nuclear cataract, left eye: Secondary | ICD-10-CM | POA: Diagnosis not present

## 2021-06-03 DIAGNOSIS — H25013 Cortical age-related cataract, bilateral: Secondary | ICD-10-CM | POA: Diagnosis not present

## 2021-06-11 DIAGNOSIS — H52221 Regular astigmatism, right eye: Secondary | ICD-10-CM | POA: Diagnosis not present

## 2021-06-11 DIAGNOSIS — H5211 Myopia, right eye: Secondary | ICD-10-CM | POA: Diagnosis not present

## 2021-06-11 DIAGNOSIS — H43392 Other vitreous opacities, left eye: Secondary | ICD-10-CM | POA: Diagnosis not present

## 2021-07-02 DIAGNOSIS — H2513 Age-related nuclear cataract, bilateral: Secondary | ICD-10-CM | POA: Diagnosis not present

## 2021-07-02 DIAGNOSIS — H353221 Exudative age-related macular degeneration, left eye, with active choroidal neovascularization: Secondary | ICD-10-CM | POA: Diagnosis not present

## 2021-07-02 DIAGNOSIS — H35413 Lattice degeneration of retina, bilateral: Secondary | ICD-10-CM | POA: Diagnosis not present

## 2021-07-02 DIAGNOSIS — H3581 Retinal edema: Secondary | ICD-10-CM | POA: Diagnosis not present

## 2021-07-03 DIAGNOSIS — M25552 Pain in left hip: Secondary | ICD-10-CM | POA: Diagnosis not present

## 2021-07-03 DIAGNOSIS — M25551 Pain in right hip: Secondary | ICD-10-CM | POA: Diagnosis not present

## 2021-07-03 DIAGNOSIS — M7061 Trochanteric bursitis, right hip: Secondary | ICD-10-CM | POA: Diagnosis not present

## 2021-07-03 DIAGNOSIS — M7062 Trochanteric bursitis, left hip: Secondary | ICD-10-CM | POA: Diagnosis not present

## 2021-08-06 DIAGNOSIS — E291 Testicular hypofunction: Secondary | ICD-10-CM | POA: Diagnosis not present

## 2021-08-06 DIAGNOSIS — E785 Hyperlipidemia, unspecified: Secondary | ICD-10-CM | POA: Diagnosis not present

## 2021-08-06 DIAGNOSIS — Z125 Encounter for screening for malignant neoplasm of prostate: Secondary | ICD-10-CM | POA: Diagnosis not present

## 2021-08-06 DIAGNOSIS — I10 Essential (primary) hypertension: Secondary | ICD-10-CM | POA: Diagnosis not present

## 2021-08-06 DIAGNOSIS — R7301 Impaired fasting glucose: Secondary | ICD-10-CM | POA: Diagnosis not present

## 2021-08-12 DIAGNOSIS — Z0000 Encounter for general adult medical examination without abnormal findings: Secondary | ICD-10-CM | POA: Diagnosis not present

## 2021-08-12 DIAGNOSIS — G5622 Lesion of ulnar nerve, left upper limb: Secondary | ICD-10-CM | POA: Diagnosis not present

## 2021-08-12 DIAGNOSIS — I10 Essential (primary) hypertension: Secondary | ICD-10-CM | POA: Diagnosis not present

## 2021-08-12 DIAGNOSIS — D751 Secondary polycythemia: Secondary | ICD-10-CM | POA: Diagnosis not present

## 2021-08-12 DIAGNOSIS — Z1331 Encounter for screening for depression: Secondary | ICD-10-CM | POA: Diagnosis not present

## 2021-08-12 DIAGNOSIS — Z1339 Encounter for screening examination for other mental health and behavioral disorders: Secondary | ICD-10-CM | POA: Diagnosis not present

## 2021-08-12 DIAGNOSIS — E298 Other testicular dysfunction: Secondary | ICD-10-CM | POA: Diagnosis not present

## 2021-08-12 DIAGNOSIS — R7301 Impaired fasting glucose: Secondary | ICD-10-CM | POA: Diagnosis not present

## 2021-08-12 DIAGNOSIS — F418 Other specified anxiety disorders: Secondary | ICD-10-CM | POA: Diagnosis not present

## 2021-08-12 DIAGNOSIS — E785 Hyperlipidemia, unspecified: Secondary | ICD-10-CM | POA: Diagnosis not present

## 2021-08-12 DIAGNOSIS — Z86010 Personal history of colonic polyps: Secondary | ICD-10-CM | POA: Diagnosis not present

## 2021-08-12 DIAGNOSIS — R42 Dizziness and giddiness: Secondary | ICD-10-CM | POA: Diagnosis not present

## 2021-08-12 DIAGNOSIS — M19041 Primary osteoarthritis, right hand: Secondary | ICD-10-CM | POA: Diagnosis not present

## 2021-08-12 DIAGNOSIS — G454 Transient global amnesia: Secondary | ICD-10-CM | POA: Diagnosis not present

## 2021-08-13 DIAGNOSIS — R82998 Other abnormal findings in urine: Secondary | ICD-10-CM | POA: Diagnosis not present

## 2021-08-15 DIAGNOSIS — M7061 Trochanteric bursitis, right hip: Secondary | ICD-10-CM | POA: Diagnosis not present

## 2021-08-15 DIAGNOSIS — M7062 Trochanteric bursitis, left hip: Secondary | ICD-10-CM | POA: Diagnosis not present

## 2021-08-20 DIAGNOSIS — Z23 Encounter for immunization: Secondary | ICD-10-CM | POA: Diagnosis not present

## 2021-08-20 DIAGNOSIS — H353221 Exudative age-related macular degeneration, left eye, with active choroidal neovascularization: Secondary | ICD-10-CM | POA: Diagnosis not present

## 2021-09-12 DIAGNOSIS — R066 Hiccough: Secondary | ICD-10-CM | POA: Diagnosis not present

## 2021-09-12 DIAGNOSIS — M79672 Pain in left foot: Secondary | ICD-10-CM | POA: Diagnosis not present

## 2021-09-24 DIAGNOSIS — H2513 Age-related nuclear cataract, bilateral: Secondary | ICD-10-CM | POA: Diagnosis not present

## 2021-09-24 DIAGNOSIS — H353221 Exudative age-related macular degeneration, left eye, with active choroidal neovascularization: Secondary | ICD-10-CM | POA: Diagnosis not present

## 2021-09-24 DIAGNOSIS — H35413 Lattice degeneration of retina, bilateral: Secondary | ICD-10-CM | POA: Diagnosis not present

## 2021-10-04 DIAGNOSIS — M25552 Pain in left hip: Secondary | ICD-10-CM | POA: Diagnosis not present

## 2021-10-04 DIAGNOSIS — M7062 Trochanteric bursitis, left hip: Secondary | ICD-10-CM | POA: Diagnosis not present

## 2021-10-09 DIAGNOSIS — R29898 Other symptoms and signs involving the musculoskeletal system: Secondary | ICD-10-CM | POA: Diagnosis not present

## 2021-10-10 DIAGNOSIS — M62542 Muscle wasting and atrophy, not elsewhere classified, left hand: Secondary | ICD-10-CM | POA: Diagnosis not present

## 2021-10-10 DIAGNOSIS — M13841 Other specified arthritis, right hand: Secondary | ICD-10-CM | POA: Diagnosis not present

## 2021-10-29 DIAGNOSIS — H2513 Age-related nuclear cataract, bilateral: Secondary | ICD-10-CM | POA: Diagnosis not present

## 2021-10-29 DIAGNOSIS — H353221 Exudative age-related macular degeneration, left eye, with active choroidal neovascularization: Secondary | ICD-10-CM | POA: Diagnosis not present

## 2021-10-29 DIAGNOSIS — H35413 Lattice degeneration of retina, bilateral: Secondary | ICD-10-CM | POA: Diagnosis not present

## 2021-11-01 DIAGNOSIS — H40051 Ocular hypertension, right eye: Secondary | ICD-10-CM | POA: Diagnosis not present

## 2021-11-01 DIAGNOSIS — H40021 Open angle with borderline findings, high risk, right eye: Secondary | ICD-10-CM | POA: Diagnosis not present

## 2021-11-01 DIAGNOSIS — H40011 Open angle with borderline findings, low risk, right eye: Secondary | ICD-10-CM | POA: Diagnosis not present

## 2021-11-04 DIAGNOSIS — I10 Essential (primary) hypertension: Secondary | ICD-10-CM | POA: Diagnosis not present

## 2021-11-04 DIAGNOSIS — R000 Tachycardia, unspecified: Secondary | ICD-10-CM | POA: Diagnosis not present

## 2021-11-04 DIAGNOSIS — I480 Paroxysmal atrial fibrillation: Secondary | ICD-10-CM | POA: Diagnosis not present

## 2021-11-04 DIAGNOSIS — R42 Dizziness and giddiness: Secondary | ICD-10-CM | POA: Diagnosis not present

## 2021-11-04 DIAGNOSIS — F418 Other specified anxiety disorders: Secondary | ICD-10-CM | POA: Diagnosis not present

## 2021-11-04 DIAGNOSIS — D751 Secondary polycythemia: Secondary | ICD-10-CM | POA: Diagnosis not present

## 2021-11-05 DIAGNOSIS — Z6828 Body mass index (BMI) 28.0-28.9, adult: Secondary | ICD-10-CM | POA: Diagnosis not present

## 2021-11-05 DIAGNOSIS — M62542 Muscle wasting and atrophy, not elsewhere classified, left hand: Secondary | ICD-10-CM | POA: Diagnosis not present

## 2021-11-06 DIAGNOSIS — M62542 Muscle wasting and atrophy, not elsewhere classified, left hand: Secondary | ICD-10-CM | POA: Diagnosis not present

## 2021-11-06 DIAGNOSIS — M4312 Spondylolisthesis, cervical region: Secondary | ICD-10-CM | POA: Diagnosis not present

## 2021-11-06 DIAGNOSIS — M4802 Spinal stenosis, cervical region: Secondary | ICD-10-CM | POA: Diagnosis not present

## 2021-11-07 NOTE — Progress Notes (Signed)
Cardiology Office Note:    Date:  11/08/2021   ID:  Jacob [name], DOB 1950-10-07, MRN [id number]  PCP:  Ginger Organ., MD  Cardiologist:  None  Electrophysiologist:  None   Referring MD: Ginger Organ., MD   Chief Complaint  Patient presents with   Palpitations   History of Present Illness:    Jacob [name] is a 71 y.o. male with a hx of hypertension, hyperlipidemia who is referred by Dr. Brigitte [name] for evaluation of atrial fibrillation.  He reported episodes of possible A-fib recorded on his watch.  He was started on Eliquis.  He reports that he has been having episodes where he felt like his heart was racing.  States that he gets lightheaded during episodes, can last up to 10 minutes.  Denies any syncope.  One episode was recorded as possible A-fib.  Denies any chest pain or dyspnea.  Reports he does not exercise.  No smoking history.  No history of heart disease in his immediate family.   Past Medical History:  Diagnosis Date   Depression    DJD (degenerative joint disease)    ED (erectile dysfunction) of organic origin    GERD (gastroesophageal reflux disease)    Hemorrhoids    History of adenomatous polyp of colon    12-11-2014  tubular adenoam, hyperplastic   Hyperlipidemia    Hypertension     Past Surgical History:  Procedure Laterality Date   ACHILLES TENDON REPAIR Right 11/2009   ruptured   ANTERIOR CERVICAL DECOMP/DISCECTOMY FUSION  04/23/2009   C7 -- T1   COLONOSCOPY WITH PROPOFOL N/A 12/11/2014   Procedure: COLONOSCOPY WITH PROPOFOL;  Surgeon: Jacob Fair, MD;  Location: [facility];  Service: Endoscopy;  Laterality: N/A;   FINGER ARTHRODESIS Right 02/06/2017   Procedure: ARTHRODESIS RIGHT INDEX FINGER DISTAL INTERPHALANGEAL JOINT;  Surgeon: Leanora Cover, MD;  Location: [place];  Service: Orthopedics;  Laterality: Right;   INGUINAL HERNIA REPAIR Bilateral left 03/18/2002/  right -- yrs ago   [place] N/A  04/03/2016   Procedure: PENILE PROTHESIS INFLATABLE;  Surgeon: Franchot Gallo, MD;  Location: [facility];  Service: Urology;  Laterality: N/A;   SHOULDER ARTHROSCOPY W/ ROTATOR CUFF REPAIR Right 2002 and 2012   SHOULDER ARTHROSCOPY WITH ROTATOR CUFF REPAIR AND SUBACROMIAL DECOMPRESSION Right 05/06/2012   Procedure: SHOULDER ARTHROSCOPY WITH ROTATOR CUFF REPAIR AND SUBACROMIAL DECOMPRESSION;  Surgeon: Ninetta Lights, MD;  Location: [place];  Service: Orthopedics;  Laterality: Right;  RIGHT SHOULDER ARTHROSCOPY  lysis of adhesions rotator cuff repair    SHOULDER ARTHROSCOPY/  DEBRIDEMENT LABRUM AND ROTATOR CUFF PARTIAL TEAR/  RESECTION BURSA/  ACROMIOPLASTY/  CORACOACROMINAL LIGAMENT RELEASE/  DCR Left 12/17/2001   SOFT TISSUE MASS EXCISION  06/25/2006   right forearm-- angiolipoma   TONSILLECTOMY AND ADENOIDECTOMY  child   TOTAL SHOULDER ARTHROPLASTY Right 02/04/2016    Current Medications: Current Meds  Medication Sig   Aluminum & Magnesium Hydroxide (MAGNESIUM-ALUMINUM PO) Take by mouth. Magnesium 3 tablets daily.   amLODipine (NORVASC) 10 MG tablet Take 10 mg by mouth daily in the afternoon.   anastrozole (ARIMIDEX) 1 MG tablet Take 1 mg by mouth daily.   atorvastatin (LIPITOR) 20 MG tablet Take 20 mg by mouth every morning.    baclofen (LIORESAL) 10 MG tablet Take 1 tablet by mouth 3 (three) times daily as needed.   buPROPion (WELLBUTRIN XL) 300 MG 24 hr tablet Take 300 mg  by mouth every morning.    celecoxib (CELEBREX) 200 MG capsule Take 200 mg by mouth daily.   EPINEPHrine 0.3 mg/0.3 mL IJ SOAJ injection    fluticasone (FLONASE) 50 MCG/ACT nasal spray Place into both nostrils as needed.   irbesartan (AVAPRO) 300 MG tablet Take 300 mg by mouth every morning.    Magnesium Gluconate 550 MG TABS Take 550 mg by mouth in the morning, at noon, and at bedtime.   omeprazole (PRILOSEC) 40 MG capsule Take 40 mg by mouth every morning.    tamsulosin (FLOMAX)  0.4 MG CAPS capsule Take 0.4 mg by mouth daily.   TESTOSTERONE IM Inject 1.5 mLs into the muscle every 14 (fourteen) days.   TRINTELLIX 10 MG TABS tablet Take 10 mg by mouth daily.   [DISCONTINUED] ELIQUIS 5 MG TABS tablet Take 5 mg by mouth 2 (two) times daily.     Allergies:   Vancomycin, Hydrocodone, Penicillins, and Percocet [oxycodone-acetaminophen]   Social History   Socioeconomic History   Marital status: Married    Spouse name: Not on file   Number of children: Not on file   Years of education: Not on file   Highest education level: Not on file  Occupational History   Not on file  Tobacco Use   Smoking status: Never   Smokeless tobacco: Never  Vaping Use   Vaping Use: Never used  Substance and Sexual Activity   Alcohol use: Yes    Comment: daily wine   Drug use: No   Sexual activity: Not on file  Other Topics Concern   Not on file  Social History Narrative   Not on file   Social Determinants of Health   Financial Resource Strain: Not on file  Food Insecurity: Not on file  Transportation Needs: Not on file  Physical Activity: Not on file  Stress: Not on file  Social Connections: Not on file     Family History: The patient's family history includes Parkinsonism in his father. There is no history of Colon cancer, Esophageal cancer, Rectal cancer, or Stomach cancer.  ROS:   Please see the history of present illness.     All other systems reviewed and are negative.  EKGs/Labs/Other Studies Reviewed:    The following studies were reviewed today:   EKG:   11/08/2021: Normal sinus rhythm, rate 81, no ST abnormalities  Recent Labs: No results found for requested labs within last 365 days.  Recent Lipid Panel No results found for: "CHOL", "TRIG", "HDL", "CHOLHDL", "VLDL", "LDLCALC", "LDLDIRECT"  Physical Exam:    VS:  BP 120/86 (BP Location: Left Arm, Patient Position: Sitting, Cuff Size: Normal)   [name] 81   Ht '5\' 7"'$  (1.702 m)   Wt 181 lb 9.6 oz (82.4  kg)   SpO2 96%   BMI 28.44 kg/m     Wt Readings from Last 3 Encounters:  11/08/21 181 lb 9.6 oz (82.4 kg)  02/13/21 170 lb (77.1 kg)  01/29/21 170 lb (77.1 kg)     GEN:  Well nourished, well developed in no acute distress HEENT: Normal NECK: No JVD; No carotid bruits LYMPHATICS: No lymphadenopathy CARDIAC: RRR, no murmurs, rubs, gallops RESPIRATORY:  Clear to auscultation without rales, wheezing or rhonchi  ABDOMEN: Soft, non-tender, non-distended MUSCULOSKELETAL:  No edema; No deformity  SKIN: Warm and dry NEUROLOGIC:  Alert and oriented x 3 PSYCHIATRIC:  Normal affect   ASSESSMENT:    1. Palpitations   2. Abnormal EKG   3. Essential hypertension  4. Hyperlipidemia, unspecified hyperlipidemia type    PLAN:    Palpitations: Apple Watch recorded possible episode of A-fib and he was started on Eliquis.  On review, appears sinus with frequent PACs.  Will stop Eliquis.  He is reporting significant palpitations/lightheadedness, will check Zio patch x2 weeks.  Check echocardiogram to rule out structural heart disease.  Hypertension: On irbesartan 300 mg daily and amlodipine 10 mg daily.  Appears controlled  Hyperlipidemia: On atorvastatin 20 mg daily.  LDL 91 on 08/06/2021   RTC in 4 months   Medication Adjustments/Labs and Tests Ordered: Current medicines are reviewed at length with the patient today.  Concerns regarding medicines are outlined above.  Orders Placed This Encounter  Procedures   LONG TERM MONITOR (3-14 DAYS)   EKG 12-Lead   ECHOCARDIOGRAM COMPLETE   No orders of the defined types were placed in this encounter.   Patient Instructions  Medication Instructions:  STOP Eliquis  *If you need a refill on your cardiac medications before your next appointment, please call your pharmacy*  Testing/Procedures: Your physician has requested that you have an echocardiogram. Echocardiography is a painless test that uses sound waves to create images of your heart.  It provides your doctor with information about the size and shape of your heart and how well your heart's chambers and valves are working. This procedure takes approximately one hour. There are no restrictions for this procedure.   ZIO XT- Long Term Monitor Instructions   Your physician has requested you wear a ZIO patch monitor for _14_ days.  This is a single patch monitor.   [employer] supplies one patch monitor per enrollment. Additional stickers are not available. Please do not apply patch if you will be having a Nuclear Stress Test, Echocardiogram, Cardiac CT, MRI, or Chest Xray during the period you would be wearing the monitor. The patch cannot be worn during these tests. You cannot remove and re-apply the ZIO XT patch monitor.  Your ZIO patch monitor will be sent Fed Ex from [employer] directly to your home address. It may take 3-5 days to receive your monitor after you have been enrolled.  Once you have received your monitor, please review the enclosed instructions. Your monitor has already been registered assigning a specific monitor serial # to you.  Billing and Patient Assistance Program Information   We have supplied [employer] with any of your insurance information on file for billing purposes. [employer] offers a sliding scale Patient Assistance Program for patients that do not have insurance, or whose insurance does not completely cover the cost of the ZIO monitor.   You must apply for the Patient Assistance Program to qualify for this discounted rate.     To apply, please call [employer] at [phone], select option 4, then select option 2, and ask to apply for Patient Assistance Program.  Theodore Demark will ask your household income, and how many people are in your household.  They will quote your out-of-pocket cost based on that information.  [employer] will also be able to set up a 34-month interest-free payment plan if needed.  Applying the monitor   Shave hair from upper left chest.   Hold abrader disc by orange tab. Rub abrader in 40 strokes over the upper left chest as indicated in your monitor instructions.  Clean area with 4 enclosed alcohol pads. Let dry.  Apply patch as indicated in monitor instructions. Patch will be placed under collarbone on left side of chest with arrow pointing upward.  Rub  patch adhesive wings for 2 minutes. Remove white label marked "1". Remove the white label marked "2". Rub patch adhesive wings for 2 additional minutes.  While looking in a mirror, press and release button in center of patch. A small green light will flash 3-4 times. This will be your only indicator that the monitor has been turned on. ?  Do not shower for the first 24 hours. You may shower after the first 24 hours.  Press the button if you feel a symptom. You will hear a small click. Record Date, Time and Symptom in the Patient Logbook.  When you are ready to remove the patch, follow instructions on the last 2 pages of the Patient Logbook. Stick patch monitor onto the last page of Patient Logbook.  Place Patient Logbook in the blue and white box.  Use locking tab on box and tape box closed securely.  The blue and white box has prepaid postage on it. Please place it in the mailbox as soon as possible. Your physician should have your test results approximately 7 days after the monitor has been mailed back to [facility].  Call [facility] at [phone] if you have questions regarding your ZIO XT patch monitor. Call them immediately if you see an orange light blinking on your monitor.  If your monitor falls off in less than 4 days, contact our Monitor department at [phone]. ?If your monitor becomes loose or falls off after 4 days call [employer] at [phone] for suggestions on securing your monitor.?  Follow-Up: At [facility], you and your health needs are our priority.  As part of our continuing mission to provide you with exceptional heart  care, we have created designated Provider Care Teams.  These Care Teams include your primary Cardiologist (physician) and Advanced Practice Providers (APPs -  Physician Assistants and Nurse Practitioners) who all work together to provide you with the care you need, when you need it.  We recommend signing up for the patient portal called "MyChart".  Sign up information is provided on this After Visit Summary.  MyChart is used to connect with patients for Virtual Visits (Telemedicine).  Patients are able to view lab/test results, encounter notes, upcoming appointments, etc.  Non-urgent messages can be sent to your provider as well.   To learn more about what you can do with MyChart, go to [email or web address].    Your next appointment:   4 month(s)  The format for your next appointment:   In Person  Provider:   Dr. Gardiner Rhyme   Important Information About Sugar         Signed, Donato Heinz, MD  11/08/2021 11:01 AM    [place]

## 2021-11-08 ENCOUNTER — Ambulatory Visit: Payer: Medicare Other | Attending: Cardiology | Admitting: Cardiology

## 2021-11-08 ENCOUNTER — Ambulatory Visit: Payer: Medicare Other | Attending: Cardiology

## 2021-11-08 ENCOUNTER — Encounter: Payer: Self-pay | Admitting: Cardiology

## 2021-11-08 VITALS — BP 120/86 | HR 81 | Ht 67.0 in | Wt 181.6 lb

## 2021-11-08 DIAGNOSIS — E785 Hyperlipidemia, unspecified: Secondary | ICD-10-CM | POA: Diagnosis not present

## 2021-11-08 DIAGNOSIS — R002 Palpitations: Secondary | ICD-10-CM

## 2021-11-08 DIAGNOSIS — I10 Essential (primary) hypertension: Secondary | ICD-10-CM | POA: Insufficient documentation

## 2021-11-08 DIAGNOSIS — R9431 Abnormal electrocardiogram [ECG] [EKG]: Secondary | ICD-10-CM

## 2021-11-08 NOTE — Progress Notes (Unsigned)
Enrolled for [employer] to mail a ZIO XT long term holter monitor to the patients address on file.  

## 2021-11-08 NOTE — Patient Instructions (Addendum)
Medication Instructions:  STOP Eliquis  *If you need a refill on your cardiac medications before your next appointment, please call your pharmacy*  Testing/Procedures: Your physician has requested that you have an echocardiogram. Echocardiography is a painless test that uses sound waves to create images of your heart. It provides your doctor with information about the size and shape of your heart and how well your heart's chambers and valves are working. This procedure takes approximately one hour. There are no restrictions for this procedure.   ZIO XT- Long Term Monitor Instructions   Your physician has requested you wear a ZIO patch monitor for _14_ days.  This is a single patch monitor.   [employer] supplies one patch monitor per enrollment. Additional stickers are not available. Please do not apply patch if you will be having a Nuclear Stress Test, Echocardiogram, Cardiac CT, MRI, or Chest Xray during the period you would be wearing the monitor. The patch cannot be worn during these tests. You cannot remove and re-apply the ZIO XT patch monitor.  Your ZIO patch monitor will be sent Fed Ex from [employer] directly to your home address. It may take 3-5 days to receive your monitor after you have been enrolled.  Once you have received your monitor, please review the enclosed instructions. Your monitor has already been registered assigning a specific monitor serial # to you.  Billing and Patient Assistance Program Information   We have supplied [employer] with any of your insurance information on file for billing purposes. [employer] offers a sliding scale Patient Assistance Program for patients that do not have insurance, or whose insurance does not completely cover the cost of the ZIO monitor.   You must apply for the Patient Assistance Program to qualify for this discounted rate.     To apply, please call [employer] at [phone], select option 4, then select option 2, and ask to apply for  Patient Assistance Program.  Theodore Demark will ask your household income, and how many people are in your household.  They will quote your out-of-pocket cost based on that information.  [employer] will also be able to set up a 49-month interest-free payment plan if needed.  Applying the monitor   Shave hair from upper left chest.  Hold abrader disc by orange tab. Rub abrader in 40 strokes over the upper left chest as indicated in your monitor instructions.  Clean area with 4 enclosed alcohol pads. Let dry.  Apply patch as indicated in monitor instructions. Patch will be placed under collarbone on left side of chest with arrow pointing upward.  Rub patch adhesive wings for 2 minutes. Remove white label marked "1". Remove the white label marked "2". Rub patch adhesive wings for 2 additional minutes.  While looking in a mirror, press and release button in center of patch. A small green light will flash 3-4 times. This will be your only indicator that the monitor has been turned on. ?  Do not shower for the first 24 hours. You may shower after the first 24 hours.  Press the button if you feel a symptom. You will hear a small click. Record Date, Time and Symptom in the Patient Logbook.  When you are ready to remove the patch, follow instructions on the last 2 pages of the Patient Logbook. Stick patch monitor onto the last page of Patient Logbook.  Place Patient Logbook in the blue and white box.  Use locking tab on box and tape box closed securely.  The blue and  white box has prepaid postage on it. Please place it in the mailbox as soon as possible. Your physician should have your test results approximately 7 days after the monitor has been mailed back to [facility]  Call [facility] at [phone] if you have questions regarding your ZIO XT patch monitor. Call them immediately if you see an orange light blinking on your monitor.  If your monitor falls off in less than 4 days, contact our  Monitor department at [phone]. ?If your monitor becomes loose or falls off after 4 days call [employer] at [phone] for suggestions on securing your monitor.?  Follow-Up: At [facility], you and your health needs are our priority.  As part of our continuing mission to provide you with exceptional heart care, we have created designated Provider Care Teams.  These Care Teams include your primary Cardiologist (physician) and Advanced Practice Providers (APPs -  Physician Assistants and Nurse Practitioners) who all work together to provide you with the care you need, when you need it.  We recommend signing up for the patient portal called "MyChart".  Sign up information is provided on this After Visit Summary.  MyChart is used to connect with patients for Virtual Visits (Telemedicine).  Patients are able to view lab/test results, encounter notes, upcoming appointments, etc.  Non-urgent messages can be sent to your provider as well.   To learn more about what you can do with MyChart, go to [email or web address].    Your next appointment:   4 month(s)  The format for your next appointment:   In Person  Provider:   Dr. Gardiner Rhyme   Important Information About Sugar

## 2021-11-09 ENCOUNTER — Encounter: Payer: Self-pay | Admitting: Cardiology

## 2021-11-14 DIAGNOSIS — G5622 Lesion of ulnar nerve, left upper limb: Secondary | ICD-10-CM | POA: Diagnosis not present

## 2021-11-14 DIAGNOSIS — M7062 Trochanteric bursitis, left hip: Secondary | ICD-10-CM | POA: Diagnosis not present

## 2021-11-14 DIAGNOSIS — M7061 Trochanteric bursitis, right hip: Secondary | ICD-10-CM | POA: Diagnosis not present

## 2021-11-14 DIAGNOSIS — Z6828 Body mass index (BMI) 28.0-28.9, adult: Secondary | ICD-10-CM | POA: Diagnosis not present

## 2021-11-20 DIAGNOSIS — G5622 Lesion of ulnar nerve, left upper limb: Secondary | ICD-10-CM | POA: Diagnosis not present

## 2021-11-21 DIAGNOSIS — L821 Other seborrheic keratosis: Secondary | ICD-10-CM | POA: Diagnosis not present

## 2021-11-21 DIAGNOSIS — Z85828 Personal history of other malignant neoplasm of skin: Secondary | ICD-10-CM | POA: Diagnosis not present

## 2021-11-21 DIAGNOSIS — L570 Actinic keratosis: Secondary | ICD-10-CM | POA: Diagnosis not present

## 2021-11-21 DIAGNOSIS — D1801 Hemangioma of skin and subcutaneous tissue: Secondary | ICD-10-CM | POA: Diagnosis not present

## 2021-11-21 DIAGNOSIS — L820 Inflamed seborrheic keratosis: Secondary | ICD-10-CM | POA: Diagnosis not present

## 2021-11-21 DIAGNOSIS — D485 Neoplasm of uncertain behavior of skin: Secondary | ICD-10-CM | POA: Diagnosis not present

## 2021-11-21 DIAGNOSIS — L812 Freckles: Secondary | ICD-10-CM | POA: Diagnosis not present

## 2021-11-22 ENCOUNTER — Ambulatory Visit (HOSPITAL_COMMUNITY): Payer: Medicare Other | Attending: Cardiology

## 2021-11-22 DIAGNOSIS — R9431 Abnormal electrocardiogram [ECG] [EKG]: Secondary | ICD-10-CM | POA: Insufficient documentation

## 2021-11-22 DIAGNOSIS — R002 Palpitations: Secondary | ICD-10-CM | POA: Diagnosis not present

## 2021-11-22 LAB — ECHOCARDIOGRAM COMPLETE
Area-P 1/2: 3.93 cm2
S' Lateral: 2.9 cm

## 2021-11-24 DIAGNOSIS — R002 Palpitations: Secondary | ICD-10-CM | POA: Diagnosis not present

## 2021-11-29 ENCOUNTER — Telehealth: Payer: Self-pay | Admitting: Cardiology

## 2021-11-29 NOTE — Telephone Encounter (Signed)
STAT if HR is under 50 or over 120 (normal HR is 60-100 beats per minute)  What is your heart rate? 49, 112  Do you have a log of your heart rate readings (document readings)?    Do you have any other symptoms? Patient states that his watch has told him that he has been in afib   Pt c/o Shortness Of Breath: STAT if SOB developed within the last 24 hours or pt is noticeably SOB on the phone  1. Are you currently SOB (can you hear that pt is SOB on the phone)? yes  2. How long have you been experiencing SOB? Couple   3. Are you SOB when sitting or when up moving around? Both   4. Are you currently experiencing any other symptoms?  Lightheadedness

## 2021-11-29 NOTE — Telephone Encounter (Signed)
Patient is calling . He states he has just completed  wearing a 6 day Zio monitor . He will be mailing it today.  Patient states  he  has some dizziness  and shortness of breath. Especially when goign up stairs , he has to stop to catch his breath.   Patient states his apple watch has  recorded  4 episodes of Afib. Heart range betwwen 49 to 110's   He wants to know what to do . He does not want to wait the 7-10 days  for the results ,if he is feeling short of breath.   Will defer to Dr Gardiner Rhyme ?

## 2021-11-29 NOTE — Telephone Encounter (Signed)
Follow Up:   Patient says he still have not heard anyting and he called about 3 hrs ago.  He says he is  not getting any better, real lightheaded,

## 2021-11-29 NOTE — Telephone Encounter (Signed)
Spoke with pt, he states that he is still having dizziness and that his heart rate has not been over 60bpm today. Pt is hoping we can get back to him before end of day. Will send over to Dr. Gardiner Rhyme to advise.

## 2021-12-01 NOTE — Telephone Encounter (Signed)
Spoke with patient.  He reports that he has been having intermittent lightheadedness and shortness of breath.  No syncope.  Echocardiogram unremarkable.  He turned in monitor and had symptoms while wearing, we will follow-up results

## 2021-12-04 ENCOUNTER — Ambulatory Visit (INDEPENDENT_AMBULATORY_CARE_PROVIDER_SITE_OTHER): Payer: Medicare Other | Admitting: Internal Medicine

## 2021-12-04 ENCOUNTER — Telehealth: Payer: Self-pay

## 2021-12-04 ENCOUNTER — Inpatient Hospital Stay (HOSPITAL_BASED_OUTPATIENT_CLINIC_OR_DEPARTMENT_OTHER)
Admission: EM | Admit: 2021-12-04 | Discharge: 2021-12-06 | DRG: 176 | Disposition: A | Discharge status: Home / Self Care | Payer: Medicare Other | Attending: Internal Medicine | Admitting: Internal Medicine

## 2021-12-04 ENCOUNTER — Encounter (HOSPITAL_COMMUNITY): Payer: Self-pay

## 2021-12-04 ENCOUNTER — Encounter: Payer: Self-pay | Admitting: Internal Medicine

## 2021-12-04 ENCOUNTER — Emergency Department (HOSPITAL_BASED_OUTPATIENT_CLINIC_OR_DEPARTMENT_OTHER): Payer: Medicare Other

## 2021-12-04 ENCOUNTER — Encounter (HOSPITAL_BASED_OUTPATIENT_CLINIC_OR_DEPARTMENT_OTHER): Payer: Self-pay

## 2021-12-04 ENCOUNTER — Other Ambulatory Visit: Payer: Self-pay

## 2021-12-04 DIAGNOSIS — Z79899 Other long term (current) drug therapy: Secondary | ICD-10-CM

## 2021-12-04 DIAGNOSIS — F32A Depression, unspecified: Secondary | ICD-10-CM | POA: Diagnosis not present

## 2021-12-04 DIAGNOSIS — K5730 Diverticulosis of large intestine without perforation or abscess without bleeding: Secondary | ICD-10-CM | POA: Diagnosis not present

## 2021-12-04 DIAGNOSIS — I2699 Other pulmonary embolism without acute cor pulmonale: Secondary | ICD-10-CM | POA: Diagnosis not present

## 2021-12-04 DIAGNOSIS — E291 Testicular hypofunction: Secondary | ICD-10-CM | POA: Diagnosis present

## 2021-12-04 DIAGNOSIS — I82431 Acute embolism and thrombosis of right popliteal vein: Secondary | ICD-10-CM | POA: Diagnosis not present

## 2021-12-04 DIAGNOSIS — R002 Palpitations: Secondary | ICD-10-CM | POA: Insufficient documentation

## 2021-12-04 DIAGNOSIS — N281 Cyst of kidney, acquired: Secondary | ICD-10-CM | POA: Diagnosis not present

## 2021-12-04 DIAGNOSIS — I10 Essential (primary) hypertension: Secondary | ICD-10-CM | POA: Insufficient documentation

## 2021-12-04 DIAGNOSIS — I2692 Saddle embolus of pulmonary artery without acute cor pulmonale: Secondary | ICD-10-CM | POA: Diagnosis not present

## 2021-12-04 DIAGNOSIS — Z7982 Long term (current) use of aspirin: Secondary | ICD-10-CM

## 2021-12-04 DIAGNOSIS — I82409 Acute embolism and thrombosis of unspecified deep veins of unspecified lower extremity: Secondary | ICD-10-CM | POA: Diagnosis present

## 2021-12-04 DIAGNOSIS — F39 Unspecified mood [affective] disorder: Secondary | ICD-10-CM | POA: Diagnosis not present

## 2021-12-04 DIAGNOSIS — I82411 Acute embolism and thrombosis of right femoral vein: Secondary | ICD-10-CM | POA: Diagnosis present

## 2021-12-04 DIAGNOSIS — E785 Hyperlipidemia, unspecified: Secondary | ICD-10-CM | POA: Diagnosis present

## 2021-12-04 DIAGNOSIS — K219 Gastro-esophageal reflux disease without esophagitis: Secondary | ICD-10-CM | POA: Diagnosis present

## 2021-12-04 DIAGNOSIS — I482 Chronic atrial fibrillation, unspecified: Secondary | ICD-10-CM

## 2021-12-04 DIAGNOSIS — Z79811 Long term (current) use of aromatase inhibitors: Secondary | ICD-10-CM

## 2021-12-04 DIAGNOSIS — Z96611 Presence of right artificial shoulder joint: Secondary | ICD-10-CM | POA: Diagnosis present

## 2021-12-04 HISTORY — DX: Acute embolism and thrombosis of unspecified deep veins of unspecified lower extremity: I82.409

## 2021-12-04 LAB — CBC WITH DIFFERENTIAL/PLATELET
Abs Immature Granulocytes: 0.02 10*3/uL (ref 0.00–0.07)
Basophils Absolute: 0 10*3/uL (ref 0.0–0.1)
Basophils Relative: 1 %
Eosinophils Absolute: 0 10*3/uL (ref 0.0–0.5)
Eosinophils Relative: 1 %
HCT: 47.6 % (ref 39.0–52.0)
Hemoglobin: 16.7 g/dL (ref 13.0–17.0)
Immature Granulocytes: 0 %
Lymphocytes Relative: 16 %
Lymphs Abs: 1 10*3/uL (ref 0.7–4.0)
MCH: 33.2 pg (ref 26.0–34.0)
MCHC: 35.1 g/dL (ref 30.0–36.0)
MCV: 94.6 fL (ref 80.0–100.0)
Monocytes Absolute: 0.8 10*3/uL (ref 0.1–1.0)
Monocytes Relative: 12 %
Neutro Abs: 4.4 10*3/uL (ref 1.7–7.7)
Neutrophils Relative %: 70 %
Platelets: 151 10*3/uL (ref 150–400)
RBC: 5.03 MIL/uL (ref 4.22–5.81)
RDW: 13.7 % (ref 11.5–15.5)
WBC: 6.3 10*3/uL (ref 4.0–10.5)
nRBC: 0 % (ref 0.0–0.2)

## 2021-12-04 LAB — BASIC METABOLIC PANEL
Anion gap: 13 (ref 5–15)
BUN: 20 mg/dL (ref 8–23)
CO2: 26 mmol/L (ref 22–32)
Calcium: 9.5 mg/dL (ref 8.9–10.3)
Chloride: 100 mmol/L (ref 98–111)
Creatinine, Ser: 1.03 mg/dL (ref 0.61–1.24)
GFR, Estimated: >60 mL/min (ref >60)
Glucose, Bld: 84 mg/dL (ref 70–99)
Potassium: 4.3 mmol/L (ref 3.5–5.1)
Sodium: 139 mmol/L (ref 135–145)

## 2021-12-04 LAB — PROTIME-INR
INR: 1.1 (ref 0.8–1.2)
Prothrombin Time: 13.9 seconds (ref 11.4–15.2)

## 2021-12-04 MED ORDER — METOPROLOL TARTRATE 100 MG PO TABS: ORAL_TABLET | ORAL | 0 refills | Status: DC

## 2021-12-04 MED ORDER — HEPARIN (PORCINE) 25000 UT/250ML-% IV SOLN
1300.0000 [IU]/h | INTRAVENOUS | Status: AC
  Administered 2021-12-04: 1400 [IU]/h via INTRAVENOUS
  Administered 2021-12-05: 1200 [IU]/h via INTRAVENOUS
  Administered 2021-12-06: 1300 [IU]/h via INTRAVENOUS
  Filled 2021-12-04 (×3): qty 250

## 2021-12-04 MED ORDER — IOHEXOL 350 MG/ML SOLN
80.0000 mL | Freq: Once | INTRAVENOUS | Status: AC | PRN
  Administered 2021-12-04: 80 mL via INTRAVENOUS

## 2021-12-04 MED ORDER — HEPARIN BOLUS VIA INFUSION
5500.0000 [IU] | Freq: Once | INTRAVENOUS | Status: AC
  Administered 2021-12-04: 5500 [IU] via INTRAVENOUS

## 2021-12-04 NOTE — Telephone Encounter (Signed)
Pt called stating he has unexplained right leg edema.    Pt came into see Dr. Lovena Le at [facility] on [facility] [address] this morning 12/04/21.  Pt stated he noticed mild right ankle edema, but did not think it was a concern when meeting with Dr. Lovena Le.  Pt states as the morning went into the afternoon, the edema has spread up his right leg and can feel it in his thigh.  He says it is painful to bend his right ankle and right knee due to the edema present, and his right leg is warm to the touch.    I advised the patient to go to the nearest ER for a providers assessment, possibly to r/o a DVT or other cardiovascular concern.  I educated the patient on why it was important to get this concern addressed tonight.  Pt given the address to [facility], due to lower wait times and to expedite care.  Pt understood education, and stated he will go get checked.

## 2021-12-04 NOTE — Progress Notes (Signed)
ANTICOAGULATION CONSULT NOTE - Initial Consult  Pharmacy Consult for Heparin infusion Indication: pulmonary embolus and DVT  Allergies  Allergen Reactions   Vancomycin Other (See Comments)    Hypotension    Hydrocodone Other (See Comments)    "jumps out of skin"   Penicillins Other (See Comments)    Unknown childhood reaction  Has patient had aCN reaction causing immediate rash, facial/tongue/throat swelling, SOB or lightheadedness with hypotension: No Has patient had a PCN reaction causing severe rash involving mucus membranes or skin necrosis: No Has patient had a PCN reaction that required hospitalization No Has patient had a PCN reaction occurring within the last 10 years: No If all of the above answers are "NO", then may proceed with Cephalosporin use.   Percocet [Oxycodone-Acetaminophen] Other (See Comments)    "jumps out of skin"    Patient Measurements: Height: '5\' 7"'$  (170.2 cm) Weight: 82.1 kg (181 lb) IBW/kg (Calculated) : 66.1 Heparin Dosing Weight: 82.1  Vital Signs: Temp: 97.7 F (36.5 C) (09/27 2021) BP: 150/95 (09/27 2100) Pulse Rate: 86 (09/27 2100)  Labs: Recent Labs    12/04/21 2013  HGB 16.7  HCT 47.6  PLT 151  LABPROT 13.9  INR 1.1  CREATININE 1.03    Estimated Creatinine Clearance: 69 mL/min (by C-G formula based on SCr of 1.03 mg/dL).   Medical History: Past Medical History:  Diagnosis Date   Depression    DJD (degenerative joint disease)    ED (erectile dysfunction) of organic origin    GERD (gastroesophageal reflux disease)    Hemorrhoids    History of adenomatous polyp of colon    12-11-2014  tubular adenoam, hyperplastic   Hyperlipidemia    Hypertension     Medications:  (Not in a hospital admission)   Assessment: 71 yo M presents with swelling and pain in RLE. Found to have RLE DVT and saddle PE. Patient was not taking any anticoagulation prior to admission. Pharmacy consulted to dose and manage heparin infusion for  treatment of VTE.   Hgb 16.7, Plt 151  Goal of Therapy:  Heparin level 0.3-0.7 units/ml Monitor platelets by anticoagulation protocol: Yes   Plan:  Give 5500 units bolus x 1 Start heparin infusion at 1400 units/hr Check heparin level in 6 hours Monitor daily CBC, heparin level, and for s/sx of bleeding  Luisa Hart, PharmD, BCPS Clinical Pharmacist 12/04/2021 10:10 PM   Please refer to [facility]ION for pharmacy phone number

## 2021-12-04 NOTE — ED Notes (Signed)
Patient transported to CT 

## 2021-12-04 NOTE — Progress Notes (Unsigned)
HPI Jacob [name] is referred by Dr. Olevia Perches for EP evaluation of palpitations. He has recently seen my partner Dr. Gardiner Rhyme. The patient is a pleasant 71 yo man with a h/o HTN and dyslipidemia, also with some arthritic difficulties who has had palpitations associated with lightheadedness. First episode back in February while on a boat. May have been dehydrated, and then another also at beach in June. Since the start of September, he has had frequent periods where he feels palpitations and has an Apple watch which showed "atrial fib." However review suggested NSR with PAC's. In the last week he has had sob with exertion. Walking up a hill gets him winded when previously it did not. He had an echo with normal LV and RV function and normal valves. No chest pain. No edema. No recent travel or trauma. No syncope.  Allergies  Allergen Reactions   Vancomycin Other (See Comments)    Hypotension    Hydrocodone Other (See Comments)    "jumps out of skin"   Penicillins Other (See Comments)    Unknown childhood reaction  Has patient had aCN reaction causing immediate rash, facial/tongue/throat swelling, SOB or lightheadedness with hypotension: No Has patient had a PCN reaction causing severe rash involving mucus membranes or skin necrosis: No Has patient had a PCN reaction that required hospitalization No Has patient had a PCN reaction occurring within the last 10 years: No If all of the above answers are "NO", then may proceed with Cephalosporin use.   Percocet [Oxycodone-Acetaminophen] Other (See Comments)    "jumps out of skin"     Current Outpatient Medications  Medication Sig Dispense Refill   Aluminum & Magnesium Hydroxide (MAGNESIUM-ALUMINUM PO) Take by mouth. Magnesium 3 tablets daily.     amLODipine (NORVASC) 10 MG tablet Take 10 mg by mouth daily in the afternoon.     anastrozole (ARIMIDEX) 1 MG tablet Take 1 mg by mouth daily.     aspirin EC 81 MG tablet Take 81 mg by mouth daily.  Swallow whole.     atorvastatin (LIPITOR) 20 MG tablet Take 20 mg by mouth every morning.      baclofen (LIORESAL) 10 MG tablet Take 1 tablet by mouth 3 (three) times daily as needed.     buPROPion (WELLBUTRIN XL) 300 MG 24 hr tablet Take 300 mg by mouth every morning.      celecoxib (CELEBREX) 200 MG capsule Take 200 mg by mouth daily.     EPINEPHrine 0.3 mg/0.3 mL IJ SOAJ injection      fluticasone (FLONASE) 50 MCG/ACT nasal spray Place into both nostrils as needed.     irbesartan (AVAPRO) 300 MG tablet Take 300 mg by mouth every morning.      Magnesium Gluconate 550 MG TABS Take 550 mg by mouth in the morning, at noon, and at bedtime.     omeprazole (PRILOSEC) 40 MG capsule Take 40 mg by mouth every morning.      oxybutynin (DITROPAN-XL) 10 MG 24 hr tablet Take 10 mg by mouth every morning.  (Patient not taking: Reported on 01/29/2021)     tamsulosin (FLOMAX) 0.4 MG CAPS capsule Take 0.4 mg by mouth daily.     TESTOSTERONE IM Inject 1.5 mLs into the muscle every 14 (fourteen) days.     TRINTELLIX 10 MG TABS tablet Take 10 mg by mouth daily.     valACYclovir (VALTREX) 500 MG tablet Take 500 mg by mouth as needed. (Patient not taking: Reported on  11/08/2021)     No current facility-administered medications for this visit.     Past Medical History:  Diagnosis Date   Depression    DJD (degenerative joint disease)    ED (erectile dysfunction) of organic origin    GERD (gastroesophageal reflux disease)    Hemorrhoids    History of adenomatous polyp of colon    12-11-2014  tubular adenoam, hyperplastic   Hyperlipidemia    Hypertension     ROS:   All systems reviewed and negative except as noted in the HPI.   Past Surgical History:  Procedure Laterality Date   ACHILLES TENDON REPAIR Right 11/2009   ruptured   ANTERIOR CERVICAL DECOMP/DISCECTOMY FUSION  04/23/2009   C7 -- T1   COLONOSCOPY WITH PROPOFOL N/A 12/11/2014   Procedure: COLONOSCOPY WITH PROPOFOL;  Surgeon: Garlan Fair, MD;  Location: [facility];  Service: Endoscopy;  Laterality: N/A;   FINGER ARTHRODESIS Right 02/06/2017   Procedure: ARTHRODESIS RIGHT INDEX FINGER DISTAL INTERPHALANGEAL JOINT;  Surgeon: Leanora Cover, MD;  Location: [facility];  Service: Orthopedics;  Laterality: Right;   INGUINAL HERNIA REPAIR Bilateral left 03/18/2002/  right -- yrs ago   [place] N/A 04/03/2016   Procedure: PENILE PROTHESIS INFLATABLE;  Surgeon: Franchot Gallo, MD;  Location: [facility];  Service: Urology;  Laterality: N/A;   SHOULDER ARTHROSCOPY W/ ROTATOR CUFF REPAIR Right 2002 and 2012   SHOULDER ARTHROSCOPY WITH ROTATOR CUFF REPAIR AND SUBACROMIAL DECOMPRESSION Right 05/06/2012   Procedure: SHOULDER ARTHROSCOPY WITH ROTATOR CUFF REPAIR AND SUBACROMIAL DECOMPRESSION;  Surgeon: Ninetta Lights, MD;  Location: [facility];  Service: Orthopedics;  Laterality: Right;  RIGHT SHOULDER ARTHROSCOPY  lysis of adhesions rotator cuff repair    SHOULDER ARTHROSCOPY/  DEBRIDEMENT LABRUM AND ROTATOR CUFF PARTIAL TEAR/  RESECTION BURSA/  ACROMIOPLASTY/  CORACOACROMINAL LIGAMENT RELEASE/  DCR Left 12/17/2001   SOFT TISSUE MASS EXCISION  06/25/2006   right forearm-- angiolipoma   TONSILLECTOMY AND ADENOIDECTOMY  child   TOTAL SHOULDER ARTHROPLASTY Right 02/04/2016     Family History  Problem Relation Age of Onset   Parkinsonism Father    Colon cancer Neg Hx    Esophageal cancer Neg Hx    Rectal cancer Neg Hx    Stomach cancer Neg Hx      Social History   Socioeconomic History   Marital status: Married    Spouse name: Not on file   Number of children: Not on file   Years of education: Not on file   Highest education level: Not on file  Occupational History   Not on file  Tobacco Use   Smoking status: Never   Smokeless tobacco: Never  Vaping Use   Vaping Use: Never used  Substance and Sexual Activity   Alcohol use: Yes    Comment: daily wine    Drug use: No   Sexual activity: Not on file  Other Topics Concern   Not on file  Social History Narrative   Not on file   Social Determinants of Health   Financial Resource Strain: Not on file  Food Insecurity: Not on file  Transportation Needs: Not on file  Physical Activity: Not on file  Stress: Not on file  Social Connections: Not on file  Intimate Partner Violence: Not on file     BP 138/76   Pulse 76   Wt 181 lb (82.1 kg)   SpO2 95%   BMI 28.35 kg/m   Physical Exam:  Well appearing NAD HEENT: Unremarkable Neck:  No JVD, no thyromegally Lymphatics:  No adenopathy Back:  No CVA tenderness Lungs:  Clear with no wheezes HEART:  IRegular rate rhythm, no murmurs, no rubs, no clicks Abd:  soft, positive bowel sounds, no organomegally, no rebound, no guarding Ext:  2 plus pulses, no edema, no cyanosis, no clubbing Skin:  No rashes no nodules Neuro:  CN II through XII intact, motor grossly intact  EKG - reviewed. NSR with IRBBB and LAE  Assess/Plan:  Palpitations - we await the results of his Zio monitor. I suspect PAC's or PVC's. If atrial fib will restart eliquis. Might consider flecainide based on the zio but will see. Sob - this is new. I initially planned a coronary calcium score but after discussing with Dr. Gardiner Rhyme, we will plan a coronary CTA.  HTN - his bp is fairly well controlled. Might consider switching the amlodipine to a beta blocker to also treat his ectopy in the future.  Dyslipidemia - continue lipitor.  Jacob Overlie Ric Rosenberg,MD

## 2021-12-04 NOTE — ED Triage Notes (Signed)
Patient here POV from Home.  Endorses RLE Swelling for approximately 24-36 Hours.  No Anticoagulants. No Known Fevers. Palpable Strong Pulse Distally.  NAD Noted during Triage. A&Ox4. GCS 15. Ambulatory.

## 2021-12-04 NOTE — ED Provider Notes (Signed)
[facility] EMERGENCY DEPT Provider Note   CSN: [id number] Arrival date & time: 12/04/21  1704     History  Chief Complaint  Patient presents with   Leg Swelling    Jacob [name] is a 71 y.o. male.  HPI     71 year old male comes in with chief complaint of leg swelling. Patient has history of hypertension, hyperlipidemia.  He comes to the ER because of leg swelling in the right lower extremity that he noticed yesterday morning, but it has advanced today fairly rapidly.  Swelling was just in his ankle yesterday but today he feels that the swelling is in his thigh as well.  No history of DVT, PE.  Patient denies any recent long distance travels, surgeries.  Additionally, patient does indicate that over the last 2 or 3 weeks he has noticed some shortness of breath.  Shortness of breath is intermittent, sometimes unprovoked.  He is noticing that he is being winded when he is climbing stairs.  No chest pain.  Home Medications Prior to Admission medications   Medication Sig Start Date End Date Taking? Authorizing Provider  Aluminum & Magnesium Hydroxide (MAGNESIUM-ALUMINUM PO) Take by mouth. Magnesium 3 tablets daily.    [provider]  amLODipine (NORVASC) 10 MG tablet Take 10 mg by mouth daily in the afternoon. 11/04/21   [provider]  anastrozole (ARIMIDEX) 1 MG tablet Take 1 mg by mouth daily. 11/01/21   [provider]  aspirin EC 81 MG tablet Take 81 mg by mouth daily. Swallow whole.    [provider]  atorvastatin (LIPITOR) 20 MG tablet Take 20 mg by mouth every morning.     [provider]  baclofen (LIORESAL) 10 MG tablet Take 1 tablet by mouth 3 (three) times daily as needed.    [provider]  buPROPion (WELLBUTRIN XL) 300 MG 24 hr tablet Take 300 mg by mouth every morning.     [provider]  celecoxib (CELEBREX) 200 MG capsule Take 200 mg by mouth daily. 09/12/21   [provider]   EPINEPHrine 0.3 mg/0.3 mL IJ SOAJ injection     [provider]  fluticasone (FLONASE) 50 MCG/ACT nasal spray Place into both nostrils as needed.    [provider]  irbesartan (AVAPRO) 300 MG tablet Take 300 mg by mouth every morning.     [provider]  Magnesium Gluconate 550 MG TABS Take 550 mg by mouth in the morning, at noon, and at bedtime.    [provider]  metoprolol tartrate (LOPRESSOR) 100 MG tablet Take one tablet ( 100 mg ) by mouth 2 hours prior to your Coronary CTA appointment 12/04/21   Evans Lance, MD  omeprazole (PRILOSEC) 40 MG capsule Take 40 mg by mouth every morning.     [provider]  oxybutynin (DITROPAN-XL) 10 MG 24 hr tablet Take 10 mg by mouth every morning.  Patient not taking: Reported on 01/29/2021    [provider]  tamsulosin (FLOMAX) 0.4 MG CAPS capsule Take 0.4 mg by mouth daily. 01/21/21   [provider]  TESTOSTERONE IM Inject 1.5 mLs into the muscle every 14 (fourteen) days.    [provider]  TRINTELLIX 10 MG TABS tablet Take 10 mg by mouth daily. 10/26/20   [provider]  valACYclovir (VALTREX) 500 MG tablet Take 500 mg by mouth as needed. Patient not taking: Reported on 11/08/2021    [provider]  Allergies    Vancomycin, Hydrocodone, Penicillins, and Percocet [oxycodone-acetaminophen]    Review of Systems   Review of Systems  All other systems reviewed and are negative.   Physical Exam Updated Vital Signs BP (!) 150/95   Pulse 86   Temp 97.7 F (36.5 C)   Resp 17   Ht '5\' 7"'$  (1.702 m)   Wt 82.1 kg   SpO2 95%   BMI 28.35 kg/m  Physical Exam Vitals and nursing note reviewed.  Constitutional:      Appearance: He is well-developed.  HENT:     Head: Atraumatic.  Cardiovascular:     Rate and Rhythm: Normal rate.     Comments: 2+ dorsalis pedis Pulmonary:     Effort: Pulmonary effort is normal.  Musculoskeletal:        General:  Tenderness present.     Cervical back: Neck supple.     Comments: There is no profound right lower extremity unilateral swelling, but patient states that it is worse than normal. No skin discoloration, skin is warm to touch and there is tenderness over the calf region with palpation  Skin:    General: Skin is warm.  Neurological:     Mental Status: He is alert and oriented to person, place, and time.     ED Results / Procedures / Treatments   Labs (all labs ordered are listed, but only abnormal results are displayed) Labs Reviewed  BASIC METABOLIC PANEL  CBC WITH DIFFERENTIAL/PLATELET  PROTIME-INR  HEPARIN LEVEL (UNFRACTIONATED)  CBC    EKG None  Radiology CT ABDOMEN PELVIS W CONTRAST  Result Date: 12/04/2021 CLINICAL DATA:  Evaluate for clot in the IVC EXAM: CT ABDOMEN AND PELVIS WITH CONTRAST TECHNIQUE: Multidetector CT imaging of the abdomen and pelvis was performed using the standard protocol following bolus administration of intravenous contrast. RADIATION DOSE REDUCTION: This exam was performed according to the departmental dose-optimization program which includes automated exposure control, adjustment of the mA and/or kV according to patient size and/or use of iterative reconstruction technique. CONTRAST:  59m OMNIPAQUE IOHEXOL 350 MG/ML SOLN COMPARISON:  None Available. FINDINGS: Lower chest: No acute findings Hepatobiliary: No focal hepatic abnormality. Gallbladder unremarkable. Pancreas: No focal abnormality or ductal dilatation. Spleen: No focal abnormality.  Normal size. Adrenals/Urinary Tract: Bilateral renal cysts appear benign. No follow-up imaging recommended. Adrenal glands unremarkable. No renal stones or hydronephrosis. Urinary bladder unremarkable. Stomach/Bowel: Normal appendix. Sigmoid diverticulosis. No active diverticulitis. Stomach and small bowel decompressed, unremarkable. Vascular/Lymphatic: No evidence of aneurysm or adenopathy. Reproductive: No visible focal  abnormality. Penile implant in place. Other: No free fluid or free air. Musculoskeletal: No acute bony abnormality. IMPRESSION: No acute findings in the abdomen or pelvis. Sigmoid diverticulosis. Electronically Signed   By: KRolm BaptiseM.D.   On: 12/04/2021 21:27   CT Angio Chest PE W and/or Wo Contrast  Result Date: 12/04/2021 CLINICAL DATA:  Pulmonary embolism (PE) suspected, high prob EXAM: CT ANGIOGRAPHY CHEST WITH CONTRAST TECHNIQUE: Multidetector CT imaging of the chest was performed using the standard protocol during bolus administration of intravenous contrast. Multiplanar CT image reconstructions and MIPs were obtained to evaluate the vascular anatomy. RADIATION DOSE REDUCTION: This exam was performed according to the departmental dose-optimization program which includes automated exposure control, adjustment of the mA and/or kV according to patient size and/or use of iterative reconstruction technique. CONTRAST:  832mOMNIPAQUE IOHEXOL 350 MG/ML SOLN COMPARISON:  None Available. FINDINGS: Cardiovascular: There are bilateral pulmonary emboli. Saddle embolus noted. Largest clot burden in the  right lower lobe. No evidence of right heart strain. Heart is normal size. Aorta normal caliber. Mediastinum/Nodes: No mediastinal, hilar, or axillary adenopathy. Trachea and esophagus are unremarkable. Thyroid unremarkable. Lungs/Pleura: No confluent opacities or effusions. Upper Abdomen: No acute findings Musculoskeletal: Chest wall soft tissues are unremarkable. No acute bony abnormality. Review of the MIP images confirms the above findings. IMPRESSION: Saddle embolus with bilateral pulmonary emboli, largest burden in the right lower lobe. No evidence of right heart strain. These results were called by telephone at the time of interpretation on 12/04/2021 at 9:23 pm to provider [facility] , who verbally acknowledged these results. Electronically Signed   By: Rolm Baptise M.D.   On: 12/04/2021 21:23   US  Venous Img Lower Unilateral Right  Result Date: 12/04/2021 CLINICAL DATA:  Right lower extremity swelling EXAM: RIGHT LOWER EXTREMITY VENOUS DOPPLER ULTRASOUND TECHNIQUE: Gray-scale sonography with compression, as well as color and duplex ultrasound, were performed to evaluate the deep venous system(s) from the level of the common femoral vein through the popliteal and proximal calf veins. COMPARISON:  None Available. FINDINGS: VENOUS There is occlusive expansile thrombus within the right common femoral vein. The profundus femoral vein centrally demonstrates appropriate compressibility but no significant antegrade flow likely related to distal obstruction involving the common femoral vein. There is occlusive thrombus within the femoral vein and popliteal vein. The greater saphenous vein at the saphenofemoral junction is patent. The visualized peroneal veins and at least 1 of the paired posterior tibial veins appear patent Limited views of the contralateral common femoral vein are unremarkable. OTHER None. Limitations: none IMPRESSION: Occlusive DVT involving the right common femoral vein, femoral vein and popliteal vein. If indicated, CT or MR venography of the abdomen pelvis may be helpful to assess the central extent of thrombus. Electronically Signed   By: Fidela Salisbury M.D.   On: 12/04/2021 19:52    Procedures .Critical Care  Performed by: Varney Biles, MD Authorized by: Varney Biles, MD   Critical care provider statement:    Critical care time (minutes):  51   Critical care was necessary to treat or prevent imminent or life-threatening deterioration of the following conditions:  Circulatory failure   Critical care was time spent personally by me on the following activities:  Development of treatment plan with patient or surrogate, discussions with consultants, evaluation of patient's response to treatment, examination of patient, ordering and review of laboratory studies, ordering and review  of radiographic studies, ordering and performing treatments and interventions, pulse oximetry, re-evaluation of patient's condition and review of old charts     Medications Ordered in ED Medications  heparin bolus via infusion 5,500 Units (has no administration in time range)  heparin ADULT infusion 100 units/mL (25000 units/265m) (has no administration in time range)  iohexol (OMNIPAQUE) 350 MG/ML injection 80 mL (80 mLs Intravenous Contrast Given 12/04/21 2109)    ED Course/ Medical Decision Making/ A&P                           Medical Decision Making Amount and/or Complexity of Data Reviewed Labs: ordered. Radiology: ordered.  Risk Prescription drug management. Decision regarding hospitalization.  This patient presents to the ED with chief complaint(s) of leg swelling, and upon further questioning it also appears that he has been having shortness of breath over the last 3 to 4 weeks with pertinent past medical history of hypertension, hyperlipidemia.The complaint involves an extensive differential diagnosis and also carries with  it a high risk of complications and morbidity.    The differential diagnosis includes acute DVT, PE, CHF, severe anemia, valvular disorder.  Patient initially had basic labs and ultrasound ordered.  Ultrasound shows acute DVT, including in the common femoral vein.  Occlusive clot noted.  CT angio PE ordered after the diagnosis of DVT, and it confirms saddle pulmonary embolus.  Patient does not have right-sided strain.  Case discussed with vascular surgery. They recommend that we get CT abdomen and pelvis added to the CT angio chest.  If patient is admitted, Dr. Trula Slade will round on the patient.  Additional history obtained: Additional history obtained from spouse Records reviewed  cardiology notes, medication review  Independent labs interpretation:  The following labs were independently interpreted: CBC and BMP are reassuring.  Independent  visualization and interpretation of imaging: - I independently visualized the following imaging with scope of interpretation limited to determining acute life threatening conditions related to emergency care: CT angio chest, which revealed saddle pulmonary embolism.  Treatment and Reassessment: Patient will be started on heparin for his PE. Results discussed with the patient.  Although patient has saddle pulmonary embolus, there is no evidence of right-sided heart strain.  Patient has no profound tachycardia, hypoxia, respiratory distress.  I do not think cardiac biomarkers are needed at this time.  We will not activate code PE.  Consultation: - Consulted or discussed management/test interpretation with external professional: Vascular surgery team, they will see the patient.    Final Clinical Impression(s) / ED Diagnoses Final diagnoses:  Acute deep vein thrombosis (DVT) of femoral vein of right lower extremity ([place])  Acute saddle pulmonary embolism without acute cor pulmonale ([place])    Rx / DC Orders ED Discharge Orders     None         Varney Biles, MD 12/04/21 2214

## 2021-12-04 NOTE — Patient Instructions (Addendum)
Medication Instructions:  Your physician recommends that you continue on your current medications as directed. Please refer to the Current Medication list given to you today.  *If you need a refill on your cardiac medications before your next appointment, please call your pharmacy*  Lab Work: None ordered.  If you have labs (blood work) drawn today and your tests are completely normal, you will receive your results only by: [place] (if you have MyChart) OR A paper copy in the mail If you have any lab test that is abnormal or we need to change your treatment, we will call you to review the results.  Testing/Procedures: None ordered.  Follow-Up: At [facility], you and your health needs are our priority.  As part of our continuing mission to provide you with exceptional heart care, we have created designated Provider Care Teams.  These Care Teams include your primary Cardiologist (physician) and Advanced Practice Providers (APPs -  Physician Assistants and Nurse Practitioners) who all work together to provide you with the care you need, when you need it.  We recommend signing up for the patient portal called "MyChart".  Sign up information is provided on this After Visit Summary.  MyChart is used to connect with patients for Virtual Visits (Telemedicine).  Patients are able to view lab/test results, encounter notes, upcoming appointments, etc.  Non-urgent messages can be sent to your provider as well.   To learn more about what you can do with MyChart, go to [email or web address].    Your next appointment:   Follow up with Dr. Cristopher Peru will be based on your Coronary CTA Results.   The format for your next appointment:   In Person  Provider:   Cristopher Peru, MD.           Your cardiac CT will be scheduled at one of the below locations:   [facility] [address] [place], [place] [address] [phone]    Please arrive at the [facility] and  Children's Entrance (Entrance C2) of [facility] 30 minutes prior to test start time. You can use the FREE valet parking offered at entrance C (encouraged to control the heart rate for the test)  Proceed to the [facility] Radiology Department (first floor) to check-in and test prep.  All radiology patients and guests should use entrance C2 at [facility], accessed from [facility], even though the hospital's physical address listed is [address].      Please follow these instructions carefully (unless otherwise directed):  Hold all erectile dysfunction medications at least 3 days (72 hrs) prior to test. (Ie viagra, cialis, sildenafil, tadalafil, etc) We will administer nitroglycerin during this exam.   On the Night Before the Test: Be sure to Drink plenty of water. Do not consume any caffeinated/decaffeinated beverages or chocolate 12 hours prior to your test. Do not take any antihistamines 12 hours prior to your test.  On the Day of the Test: Drink plenty of water until 1 hour prior to the test. You may take your regular medications prior to the test.  Take metoprolol (Lopressor) two hours prior to test.      After the Test: Drink plenty of water. After receiving IV contrast, you may experience a mild flushed feeling. This is normal. On occasion, you may experience a mild rash up to 24 hours after the test. This is not dangerous. If this occurs, you can take Benadryl 25 mg and increase your fluid  intake. If you experience trouble breathing, this can be serious. If it is severe call 911 IMMEDIATELY. If it is mild, please call our office.   We will call to schedule your test 2-4 weeks out understanding that some insurance companies will need an authorization prior to the service being performed.   For non-scheduling related questions, please contact the cardiac imaging nurse navigator should you have any questions/concerns: Marchia Bond,  Cardiac Imaging Nurse Navigator Gordy Clement, Cardiac Imaging Nurse Navigator [facility] Direct Office Dial: [phone]   For scheduling needs, including cancellations and rescheduling, please call [place], [phone].

## 2021-12-05 ENCOUNTER — Other Ambulatory Visit (HOSPITAL_COMMUNITY): Payer: Self-pay

## 2021-12-05 ENCOUNTER — Telehealth (HOSPITAL_COMMUNITY): Payer: Self-pay | Admitting: Pharmacy Technician

## 2021-12-05 ENCOUNTER — Inpatient Hospital Stay (HOSPITAL_COMMUNITY): Payer: Medicare Other

## 2021-12-05 ENCOUNTER — Encounter (HOSPITAL_COMMUNITY): Payer: Self-pay | Admitting: Internal Medicine

## 2021-12-05 DIAGNOSIS — I82431 Acute embolism and thrombosis of right popliteal vein: Secondary | ICD-10-CM | POA: Diagnosis present

## 2021-12-05 DIAGNOSIS — I82401 Acute embolism and thrombosis of unspecified deep veins of right lower extremity: Secondary | ICD-10-CM | POA: Diagnosis not present

## 2021-12-05 DIAGNOSIS — Z79899 Other long term (current) drug therapy: Secondary | ICD-10-CM | POA: Diagnosis not present

## 2021-12-05 DIAGNOSIS — F32A Depression, unspecified: Secondary | ICD-10-CM | POA: Diagnosis present

## 2021-12-05 DIAGNOSIS — E785 Hyperlipidemia, unspecified: Secondary | ICD-10-CM

## 2021-12-05 DIAGNOSIS — I2692 Saddle embolus of pulmonary artery without acute cor pulmonale: Secondary | ICD-10-CM

## 2021-12-05 DIAGNOSIS — I2609 Other pulmonary embolism with acute cor pulmonale: Secondary | ICD-10-CM | POA: Diagnosis not present

## 2021-12-05 DIAGNOSIS — I10 Essential (primary) hypertension: Secondary | ICD-10-CM | POA: Diagnosis present

## 2021-12-05 DIAGNOSIS — E291 Testicular hypofunction: Secondary | ICD-10-CM

## 2021-12-05 DIAGNOSIS — Z96611 Presence of right artificial shoulder joint: Secondary | ICD-10-CM | POA: Diagnosis present

## 2021-12-05 DIAGNOSIS — Z7982 Long term (current) use of aspirin: Secondary | ICD-10-CM | POA: Diagnosis not present

## 2021-12-05 DIAGNOSIS — I82409 Acute embolism and thrombosis of unspecified deep veins of unspecified lower extremity: Secondary | ICD-10-CM | POA: Diagnosis present

## 2021-12-05 DIAGNOSIS — K219 Gastro-esophageal reflux disease without esophagitis: Secondary | ICD-10-CM | POA: Diagnosis present

## 2021-12-05 DIAGNOSIS — R002 Palpitations: Secondary | ICD-10-CM | POA: Diagnosis not present

## 2021-12-05 DIAGNOSIS — F39 Unspecified mood [affective] disorder: Secondary | ICD-10-CM

## 2021-12-05 DIAGNOSIS — Z79811 Long term (current) use of aromatase inhibitors: Secondary | ICD-10-CM | POA: Diagnosis not present

## 2021-12-05 DIAGNOSIS — I82411 Acute embolism and thrombosis of right femoral vein: Secondary | ICD-10-CM | POA: Diagnosis present

## 2021-12-05 LAB — ECHOCARDIOGRAM LIMITED
Height: 67 in
Weight: 2793.67 oz

## 2021-12-05 LAB — CBC
HCT: 44.2 % (ref 39.0–52.0)
HCT: 40.8 % (ref 39.0–52.0)
Hemoglobin: 15.3 g/dL (ref 13.0–17.0)
Hemoglobin: 14.2 g/dL (ref 13.0–17.0)
MCH: 32.7 pg (ref 26.0–34.0)
MCH: 33.1 pg (ref 26.0–34.0)
MCHC: 34.6 g/dL (ref 30.0–36.0)
MCHC: 34.8 g/dL (ref 30.0–36.0)
MCV: 94.4 fL (ref 80.0–100.0)
MCV: 95.1 fL (ref 80.0–100.0)
Platelets: 151 10*3/uL (ref 150–400)
Platelets: 162 10*3/uL (ref 150–400)
RBC: 4.68 MIL/uL (ref 4.22–5.81)
RBC: 4.29 MIL/uL (ref 4.22–5.81)
RDW: 13.6 % (ref 11.5–15.5)
RDW: 13.7 % (ref 11.5–15.5)
WBC: 5.7 10*3/uL (ref 4.0–10.5)
WBC: 6.8 10*3/uL (ref 4.0–10.5)
nRBC: 0 % (ref 0.0–0.2)
nRBC: 0 % (ref 0.0–0.2)

## 2021-12-05 LAB — HEPARIN LEVEL (UNFRACTIONATED)
Heparin Unfractionated: 0.97 IU/mL — ABNORMAL HIGH (ref 0.30–0.70)
Heparin Unfractionated: 0.28 IU/mL — ABNORMAL LOW (ref 0.30–0.70)
Heparin Unfractionated: 0.34 IU/mL (ref 0.30–0.70)

## 2021-12-05 MED ORDER — ONDANSETRON HCL 4 MG/2ML IJ SOLN: 4.0000 mg | Freq: Four times a day (QID) | INTRAMUSCULAR | Status: DC | PRN

## 2021-12-05 MED ORDER — DOCUSATE SODIUM 100 MG PO CAPS
100.0000 mg | ORAL_CAPSULE | Freq: Two times a day (BID) | ORAL | Status: DC
  Filled 2021-12-05: qty 1

## 2021-12-05 MED ORDER — BACLOFEN 10 MG PO TABS: 10.0000 mg | ORAL_TABLET | Freq: Three times a day (TID) | ORAL | Status: DC | PRN

## 2021-12-05 MED ORDER — ACETAMINOPHEN 325 MG PO TABS: 650.0000 mg | ORAL_TABLET | Freq: Four times a day (QID) | ORAL | Status: DC | PRN

## 2021-12-05 MED ORDER — VORTIOXETINE HBR 5 MG PO TABS
10.0000 mg | ORAL_TABLET | Freq: Every day | ORAL | Status: DC
  Administered 2021-12-05 – 2021-12-06 (×2): 10 mg via ORAL
  Filled 2021-12-05 (×2): qty 2

## 2021-12-05 MED ORDER — ATORVASTATIN CALCIUM 10 MG PO TABS
20.0000 mg | ORAL_TABLET | Freq: Every morning | ORAL | Status: DC
  Administered 2021-12-05 – 2021-12-06 (×2): 20 mg via ORAL
  Filled 2021-12-05 (×2): qty 2

## 2021-12-05 MED ORDER — ACETAMINOPHEN 650 MG RE SUPP: 650.0000 mg | Freq: Four times a day (QID) | RECTAL | Status: DC | PRN

## 2021-12-05 MED ORDER — AMLODIPINE BESYLATE 10 MG PO TABS
10.0000 mg | ORAL_TABLET | Freq: Every day | ORAL | Status: DC
  Administered 2021-12-05: 10 mg via ORAL
  Filled 2021-12-05: qty 1

## 2021-12-05 MED ORDER — BUPROPION HCL ER (XL) 150 MG PO TB24
300.0000 mg | ORAL_TABLET | Freq: Every morning | ORAL | Status: DC
  Administered 2021-12-05 – 2021-12-06 (×2): 300 mg via ORAL
  Filled 2021-12-05 (×2): qty 2

## 2021-12-05 MED ORDER — HYDRALAZINE HCL 20 MG/ML IJ SOLN: 5.0000 mg | INTRAMUSCULAR | Status: DC | PRN

## 2021-12-05 MED ORDER — IRBESARTAN 300 MG PO TABS
300.0000 mg | ORAL_TABLET | Freq: Every morning | ORAL | Status: DC
  Administered 2021-12-06: 300 mg via ORAL
  Filled 2021-12-05: qty 1

## 2021-12-05 MED ORDER — PANTOPRAZOLE SODIUM 40 MG PO TBEC
40.0000 mg | DELAYED_RELEASE_TABLET | Freq: Every day | ORAL | Status: DC
  Administered 2021-12-05 – 2021-12-06 (×2): 40 mg via ORAL
  Filled 2021-12-05 (×3): qty 1

## 2021-12-05 MED ORDER — POLYETHYLENE GLYCOL 3350 17 G PO PACK: 17.0000 g | PACK | Freq: Every day | ORAL | Status: DC | PRN

## 2021-12-05 MED ORDER — TAMSULOSIN HCL 0.4 MG PO CAPS
0.4000 mg | ORAL_CAPSULE | Freq: Every day | ORAL | Status: DC
  Administered 2021-12-05 – 2021-12-06 (×2): 0.4 mg via ORAL
  Filled 2021-12-05 (×2): qty 1

## 2021-12-05 MED ORDER — BUPROPION HCL ER (XL) 150 MG PO TB24: 150.0000 mg | ORAL_TABLET | Freq: Every morning | ORAL | Status: DC

## 2021-12-05 MED ORDER — SODIUM CHLORIDE 0.9% FLUSH
3.0000 mL | Freq: Two times a day (BID) | INTRAVENOUS | Status: DC
  Administered 2021-12-05 (×2): 3 mL via INTRAVENOUS

## 2021-12-05 MED ORDER — ONDANSETRON HCL 4 MG PO TABS: 4.0000 mg | ORAL_TABLET | Freq: Four times a day (QID) | ORAL | Status: DC | PRN

## 2021-12-05 MED ORDER — MELATONIN 5 MG PO TABS
10.0000 mg | ORAL_TABLET | Freq: Every evening | ORAL | Status: DC | PRN
  Administered 2021-12-05: 10 mg via ORAL
  Filled 2021-12-05: qty 2

## 2021-12-05 MED ORDER — BISACODYL 5 MG PO TBEC: 5.0000 mg | DELAYED_RELEASE_TABLET | Freq: Every day | ORAL | Status: DC | PRN

## 2021-12-05 MED ORDER — MORPHINE SULFATE (PF) 2 MG/ML IV SOLN: 2.0000 mg | INTRAVENOUS | Status: DC | PRN

## 2021-12-05 NOTE — Consult Note (Addendum)
Hospital Consult    Reason for Consult:  DVT/PE Requesting Physician:  Jacob [name] MRN #:  [id number]  History of Present Illness: This is a 71 y.o. male who presented to the hospital with yesterday with RLE swelling and shortness of breath.  DVT study revealed occlusive DVT in the right CFV, femoral vein and popliteal vein.  He was also found to have a saddle embolus.  He was placed on heparin gtt and transferred to [facility] for further evaluation.    Pt states that he had elbow surgery a couple of weeks ago.  He states that he has had some palpitations and he has seen cardiology.  About 3 weeks ago he was started on Eliquis.  He did wear a monitor after that and he was taken off the Eliquis.  He states he saw Dr. Crissie Sickles yesterday and about 4 hours later he started having swelling in the right leg.  He states he called there office and they advised him to go to the [facility].  He states his leg is better today.  He does not have any hx of blood clots in the past.    There is no family hx of AAA.   There is no family hx of DVT.  He has hx of DDD and multiple orthopedic surgeries in the past.   Pt grew up at [facility].     The pt is on a statin for cholesterol management.  The pt is on a daily aspirin.   Other AC:  heparin gtt The pt is on CCB, BB, ARB for hypertension.   The pt is not diabetic.   Tobacco hx:  never  Past Medical History:  Diagnosis Date   Depression    DJD (degenerative joint disease)    ED (erectile dysfunction) of organic origin    GERD (gastroesophageal reflux disease)    Hemorrhoids    History of adenomatous polyp of colon    12-11-2014  tubular adenoam, hyperplastic   Hyperlipidemia    Hypertension     Past Surgical History:  Procedure Laterality Date   ACHILLES TENDON REPAIR Right 11/2009   ruptured   ANTERIOR CERVICAL DECOMP/DISCECTOMY FUSION  04/23/2009   C7 -- T1   COLONOSCOPY WITH PROPOFOL N/A 12/11/2014   Procedure: COLONOSCOPY WITH PROPOFOL;   Surgeon: Garlan Fair, MD;  Location: [facility];  Service: Endoscopy;  Laterality: N/A;   FINGER ARTHRODESIS Right 02/06/2017   Procedure: ARTHRODESIS RIGHT INDEX FINGER DISTAL INTERPHALANGEAL JOINT;  Surgeon: Leanora Cover, MD;  Location: [place];  Service: Orthopedics;  Laterality: Right;   INGUINAL HERNIA REPAIR Bilateral left 03/18/2002/  right -- yrs ago   [place] N/A 04/03/2016   Procedure: PENILE PROTHESIS INFLATABLE;  Surgeon: Franchot Gallo, MD;  Location: [facility];  Service: Urology;  Laterality: N/A;   SHOULDER ARTHROSCOPY W/ ROTATOR CUFF REPAIR Right 2002 and 2012   SHOULDER ARTHROSCOPY WITH ROTATOR CUFF REPAIR AND SUBACROMIAL DECOMPRESSION Right 05/06/2012   Procedure: SHOULDER ARTHROSCOPY WITH ROTATOR CUFF REPAIR AND SUBACROMIAL DECOMPRESSION;  Surgeon: Ninetta Lights, MD;  Location: [place];  Service: Orthopedics;  Laterality: Right;  RIGHT SHOULDER ARTHROSCOPY  lysis of adhesions rotator cuff repair    SHOULDER ARTHROSCOPY/  DEBRIDEMENT LABRUM AND ROTATOR CUFF PARTIAL TEAR/  RESECTION BURSA/  ACROMIOPLASTY/  CORACOACROMINAL LIGAMENT RELEASE/  DCR Left 12/17/2001   SOFT TISSUE MASS EXCISION  06/25/2006   right forearm-- angiolipoma   TONSILLECTOMY AND ADENOIDECTOMY  child  TOTAL SHOULDER ARTHROPLASTY Right 02/04/2016    Allergies  Allergen Reactions   Vancomycin Other (See Comments)    Hypotension    Hydrocodone Other (See Comments)    "jumps out of skin"   Penicillins Other (See Comments)    Unknown childhood reaction  Has patient had aCN reaction causing immediate rash, facial/tongue/throat swelling, SOB or lightheadedness with hypotension: No Has patient had a PCN reaction causing severe rash involving mucus membranes or skin necrosis: No Has patient had a PCN reaction that required hospitalization No Has patient had a PCN reaction occurring within the last 10 years: No If all of the above  answers are "NO", then may proceed with Cephalosporin use.   Percocet [Oxycodone-Acetaminophen] Other (See Comments)    "jumps out of skin"    Prior to Admission medications   Medication Sig Start Date End Date Taking? Authorizing Provider  apixaban (ELIQUIS) 5 MG TABS tablet Take 5 mg by mouth 2 (two) times daily.   Yes [provider]  baclofen (LIORESAL) 10 MG tablet Take 1 tablet by mouth 3 (three) times daily as needed.   Yes [provider]  EPINEPHrine 0.3 mg/0.3 mL IJ SOAJ injection    Yes [provider]  MAGNESIUM MALATE PO Take 1,350 mg by mouth in the morning, at noon, and at bedtime.   Yes [provider]  Omega-3 Fatty Acids (FISH OIL PO) Take 2 tablets by mouth daily.   Yes [provider]  valACYclovir (VALTREX) 500 MG tablet Take 500 mg by mouth as needed (for flares).   Yes [provider]  amLODipine (NORVASC) 10 MG tablet Take 10 mg by mouth daily in the afternoon. 11/04/21   [provider]  anastrozole (ARIMIDEX) 1 MG tablet Take 1 mg by mouth daily. 11/01/21   [provider]  aspirin EC 81 MG tablet Take 81 mg by mouth daily. Swallow whole.    [provider]  atorvastatin (LIPITOR) 20 MG tablet Take 20 mg by mouth every morning.     [provider]  buPROPion (WELLBUTRIN XL) 150 MG 24 hr tablet Take 150 mg by mouth every morning. Patient not taking: Reported on 12/05/2021 11/26/21   [provider]  buPROPion (WELLBUTRIN XL) 300 MG 24 hr tablet Take 300 mg by mouth every morning.     [provider]  celecoxib (CELEBREX) 200 MG capsule Take 200 mg by mouth daily. 09/12/21   [provider]  fluorouracil (EFUDEX) 5 % cream Apply topically. 11/21/21   [provider]  HYDROcodone-acetaminophen (NORCO/VICODIN) 5-325 MG tablet Take 1 tablet by mouth every 6 (six) hours as needed. Patient not taking: Reported on 12/05/2021 11/20/21   [provider]   irbesartan (AVAPRO) 300 MG tablet Take 300 mg by mouth every morning.     [provider]  metoprolol tartrate (LOPRESSOR) 100 MG tablet Take one tablet ( 100 mg ) by mouth 2 hours prior to your Coronary CTA appointment Patient not taking: Reported on 12/05/2021 12/04/21   Evans Lance, MD  omeprazole (PRILOSEC) 40 MG capsule Take 40 mg by mouth every morning.     [provider]  tamsulosin (FLOMAX) 0.4 MG CAPS capsule Take 0.4 mg by mouth daily. 01/21/21   [provider]  TESTOSTERONE IM Inject 1.5 mLs into the muscle every 14 (fourteen) days.    [provider]  TRINTELLIX 10 MG TABS tablet Take 10 mg by mouth daily. 10/26/20   [provider]  Social History   Socioeconomic History   Marital status: Married    Spouse name: Not on file   Number of children: Not on file   Years of education: Not on file   Highest education level: Not on file  Occupational History   Not on file  Tobacco Use   Smoking status: Never   Smokeless tobacco: Never  Vaping Use   Vaping Use: Never used  Substance and Sexual Activity   Alcohol use: Yes    Comment: daily wine, 2-3 per day, no h/o w/d   Drug use: No   Sexual activity: Not on file  Other Topics Concern   Not on file  Social History Narrative   Not on file   Social Determinants of Health   Financial Resource Strain: Not on file  Food Insecurity: Not on file  Transportation Needs: Not on file  Physical Activity: Not on file  Stress: Not on file  Social Connections: Not on file  Intimate Partner Violence: Not on file    Family History  Problem Relation Age of Onset   Parkinsonism Father    Colon cancer Neg Hx    Esophageal cancer Neg Hx    Rectal cancer Neg Hx    Stomach cancer Neg Hx     ROS: '[x]'$  Positive   '[ ]'$  Negative   '[ ]'$  All sytems reviewed and are negative  Cardiac: '[x]'$  HTN '[x]'$  HLD  Vascular: '[]'$  pain in legs while walking '[]'$  pain in legs at rest '[]'$  pain in legs  at night '[]'$  non-healing ulcers '[x]'$  hx of DVT '[x]'$  swelling in legs  Pulmonary: '[]'$  asthma/wheezing '[]'$  home O2  Neurologic: '[]'$  hx of CVA '[]'$  mini stroke   Hematologic: '[]'$  hx of cancer  Endocrine:   '[]'$  diabetes '[]'$  thyroid disease  GI '[x]'$  GERD  GU: '[]'$  CKD/renal failure '[]'$  HD--'[]'$  M/W/F or '[]'$  T/T/S  Psychiatric: '[]'$  anxiety '[x]'$  depression  Musculoskeletal: '[x]'$  DDD  Integumentary: '[]'$  rashes '[]'$  ulcers  Constitutional: '[]'$  fever  '[]'$  chills  Physical Examination  Vitals:   12/05/21 0930 12/05/21 1107  BP: (!) 142/85 (!) 141/92  Pulse:  81  Resp: 16 16  Temp:  98.6 F (37 C)  SpO2:  94%   Body mass index is 27.35 kg/m.  General:  WDWN in NAD Gait: Not observed HENT: WNL, normocephalic Pulmonary: normal non-labored breathing Cardiac: regular, without carotid bruits Abdomen:  soft, NT; aortic pulse is not palpable Skin: without rashes Vascular Exam/Pulses:  Right Left  Radial 2+ (normal) 2+ (normal)  DP 2+ (normal) 2+ (normal)   Extremities: minimal swelling right leg.  The right leg is not tight and is not tender to palpation.   Musculoskeletal: no muscle wasting or atrophy  Neurologic: A&O X 3 Psychiatric:  The pt has Normal affect.   CBC    Component Value Date/Time   WBC 5.7 12/05/2021 0604   RBC 4.68 12/05/2021 0604   HGB 15.3 12/05/2021 0604   HGB 18.1 (H) 11/24/2018 1456   HCT 44.2 12/05/2021 0604   PLT 151 12/05/2021 0604   MCV 94.4 12/05/2021 0604   MCH 32.7 12/05/2021 0604   MCHC 34.6 12/05/2021 0604   RDW 13.6 12/05/2021 0604   LYMPHSABS 1.0 12/04/2021 2013   MONOABS 0.8 12/04/2021 2013   EOSABS 0.0 12/04/2021 2013   BASOSABS 0.0 12/04/2021 2013    BMET    Component Value Date/Time   NA 139 12/04/2021 2013   K 4.3 12/04/2021 2013  CL 100 12/04/2021 2013   CO2 26 12/04/2021 2013   GLUCOSE 84 12/04/2021 2013   BUN 20 12/04/2021 2013   CREATININE 1.03 12/04/2021 2013   CALCIUM 9.5 12/04/2021 2013   GFRNONAA >60 12/04/2021  2013   GFRAA >60 04/04/2016 0528    COAGS: Lab Results  Component Value Date   INR 1.1 12/04/2021     Non-Invasive Vascular Imaging:   DVT study 12/04/2021: IMPRESSION: Occlusive DVT involving the right common femoral vein, femoral vein and popliteal vein. If indicated, CT or MR venography of the abdomen pelvis may be helpful to assess the central extent of thrombus.  CT a/p 12/04/2021: IMPRESSION: No acute findings in the abdomen or pelvis.  Sigmoid diverticulosis  CTA chest PE 12/04/2021: IMPRESSION: Saddle embolus with bilateral pulmonary emboli, largest burden in the right lower lobe. No evidence of right heart strain.  2D echo 12/05/2021  1. Left ventricular ejection fraction, by estimation, is 60 to 65%. The  left ventricle has normal function. The left ventricle has no regional wall motion abnormalities.   2. Right ventricular systolic function is normal. The right ventricular size is normal.    ASSESSMENT/PLAN: This is a 71 y.o. male with RLE DVT with hx of left elbow surgery a couple of weeks ago.     -pt right leg without significant swelling.  Pt was on Eliquis a few weeks ago and most likely can go back on this.  Given his right leg has improved from yesterday, he most likely will not require any intervention.   -I discussed with the pt the proper way to elevate his legs and recommended compression, which I will order.   -Dr. Stanford Breed to evaluate pt and determine further plan   Leontine Locket, PA-C [facility] [phone]  VASCULAR STAFF ADDENDUM: I have independently interviewed and examined the patient. I agree with the above.  71yM with provoked right lower extremity femoropopliteal deep venous thrombosis causing almost no symptoms. The patient also developed bilateral pulmonary embolism without CT or echocardiogram evidence of right heart strain. He has dyspnea with exertion, but that not at rest.  He reports a fairly recent orthopedic  surgery. He has no personal or family history of clotting. Easily palpable DP pulses with trace edema on the right.   Recommend medical therapy only for asymptomatic DVT/PE. He should receive at least 6 months of anticoagulation. He can be transitioned to a DOAC from my perspective. He can follow up with me on an as-needed basis. Please call for any questions.  Yevonne Aline. Stanford Breed, MD [facility] Phone Number: [phone] 12/05/2021 3:54 PM

## 2021-12-05 NOTE — Progress Notes (Signed)
ANTICOAGULATION CONSULT NOTE - Initial Consult  Pharmacy Consult for Heparin infusion Indication: pulmonary embolus and DVT  Allergies  Allergen Reactions   Vancomycin Other (See Comments)    Hypotension    Hydrocodone Other (See Comments)    "jumps out of skin"   Penicillins Other (See Comments)    Unknown childhood reaction  Has patient had aCN reaction causing immediate rash, facial/tongue/throat swelling, SOB or lightheadedness with hypotension: No Has patient had a PCN reaction causing severe rash involving mucus membranes or skin necrosis: No Has patient had a PCN reaction that required hospitalization No Has patient had a PCN reaction occurring within the last 10 years: No If all of the above answers are "NO", then may proceed with Cephalosporin use.   Percocet [Oxycodone-Acetaminophen] Other (See Comments)    "jumps out of skin"    Patient Measurements: Height: '5\' 7"'$  (170.2 cm) Weight: 82.1 kg (181 lb) IBW/kg (Calculated) : 66.1 Heparin Dosing Weight: 82.1  Vital Signs: Temp: 98.5 F (36.9 C) (09/28 0500) BP: 152/104 (09/28 0600) Pulse Rate: 72 (09/28 0610)  Labs: Recent Labs    12/04/21 2013 12/05/21 0604  HGB 16.7 15.3  HCT 47.6 44.2  PLT 151 151  LABPROT 13.9  --   INR 1.1  --   HEPARINUNFRC  --  0.97*  CREATININE 1.03  --      Estimated Creatinine Clearance: 69.4 mL/min (by C-G formula based on SCr of 1.03 mg/dL).   Medical History: Past Medical History:  Diagnosis Date   Depression    DJD (degenerative joint disease)    ED (erectile dysfunction) of organic origin    GERD (gastroesophageal reflux disease)    Hemorrhoids    History of adenomatous polyp of colon    12-11-2014  tubular adenoam, hyperplastic   Hyperlipidemia    Hypertension     Medications:  (Not in a hospital admission)  Assessment: 71 yo M presents with swelling and pain in RLE. Found to have RLE DVT and saddle PE. Patient was not taking any anticoagulation prior to  admission. Pharmacy consulted to dose and manage heparin infusion for treatment of VTE.   Hgb 15.3, Plt 151, heparin level 0.97 (supra-therapeutic). Spoke with nurse and no issues with the line running or signs/symptoms of bleeding this morning.  Goal of Therapy:  Heparin level 0.3-0.7 units/ml Monitor platelets by anticoagulation protocol: Yes   Plan:  Reduce heparin infusion at 1200 units/hr (reduce by 2 unit/kg/hr) Check heparin level in 6 hours Monitor daily CBC, heparin level, and for s/sx of bleeding  Sandford Craze, PharmD. [facility] Acute Care PGY-1  12/05/2021 7:52 AM    Please refer to [facility] for pharmacy phone number

## 2021-12-05 NOTE — H&P (Signed)
History and Physical    Patient: Jacob [name] SWN:[id number] DOB: 1951/01/05 DOA: 12/04/2021 DOS: the patient was seen and examined on 12/05/2021 PCP: Ginger Organ., MD  Patient coming from: Home - lives with wife; [facility]: Wife, [phone]   Chief Complaint: RLE pain/edema  HPI: Jacob [name] is a 71 y.o. male with medical history significant of HTN and HLD presenting with  RLE edema.  He had been seen on 9/1 by cardiology - [employer] had previously reported an episode of afib and he was started on Eliquis but this was stopped by cardiology after 4-5 days after strip review showed NSR with frequent PACs and he was started on a Zio patch.  9/15 echo showed preserved EF and grade 1 diastolic dysfunction.  He called cardiology on 9/22 with c/o intermittent light-headedness and SOB.  He was seen again by cardiology yesterday and was planned for a coronary CTA due to SOB/DOE.  He had mild R ankle edema at the time of the appointment but it worsened significantly during the day and so he went to the ER.  He noticed the prior day that his ankle was swelling  a bit, very unsual.  He didn't think about mentioning it at the appointment.  His leg started feeling more tight through the afternoon.  His leg was very swollen.  He messaged the cardiology office and they told him to go to DB.  His symptoms for 8 weeks - accelerated HR that never drops but last Friday it wouldn't go above 60.  Yesterday was the first time he had leg symptoms.  SOB has been x 3 weeks, has to sit when he gets up stairs.  No prior h/o clots.  He had elbow surgery 2 weeks ago yesterday at [facility].  He started noticing some atrophy in his left hand about a year ago.  He had MRIs, myelogram, and nerve conduction study.  Decided to release the ulnar nerve to see if that would improve the atrophy.  He has not been less mobile than usual.  Walked 2-3 miles a couple of times a week.  No Fh of clotting disorders.  He is up to  date on cancer screenings and no h/o malignancy.    ER Course:  [facility], per Dr. Alcario Drought:  DVT + Saddle PE, going to progressive.  Vasc surg (Dr. Trula Slade) said they would round on him for the DVT in leg.     Review of Systems: As mentioned in the history of present illness. All other systems reviewed and are negative. Past Medical History:  Diagnosis Date   Depression    DJD (degenerative joint disease)    ED (erectile dysfunction) of organic origin    GERD (gastroesophageal reflux disease)    Hemorrhoids    History of adenomatous polyp of colon    12-11-2014  tubular adenoam, hyperplastic   Hyperlipidemia    Hypertension    Past Surgical History:  Procedure Laterality Date   ACHILLES TENDON REPAIR Right 11/2009   ruptured   ANTERIOR CERVICAL DECOMP/DISCECTOMY FUSION  04/23/2009   C7 -- T1   COLONOSCOPY WITH PROPOFOL N/A 12/11/2014   Procedure: COLONOSCOPY WITH PROPOFOL;  Surgeon: Garlan Fair, MD;  Location: [facility];  Service: Endoscopy;  Laterality: N/A;   FINGER ARTHRODESIS Right 02/06/2017   Procedure: ARTHRODESIS RIGHT INDEX FINGER DISTAL INTERPHALANGEAL JOINT;  Surgeon: Leanora Cover, MD;  Location: [place];  Service: Orthopedics;  Laterality: Right;  INGUINAL HERNIA REPAIR Bilateral left 03/18/2002/  right -- yrs ago   [place] N/A 04/03/2016   Procedure: PENILE PROTHESIS INFLATABLE;  Surgeon: Franchot Gallo, MD;  Location: [facility];  Service: Urology;  Laterality: N/A;   SHOULDER ARTHROSCOPY W/ ROTATOR CUFF REPAIR Right 2002 and 2012   SHOULDER ARTHROSCOPY WITH ROTATOR CUFF REPAIR AND SUBACROMIAL DECOMPRESSION Right 05/06/2012   Procedure: SHOULDER ARTHROSCOPY WITH ROTATOR CUFF REPAIR AND SUBACROMIAL DECOMPRESSION;  Surgeon: Ninetta Lights, MD;  Location: [place];  Service: Orthopedics;  Laterality: Right;  RIGHT SHOULDER ARTHROSCOPY  lysis of adhesions rotator cuff repair     SHOULDER ARTHROSCOPY/  DEBRIDEMENT LABRUM AND ROTATOR CUFF PARTIAL TEAR/  RESECTION BURSA/  ACROMIOPLASTY/  CORACOACROMINAL LIGAMENT RELEASE/  DCR Left 12/17/2001   SOFT TISSUE MASS EXCISION  06/25/2006   right forearm-- angiolipoma   TONSILLECTOMY AND ADENOIDECTOMY  child   TOTAL SHOULDER ARTHROPLASTY Right 02/04/2016   Social History:  reports that he has never smoked. He has never used smokeless tobacco. He reports current alcohol use. He reports that he does not use drugs.  Allergies  Allergen Reactions   Vancomycin Other (See Comments)    Hypotension    Hydrocodone Other (See Comments)    "jumps out of skin"   Penicillins Other (See Comments)    Unknown childhood reaction  Has patient had aCN reaction causing immediate rash, facial/tongue/throat swelling, SOB or lightheadedness with hypotension: No Has patient had a PCN reaction causing severe rash involving mucus membranes or skin necrosis: No Has patient had a PCN reaction that required hospitalization No Has patient had a PCN reaction occurring within the last 10 years: No If all of the above answers are "NO", then may proceed with Cephalosporin use.   Percocet [Oxycodone-Acetaminophen] Other (See Comments)    "jumps out of skin"    Family History  Problem Relation Age of Onset   Parkinsonism Father    Colon cancer Neg Hx    Esophageal cancer Neg Hx    Rectal cancer Neg Hx    Stomach cancer Neg Hx     Prior to Admission medications   Medication Sig Start Date End Date Taking? Authorizing Provider  Aluminum & Magnesium Hydroxide (MAGNESIUM-ALUMINUM PO) Take by mouth. Magnesium 3 tablets daily.    [provider]  amLODipine (NORVASC) 10 MG tablet Take 10 mg by mouth daily in the afternoon. 11/04/21   [provider]  anastrozole (ARIMIDEX) 1 MG tablet Take 1 mg by mouth daily. 11/01/21   [provider]  aspirin EC 81 MG tablet Take 81 mg by mouth daily. Swallow whole.    [provider]  atorvastatin (LIPITOR) 20 MG tablet Take 20 mg by mouth every morning.     [provider]  baclofen (LIORESAL) 10 MG tablet Take 1 tablet by mouth 3 (three) times daily as needed.    [provider]  buPROPion (WELLBUTRIN XL) 150 MG 24 hr tablet Take 150 mg by mouth every morning. 11/26/21   [provider]  buPROPion (WELLBUTRIN XL) 300 MG 24 hr tablet Take 300 mg by mouth every morning.     [provider]  celecoxib (CELEBREX) 200 MG capsule Take 200 mg by mouth daily. 09/12/21   [provider]  EPINEPHrine 0.3 mg/0.3 mL IJ SOAJ injection     [provider]  fluorouracil (EFUDEX) 5 % cream Apply topically. 11/21/21   [provider]  fluticasone (FLONASE) 50 MCG/ACT nasal spray  Place into both nostrils as needed.    [provider]  HYDROcodone-acetaminophen (NORCO/VICODIN) 5-325 MG tablet Take 1 tablet by mouth every 6 (six) hours as needed. 11/20/21   [provider]  irbesartan (AVAPRO) 300 MG tablet Take 300 mg by mouth every morning.     [provider]  Magnesium Gluconate 550 MG TABS Take 550 mg by mouth in the morning, at noon, and at bedtime.    [provider]  metoprolol tartrate (LOPRESSOR) 100 MG tablet Take one tablet ( 100 mg ) by mouth 2 hours prior to your Coronary CTA appointment 12/04/21   Evans Lance, MD  omeprazole (PRILOSEC) 40 MG capsule Take 40 mg by mouth every morning.     [provider]  tamsulosin (FLOMAX) 0.4 MG CAPS capsule Take 0.4 mg by mouth daily. 01/21/21   [provider]  TESTOSTERONE IM Inject 1.5 mLs into the muscle every 14 (fourteen) days.    [provider]  TRINTELLIX 10 MG TABS tablet Take 10 mg by mouth daily. 10/26/20   [provider]    Physical Exam: Vitals:   12/05/21 0830 12/05/21 0900 12/05/21 0930 12/05/21 1107  BP: (!) 134/100 (!) 135/92 (!) 142/85 (!) 141/92  Pulse: 83   81  Resp: '17 13 16 16   '$ Temp:    98.6 F (37 C)  TempSrc:    Oral  SpO2: 95%   94%  Weight:    79.2 kg  Height:    '5\' 7"'$  (1.702 m)   General:  Appears calm and comfortable and is in NAD, sitting up in a bedside chair on RA Eyes:   EOMI, normal lids, iris ENT:  grossly normal hearing, lips & tongue, mmm; appropriate dentition Neck:  no LAD, masses or thyromegaly Cardiovascular:  RRR, no m/r/g.  Respiratory:   CTA bilaterally with no wheezes/rales/rhonchi.  Normal respiratory effort. Abdomen:  soft, NT, ND Skin:  no rash or induration seen on limited exam; mild mottling on entire RLE Musculoskeletal:  fullness with mild edema along entire RLE compared to L with lack of muscular definition resulting; also with L hypothenar atrophy Psychiatric:  grossly normal mood and affect, speech fluent and appropriate, AOx3 Neurologic:  CN 2-12 grossly intact, moves all extremities in coordinated fashion   Radiological Exams on Admission: Independently reviewed - see discussion in A/P where applicable  ECHOCARDIOGRAM LIMITED  Result Date: 12/05/2021    ECHOCARDIOGRAM LIMITED REPORT   Patient Name:   Jacob [name] Date of Exam: 12/05/2021 Medical Rec #:  [id number]       Height:       67.0 in Accession #:    [id number]      Weight:       174.6 lb Date of Birth:  1950/10/25       BSA:          1.909 m Patient Age:    25 years        BP:           141/92 mmHg Patient Gender: M               HR:           94 bpm. Exam Location:  Inpatient Procedure: Limited Echo and Color Doppler STAT ECHO Indications:    Pulmonary Embolus I26.09  History:        Patient has prior history of Echocardiogram examinations, most  recent 11/22/2021. Risk Factors:Hypertension and Dyslipidemia.                 Palpitations.  Sonographer:    Eartha Inch Referring Phys: [facility]  1. Left ventricular ejection fraction, by estimation, is 60 to 65%. The left ventricle has normal function. The left ventricle has no  regional wall motion abnormalities.  2. Right ventricular systolic function is normal. The right ventricular size is normal. Comparison(s): No significant change from prior study. FINDINGS  Left Ventricle: Left ventricular ejection fraction, by estimation, is 60 to 65%. The left ventricle has normal function. The left ventricle has no regional wall motion abnormalities. Right Ventricle: The right ventricular size is normal. Right ventricular systolic function is normal. Left Atrium: Left atrial size was normal in size. Right Atrium: Right atrial size was normal in size. Pericardium: There is no evidence of pericardial effusion. Phineas Inches Electronically signed by Phineas Inches Signature Date/Time: 12/05/2021/12:55:12 PM    Final    CT ABDOMEN PELVIS W CONTRAST  Result Date: 12/04/2021 CLINICAL DATA:  Evaluate for clot in the IVC EXAM: CT ABDOMEN AND PELVIS WITH CONTRAST TECHNIQUE: Multidetector CT imaging of the abdomen and pelvis was performed using the standard protocol following bolus administration of intravenous contrast. RADIATION DOSE REDUCTION: This exam was performed according to the departmental dose-optimization program which includes automated exposure control, adjustment of the mA and/or kV according to patient size and/or use of iterative reconstruction technique. CONTRAST:  18m OMNIPAQUE IOHEXOL 350 MG/ML SOLN COMPARISON:  None Available. FINDINGS: Lower chest: No acute findings Hepatobiliary: No focal hepatic abnormality. Gallbladder unremarkable. Pancreas: No focal abnormality or ductal dilatation. Spleen: No focal abnormality.  Normal size. Adrenals/Urinary Tract: Bilateral renal cysts appear benign. No follow-up imaging recommended. Adrenal glands unremarkable. No renal stones or hydronephrosis. Urinary bladder unremarkable. Stomach/Bowel: Normal appendix. Sigmoid diverticulosis. No active diverticulitis. Stomach and small bowel decompressed, unremarkable. Vascular/Lymphatic: No evidence of  aneurysm or adenopathy. Reproductive: No visible focal abnormality. Penile implant in place. Other: No free fluid or free air. Musculoskeletal: No acute bony abnormality. IMPRESSION: No acute findings in the abdomen or pelvis. Sigmoid diverticulosis. Electronically Signed   By: KRolm BaptiseM.D.   On: 12/04/2021 21:27   CT Angio Chest PE W and/or Wo Contrast  Result Date: 12/04/2021 CLINICAL DATA:  Pulmonary embolism (PE) suspected, high prob EXAM: CT ANGIOGRAPHY CHEST WITH CONTRAST TECHNIQUE: Multidetector CT imaging of the chest was performed using the standard protocol during bolus administration of intravenous contrast. Multiplanar CT image reconstructions and MIPs were obtained to evaluate the vascular anatomy. RADIATION DOSE REDUCTION: This exam was performed according to the departmental dose-optimization program which includes automated exposure control, adjustment of the mA and/or kV according to patient size and/or use of iterative reconstruction technique. CONTRAST:  835mOMNIPAQUE IOHEXOL 350 MG/ML SOLN COMPARISON:  None Available. FINDINGS: Cardiovascular: There are bilateral pulmonary emboli. Saddle embolus noted. Largest clot burden in the right lower lobe. No evidence of right heart strain. Heart is normal size. Aorta normal caliber. Mediastinum/Nodes: No mediastinal, hilar, or axillary adenopathy. Trachea and esophagus are unremarkable. Thyroid unremarkable. Lungs/Pleura: No confluent opacities or effusions. Upper Abdomen: No acute findings Musculoskeletal: Chest wall soft tissues are unremarkable. No acute bony abnormality. Review of the MIP images confirms the above findings. IMPRESSION: Saddle embolus with bilateral pulmonary emboli, largest burden in the right lower lobe. No evidence of right heart strain. These results were called by telephone at the time of interpretation on 12/04/2021 at 9:23 pm  to provider [facility] , who verbally acknowledged these results. Electronically Signed    By: Rolm Baptise M.D.   On: 12/04/2021 21:23   US Venous Img Lower Unilateral Right  Result Date: 12/04/2021 CLINICAL DATA:  Right lower extremity swelling EXAM: RIGHT LOWER EXTREMITY VENOUS DOPPLER ULTRASOUND TECHNIQUE: Gray-scale sonography with compression, as well as color and duplex ultrasound, were performed to evaluate the deep venous system(s) from the level of the common femoral vein through the popliteal and proximal calf veins. COMPARISON:  None Available. FINDINGS: VENOUS There is occlusive expansile thrombus within the right common femoral vein. The profundus femoral vein centrally demonstrates appropriate compressibility but no significant antegrade flow likely related to distal obstruction involving the common femoral vein. There is occlusive thrombus within the femoral vein and popliteal vein. The greater saphenous vein at the saphenofemoral junction is patent. The visualized peroneal veins and at least 1 of the paired posterior tibial veins appear patent Limited views of the contralateral common femoral vein are unremarkable. OTHER None. Limitations: none IMPRESSION: Occlusive DVT involving the right common femoral vein, femoral vein and popliteal vein. If indicated, CT or MR venography of the abdomen pelvis may be helpful to assess the central extent of thrombus. Electronically Signed   By: Fidela Salisbury M.D.   On: 12/04/2021 19:52    EKG: Independently reviewed.  NSR with rate 90; ectopy with new PACs and PVCs compared to prior with no evidence of acute ischemia   Labs on Admission: I have personally reviewed the available labs and imaging studies at the time of the admission.  Pertinent labs:    Normal BMP Normal CBC INR 1.1   Assessment and Plan: Principal Problem:   Acute saddle pulmonary embolism (HCC) Active Problems:   Hypertension   DVT (deep venous thrombosis) (HCC)   Dyslipidemia   Mood disorder (HCC)   Hypogonadism in male    DVT/PE with saddle  embolism -Patient without prior episodes of thromboembolic disease presenting with extensive new DVT/saddle PE -His only apparent risk factors are recent elbow surgery (appears to be low risk) and initiation of Arimidex a month ago -Patient is not showing evidence of hemodynamic instability at this time -Given his hemodynamic stability, his is at intermediate risk -PESI score is Class II, low risk, indicating a 1.7-3.5% 30-day mortality risk -S-PESI score is low, indicating that the patient has a 1.1% risk of death and a 1.5% risk of recurrent VTE or non-fatal bleeding -Will admit on telemetry to progressive care unit -R heart strain was not seen on CT -Echo have been ordered to assess the severity of clot burden and does not show R heart strain -Initiate anticoagulation - for now, will start treatment-dose heparin with plan to transition to alternative [facility] agent tomorrow or the following day - given the extensive clot burden maybe wait one additional day -[place] O2 as needed -We discussed oral AC treatment options and he would like to resume Eliquis -Will request TOC team consultation to assist with Eliquis -Patients are at intermediate risk (3-8%/year) for recurrent VTE if initial clot occurred after minor surgery (recent elbow surgery) or no identifiable RF.  Extended oral anticoagulation of indefinite duration should be considered for patients with a first episode of PE with these issues. -The patient understands that thromboembolic disease can be catastrophic and even deadly and that he must be complaint with physician appointments and anticoagulation. -Vascular surgery will consult for his extensive RLE DVT to determine if thrombectomy should be considered -Hold ASA  for now  HTN -Continue amlodipine, irbesartan  HLD -Continue atorvastatin  Mood d/o -Continue Wellbutrin, Trintellix  Hypogonadism -He has been on testosterone injections through urology -He appears to have started Arimidex  about a month ago; this has a VTE risk associated -Will hold Arimidex for now, ?need to report - will notify pharmacy    Advance Care Planning:   Code Status: Full Code   Consults: Vascular surgery; TOC team  DVT Prophylaxis: Heparin  Family Communication: Wife was present for the conclusion of the encounter  Severity of Illness: The appropriate patient status for this patient is INPATIENT. Inpatient status is judged to be reasonable and necessary in order to provide the required intensity of service to ensure the patient's safety. The patient's presenting symptoms, physical exam findings, and initial radiographic and laboratory data in the context of their chronic comorbidities is felt to place them at high risk for further clinical deterioration. Furthermore, it is not anticipated that the patient will be medically stable for discharge from the hospital within 2 midnights of admission.   * I certify that at the point of admission it is my clinical judgment that the patient will require inpatient hospital care spanning beyond 2 midnights from the point of admission due to high intensity of service, high risk for further deterioration and high frequency of surveillance required.*  Author: Karmen Bongo, MD 12/05/2021 1:52 PM  For on call review [email or web address].

## 2021-12-05 NOTE — Progress Notes (Signed)
[place] for Heparin  Indication: pulmonary embolus and DVT Brief A/P: Heparin level within goal range Continue Heparin at current rate   Phillis Knack, PharmD, BCPS  Allergies  Allergen Reactions   Vancomycin Other (See Comments)    Hypotension    Hydrocodone Other (See Comments)    "jumps out of skin"   Penicillins Other (See Comments)    Unknown childhood reaction  Has patient had aCN reaction causing immediate rash, facial/tongue/throat swelling, SOB or lightheadedness with hypotension: No Has patient had a PCN reaction causing severe rash involving mucus membranes or skin necrosis: No Has patient had a PCN reaction that required hospitalization No Has patient had a PCN reaction occurring within the last 10 years: No If all of the above answers are "NO", then may proceed with Cephalosporin use.   Percocet [Oxycodone-Acetaminophen] Other (See Comments)    "jumps out of skin"    Patient Measurements: Height: '5\' 7"'$  (170.2 cm) Weight: 79.2 kg (174 lb 9.7 oz) IBW/kg (Calculated) : 66.1 Heparin Dosing Weight: 82.1  Vital Signs: Temp: 98.5 F (36.9 C) (09/28 2000) Temp Source: Oral (09/28 2000) BP: 113/72 (09/28 2102) Pulse Rate: 88 (09/28 2000)  Labs: Recent Labs    12/04/21 2013 12/05/21 0604 12/05/21 1452 12/05/21 2308  HGB 16.7 15.3  --  14.2  HCT 47.6 44.2  --  40.8  PLT 151 151  --  162  LABPROT 13.9  --   --   --   INR 1.1  --   --   --   HEPARINUNFRC  --  0.97* 0.28* 0.34  CREATININE 1.03  --   --   --      Estimated Creatinine Clearance: 63.3 mL/min (by C-G formula based on SCr of 1.03 mg/dL).   Assessment: 71 y.o. male with DVT/ PE for heparin  Goal of Therapy:  Heparin level 0.3-0.7 units/ml Monitor platelets by anticoagulation protocol: Yes   Plan:  Continue Heparin at current rate   Phillis Knack, PharmD, BCPS

## 2021-12-05 NOTE — Progress Notes (Signed)
Thigh ted hose measurement for patient right leg are  Ankle: 22.5cm  Calf: 35cm  Thigh: 52cm  Right lower extremity: 67cm  NS ordered appropriate thigh ted hose stockings per MD order.

## 2021-12-05 NOTE — Plan of Care (Signed)
  Problem: Education: Goal: Knowledge of General Education information will improve Description: Including pain rating scale, medication(s)/side effects and non-pharmacologic comfort measures Outcome: Progressing   Problem: Clinical Measurements: Goal: Ability to maintain clinical measurements within normal limits will improve Outcome: Progressing Goal: Will remain free from infection Outcome: Progressing   

## 2021-12-05 NOTE — TOC Progression Note (Addendum)
Transition of Care [facility]) - Progression Note    Patient Details  Name: CLINE DRAHEIM MRN: [id number] Date of Birth: [birth year]  Transition of [facility]) CM/SW Contact  Zenon Mayo, RN Phone Number: 12/05/2021, 2:26 PM  Clinical Narrative:    from home, Bil acute saddle PE, DVT RLE, sob, conts on hep drip, ? Vascular surgery consult. Benefit check for eliquis in process.  TOC following. Per benefit check his copay is $138.07.        Expected Discharge Plan and Services                                                 Social Determinants of Health (SDOH) Interventions    Readmission Risk Interventions     No data to display

## 2021-12-05 NOTE — Progress Notes (Signed)
ANTICOAGULATION CONSULT NOTE - Initial Consult  Pharmacy Consult for Heparin infusion Indication: pulmonary embolus and DVT  Allergies  Allergen Reactions   Vancomycin Other (See Comments)    Hypotension    Hydrocodone Other (See Comments)    "jumps out of skin"   Penicillins Other (See Comments)    Unknown childhood reaction  Has patient had aCN reaction causing immediate rash, facial/tongue/throat swelling, SOB or lightheadedness with hypotension: No Has patient had a PCN reaction causing severe rash involving mucus membranes or skin necrosis: No Has patient had a PCN reaction that required hospitalization No Has patient had a PCN reaction occurring within the last 10 years: No If all of the above answers are "NO", then may proceed with Cephalosporin use.   Percocet [Oxycodone-Acetaminophen] Other (See Comments)    "jumps out of skin"    Patient Measurements: Height: '5\' 7"'$  (170.2 cm) Weight: 79.2 kg (174 lb 9.7 oz) IBW/kg (Calculated) : 66.1 Heparin Dosing Weight: 82.1  Vital Signs: Temp: 98 F (36.7 C) (09/28 1610) Temp Source: Oral (09/28 1610) BP: 131/88 (09/28 1610) Pulse Rate: 91 (09/28 1610)  Labs: Recent Labs    12/04/21 2013 12/05/21 0604 12/05/21 1452  HGB 16.7 15.3  --   HCT 47.6 44.2  --   PLT 151 151  --   LABPROT 13.9  --   --   INR 1.1  --   --   HEPARINUNFRC  --  0.97* 0.28*  CREATININE 1.03  --   --      Estimated Creatinine Clearance: 63.3 mL/min (by C-G formula based on SCr of 1.03 mg/dL).   Medical History: Past Medical History:  Diagnosis Date   Depression    DJD (degenerative joint disease)    ED (erectile dysfunction) of organic origin    GERD (gastroesophageal reflux disease)    Hemorrhoids    History of adenomatous polyp of colon    12-11-2014  tubular adenoam, hyperplastic   Hyperlipidemia    Hypertension     Medications:  Medications Prior to Admission  Medication Sig Dispense Refill Last Dose   apixaban (ELIQUIS) 5  MG TABS tablet Take 5 mg by mouth 2 (two) times daily.   Past Month at 2030   baclofen (LIORESAL) 10 MG tablet Take 1 tablet by mouth 3 (three) times daily as needed.   Past Month   EPINEPHrine 0.3 mg/0.3 mL IJ SOAJ injection    Not in years   MAGNESIUM MALATE PO Take 1,350 mg by mouth in the morning, at noon, and at bedtime.   12/03/2021   Omega-3 Fatty Acids (FISH OIL PO) Take 2 tablets by mouth daily.   12/02/2021   valACYclovir (VALTREX) 500 MG tablet Take 500 mg by mouth as needed (for flares).   May or June   amLODipine (NORVASC) 10 MG tablet Take 10 mg by mouth daily in the afternoon.   12/03/2021   anastrozole (ARIMIDEX) 1 MG tablet Take 1 mg by mouth daily.   12/03/2021   aspirin EC 81 MG tablet Take 81 mg by mouth daily. Swallow whole.   12/02/2021   atorvastatin (LIPITOR) 20 MG tablet Take 20 mg by mouth every morning.    12/03/2021   buPROPion (WELLBUTRIN XL) 150 MG 24 hr tablet Take 150 mg by mouth every morning. (Patient not taking: Reported on 12/05/2021)   Not Taking   buPROPion (WELLBUTRIN XL) 300 MG 24 hr tablet Take 300 mg by mouth every morning.    12/03/2021   celecoxib (  CELEBREX) 200 MG capsule Take 200 mg by mouth daily.   12/03/2021   fluorouracil (EFUDEX) 5 % cream Apply topically.   not started yet   HYDROcodone-acetaminophen (NORCO/VICODIN) 5-325 MG tablet Take 1 tablet by mouth every 6 (six) hours as needed. (Patient not taking: Reported on 12/05/2021)   Not Taking   irbesartan (AVAPRO) 300 MG tablet Take 300 mg by mouth every morning.    12/03/2021   metoprolol tartrate (LOPRESSOR) 100 MG tablet Take one tablet ( 100 mg ) by mouth 2 hours prior to your Coronary CTA appointment (Patient not taking: Reported on 12/05/2021) 1 tablet 0 Not Taking   omeprazole (PRILOSEC) 40 MG capsule Take 40 mg by mouth every morning.    12/03/2021   tamsulosin (FLOMAX) 0.4 MG CAPS capsule Take 0.4 mg by mouth daily.   12/03/2021   TESTOSTERONE IM Inject 1.5 mLs into the muscle every 14 (fourteen) days.    12/02/2021   TRINTELLIX 10 MG TABS tablet Take 10 mg by mouth daily.   12/03/2021    Assessment: 71 yo M presents with swelling and pain in RLE. Found to have RLE DVT and saddle PE. Patient was not taking any anticoagulation prior to admission. He was prescribed Eliquis but have not taken in 2-3 weeks prior to admission. Pharmacy consulted to dose and manage heparin infusion for treatment of VTE.  Heparin level subtherapeutic at 0.28. Hemoglobin and renal function stable. Patient previously supra-therapeutic while on 1400 units/hr. Patient stable, currently on room air.   Goal of Therapy:  Heparin level 0.3-0.7 units/ml Monitor platelets by anticoagulation protocol: Yes   Plan:  Increase heparin infusion at 1300 units/hr  Check heparin level in 6 hours Monitor daily CBC, heparin level, and for s/sx of bleeding  Esmeralda Arthur, PharmD   12/05/2021 4:26 PM   Please refer uo AMION for pharmacy phone number

## 2021-12-05 NOTE — Telephone Encounter (Signed)
Pharmacy Patient Advocate Encounter  Insurance verification completed.    The patient is insured through [employer] Part D   The patient is currently admitted and ran test claims for the following: Eliquis .  Copays and coinsurance results were relayed to Inpatient clinical team.      

## 2021-12-05 NOTE — TOC Benefit Eligibility Note (Signed)
Patient Teacher, English as a foreign language completed.    The patient is currently admitted and upon discharge could be taking Eliquis 5 mg.  The current 30 day co-pay is $138.07.   The patient is insured through [place] Patient Advocate Specialist [place] Patient Advocate Team Direct Number: [phone]  Fax: [phone]

## 2021-12-06 ENCOUNTER — Encounter: Payer: Self-pay | Admitting: Internal Medicine

## 2021-12-06 ENCOUNTER — Other Ambulatory Visit (HOSPITAL_COMMUNITY): Payer: Self-pay

## 2021-12-06 DIAGNOSIS — I2692 Saddle embolus of pulmonary artery without acute cor pulmonale: Secondary | ICD-10-CM | POA: Diagnosis not present

## 2021-12-06 LAB — HEPARIN LEVEL (UNFRACTIONATED): Heparin Unfractionated: 0.59 IU/mL (ref 0.30–0.70)

## 2021-12-06 LAB — HIV ANTIBODY (ROUTINE TESTING W REFLEX): HIV Screen 4th Generation wRfx: NONREACTIVE

## 2021-12-06 MED ORDER — APIXABAN 5 MG PO TABS
10.0000 mg | ORAL_TABLET | Freq: Two times a day (BID) | ORAL | Status: DC
  Administered 2021-12-06: 10 mg via ORAL
  Filled 2021-12-06: qty 2

## 2021-12-06 MED ORDER — APIXABAN (ELIQUIS) VTE STARTER PACK (10MG AND 5MG)
ORAL_TABLET | ORAL | 0 refills | Status: DC
  Filled 2021-12-06: qty 74, 30d supply, fill #0

## 2021-12-06 MED ORDER — APIXABAN 5 MG PO TABS: 5.0000 mg | ORAL_TABLET | Freq: Two times a day (BID) | ORAL | Status: DC

## 2021-12-06 NOTE — Discharge Instructions (Addendum)
Information on my medicine - ELIQUIS (apixaban)  Why was Eliquis prescribed for you? Eliquis was prescribed to treat blood clots that may have been found in the veins of your legs (deep vein thrombosis) or in your lungs (pulmonary embolism) and to reduce the risk of them occurring again.  What do You need to know about Eliquis ? The starting dose is 10 mg (two 5 mg tablets) taken TWICE daily for the FIRST SEVEN (7) DAYS, then on Friday October 6th  (12/13/2021)  the dose is reduced to ONE 5 mg tablet taken TWICE daily.  Eliquis may be taken with or without food.   Try to take the dose about the same time in the morning and in the evening. If you have difficulty swallowing the tablet whole please discuss with your pharmacist how to take the medication safely.  Take Eliquis exactly as prescribed and DO NOT stop taking Eliquis without talking to the doctor who prescribed the medication.  Stopping may increase your risk of developing a new blood clot.  Refill your prescription before you run out.  After discharge, you should have regular check-up appointments with your healthcare provider that is prescribing your Eliquis.    What do you do if you miss a dose? If a dose of ELIQUIS is not taken at the scheduled time, take it as soon as possible on the same day and twice-daily administration should be resumed. The dose should not be doubled to make up for a missed dose.  Important Safety Information A possible side effect of Eliquis is bleeding. You should call your healthcare provider right away if you experience any of the following: Bleeding from an injury or your nose that does not stop. Unusual colored urine (red or dark brown) or unusual colored stools (red or black). Unusual bruising for unknown reasons. A serious fall or if you hit your head (even if there is no bleeding).  Some medicines may interact with Eliquis and might increase your risk of bleeding or clotting while on  Eliquis. To help avoid this, consult your healthcare provider or pharmacist prior to using any new prescription or non-prescription medications, including herbals, vitamins, non-steroidal anti-inflammatory drugs (NSAIDs) and supplements.  This website has more information on Eliquis (apixaban): http://www.eliquis.com/eliquis/home

## 2021-12-06 NOTE — Progress Notes (Addendum)
[place] for Heparin  Indication: pulmonary embolus and DVT  Allergies  Allergen Reactions   Vancomycin Other (See Comments)    Hypotension    Hydrocodone Other (See Comments)    "jumps out of skin"   Penicillins Other (See Comments)    Unknown childhood reaction  Has patient had aCN reaction causing immediate rash, facial/tongue/throat swelling, SOB or lightheadedness with hypotension: No Has patient had a PCN reaction causing severe rash involving mucus membranes or skin necrosis: No Has patient had a PCN reaction that required hospitalization No Has patient had a PCN reaction occurring within the last 10 years: No If all of the above answers are "NO", then may proceed with Cephalosporin use.   Percocet [Oxycodone-Acetaminophen] Other (See Comments)    "jumps out of skin"    Patient Measurements: Height: '5\' 7"'$  (170.2 cm) Weight: 80 kg (176 lb 6.4 oz) IBW/kg (Calculated) : 66.1 Heparin Dosing Weight: 82.1  Vital Signs: Temp: 98 F (36.7 C) (09/29 0717) Temp Source: Oral (09/29 0717) BP: 135/89 (09/29 0717) Pulse Rate: 75 (09/29 0717)  Labs: Recent Labs    12/04/21 2013 12/04/21 2013 12/05/21 0604 12/05/21 1452 12/05/21 2308 12/06/21 0624  HGB 16.7  --  15.3  --  14.2  --   HCT 47.6  --  44.2  --  40.8  --   PLT 151  --  151  --  162  --   LABPROT 13.9  --   --   --   --   --   INR 1.1  --   --   --   --   --   HEPARINUNFRC  --    < > 0.97* 0.28* 0.34 0.59  CREATININE 1.03  --   --   --   --   --    < > = values in this interval not displayed.     Estimated Creatinine Clearance: 68.6 mL/min (by C-G formula based on SCr of 1.03 mg/dL).   Assessment: 71 y.o. male found to have RLE DVT and saddle PE. Patient was not taking any anticoagulation prior to admission. He was prescribed Eliquis but have not taken in 2-3 weeks prior to admission. Pharmacy consulted to dose and manage heparin infusion for treatment of VTE.  HL  0.53 (therapeutic) Hgb 14.2/Plt 162 WNL   Goal of Therapy:  Heparin level 0.3-0.7 units/ml Monitor platelets by anticoagulation protocol: Yes   Plan:  Continue heparin at currently therapeutic rate of 1300 units/hr F/u daily CBC, HL  F/u ability to transition to enteral anticoagulants    ---------- Addendum: Pharmacy consulted to transition from heparin gtt to apixaban for treatment of VTE.  Eliquis '10mg'$  BID x7 days followed by eliquis '5mg'$  BID thereafter  Turn off heparin drip and give first dose of eliquis at that time - plan communicated with RN  F/u CBC, s/sx bleeding  Education completed with patient; patient is aware and okay with co-pay of $138/mo   Wilson Singer, PharmD Clinical Pharmacist 12/06/2021 7:24 AM

## 2021-12-06 NOTE — TOC Transition Note (Signed)
Transition of Care [facility]) - CM/SW Discharge Note   Patient Details  Name: Jacob [name] MRN: [id number] Date of Birth: Oct 18, 1950  [facility] CM/SW Contact:  Zenon Mayo, RN Phone Number: 12/06/2021, 11:59 AM   Clinical Narrative:    Patient is for dc today, wife is in the room and will be transporting him home, NCM informed her about the copay of 138 for eliquis, she states yes they are familiar with eliqus, he has been on it before.  TOC to fill meds for him prior to dc.         Patient Goals and CMS Choice        Discharge Placement                       Discharge Plan and Services                                     Social Determinants of Health (SDOH) Interventions     Readmission Risk Interventions     No data to display

## 2021-12-06 NOTE — Progress Notes (Signed)
Pt has orders to be discharged. Discharge instructions given and pt has no additional questions at this time. Medication regimen reviewed and pt educated. Pt verbalized understanding and has no additional questions. Telemetry box removed. IV removed and site in good condition. Pt stable and waiting for transportation. 

## 2021-12-06 NOTE — Discharge Summary (Signed)
Physician Discharge Summary  Jacob [name] DOB: 04-09-1950 DOA: 12/04/2021  PCP: Ginger Organ., MD  Admit date: 12/04/2021 Discharge date: 12/06/2021  Admitted From: home Disposition:  home  Recommendations for Outpatient Follow-up:  Follow up with PCP in 1-2 weeks  Home Health: none Equipment/Devices: none  Discharge Condition: stable CODE STATUS: Full code  HPI: Per admitting MD, Jacob [name] is a 71 y.o. male with medical history significant of HTN and HLD presenting with  RLE edema.  He had been seen on 9/1 by cardiology - [employer] had previously reported an episode of afib and he was started on Eliquis but this was stopped by cardiology after 4-5 days after strip review showed NSR with frequent PACs and he was started on a Zio patch.  9/15 echo showed preserved EF and grade 1 diastolic dysfunction.  He called cardiology on 9/22 with c/o intermittent light-headedness and SOB.  He was seen again by cardiology yesterday and was planned for a coronary CTA due to SOB/DOE.  He had mild R ankle edema at the time of the appointment but it worsened significantly during the day and so he went to the ER.  He noticed the prior day that his ankle was swelling  a bit, very unsual.  He didn't think about mentioning it at the appointment.  His leg started feeling more tight through the afternoon.  His leg was very swollen.  He messaged the cardiology office and they told him to go to DB.  His symptoms for 8 weeks - accelerated HR that never drops but last Friday it wouldn't go above 60.  Yesterday was the first time he had leg symptoms.  SOB has been x 3 weeks, has to sit when he gets up stairs.  No prior h/o clots.  He had elbow surgery 2 weeks ago yesterday at [facility].  He started noticing some atrophy in his left hand about a year ago.  He had MRIs, myelogram, and nerve conduction study.  Decided to release the ulnar nerve to see if that would improve the atrophy.   He has not been less mobile than usual.  Walked 2-3 miles a couple of times a week.  No Fh of clotting disorders.  He is up to date on cancer screenings and no h/o malignancy  Hospital Course / Discharge diagnoses: Principal Problem:   Acute saddle pulmonary embolism ([place]) Active Problems:   Hypertension   DVT (deep venous thrombosis) (H[facility])   Dyslipidemia   Mood disorder (HCC)   Hypogonadism in male  Principal problem DVT/PE with saddle embolism -Patient without prior episodes of thromboembolic disease presenting with extensive new DVT/saddle PE. His only apparent risk factors are recent elbow surgery (appears to be low risk) and initiation of Arimidex a month ago.  Patient was initially placed on heparin and vascular surgery was consulted, and no interventions were indicated given rapid improvement.  He tolerated anticoagulation well, and eventually was transitioned to Eliquis.  Patient lower extremity swelling is significantly improved and almost entirely resolved.  His shortness of breath with exertion has resolved, he is able to ambulate without any chest pain, palpitations, or further dyspnea.  A 2D echo was done which showed normal LVEF at 60-65%, no WMA's, RV was found to be normal.  He was not hypoxic, hypotensive or significantly tachycardic, and with clinical improvement he was discharged home in stable condition.   Active problems HTN -continue home regimen HLD-Continue atorvastatin Mood  d/o -Continue Wellbutrin, Trintellix Hypogonadism -will need to discuss with urology whether to continue testosterone given DVT/PE  Sepsis ruled out   Discharge Instructions   Allergies as of 12/06/2021       Reactions   Vancomycin Other (See Comments)   Hypotension    Hydrocodone Other (See Comments)   "jumps out of skin"   Penicillins Other (See Comments)   Unknown childhood reaction Has patient had aCN reaction causing immediate rash, facial/tongue/throat swelling, SOB or  lightheadedness with hypotension: No Has patient had a PCN reaction causing severe rash involving mucus membranes or skin necrosis: No Has patient had a PCN reaction that required hospitalization No Has patient had a PCN reaction occurring within the last 10 years: No If all of the above answers are "NO", then may proceed with Cephalosporin use.   Percocet [oxycodone-acetaminophen] Other (See Comments)   "jumps out of skin"        Medication List     STOP taking these medications    aspirin EC 81 MG tablet   Eliquis 5 MG Tabs tablet Generic drug: apixaban Replaced by: Apixaban Starter Pack ('10mg'$  and '5mg'$ )   metoprolol tartrate 100 MG tablet Commonly known as: LOPRESSOR       TAKE these medications    amLODipine 10 MG tablet Commonly known as: NORVASC Take 10 mg by mouth daily in the afternoon.   anastrozole 1 MG tablet Commonly known as: ARIMIDEX Take 1 mg by mouth daily.   Apixaban Starter Pack ('10mg'$  and '5mg'$ ) Commonly known as: ELIQUIS STARTER PACK Take as directed on package: start with two-'5mg'$  tablets twice daily for 7 days. On day 8, switch to one-'5mg'$  tablet twice daily. Replaces: Eliquis 5 MG Tabs tablet   atorvastatin 20 MG tablet Commonly known as: LIPITOR Take 20 mg by mouth every morning.   baclofen 10 MG tablet Commonly known as: LIORESAL Take 1 tablet by mouth 3 (three) times daily as needed.   buPROPion 300 MG 24 hr tablet Commonly known as: WELLBUTRIN XL Take 300 mg by mouth every morning. What changed: Another medication with the same name was removed. Continue taking this medication, and follow the directions you see here.   celecoxib 200 MG capsule Commonly known as: CELEBREX Take 200 mg by mouth daily.   EPINEPHrine 0.3 mg/0.3 mL Soaj injection Commonly known as: EPI-PEN   FISH OIL PO Take 2 tablets by mouth daily.   fluorouracil 5 % cream Commonly known as: EFUDEX Apply topically.   HYDROcodone-acetaminophen 5-325 MG  tablet Commonly known as: NORCO/VICODIN Take 1 tablet by mouth every 6 (six) hours as needed.   irbesartan 300 MG tablet Commonly known as: AVAPRO Take 300 mg by mouth every morning.   MAGNESIUM MALATE PO Take 1,350 mg by mouth in the morning, at noon, and at bedtime.   omeprazole 40 MG capsule Commonly known as: PRILOSEC Take 40 mg by mouth every morning.   tamsulosin 0.4 MG Caps capsule Commonly known as: FLOMAX Take 0.4 mg by mouth daily.   TESTOSTERONE IM Inject 1.5 mLs into the muscle every 14 (fourteen) days.   Trintellix 10 MG Tabs tablet Generic drug: vortioxetine HBr Take 10 mg by mouth daily.   valACYclovir 500 MG tablet Commonly known as: VALTREX Take 500 mg by mouth as needed (for flares).       Consultations: Vascular surgery  Procedures/Studies:  ECHOCARDIOGRAM LIMITED  Result Date: 12/05/2021    ECHOCARDIOGRAM LIMITED REPORT   Patient Name:   Jacob [name] Date of Exam: 12/05/2021 Medical Rec #:  [id number]       Height:       67.0 in Accession #:    [id number]      Weight:       174.6 lb Date of Birth:  02-Apr-1950       BSA:          1.909 m Patient Age:    33 years        BP:           141/92 mmHg Patient Gender: M               HR:           94 bpm. Exam Location:  Inpatient Procedure: Limited Echo and Color Doppler STAT ECHO Indications:    Pulmonary Embolus I26.09  History:        Patient has prior history of Echocardiogram examinations, most                 recent 11/22/2021. Risk Factors:Hypertension and Dyslipidemia.                 Palpitations.  Sonographer:    Eartha Inch Referring Phys: [place]  1. Left ventricular ejection fraction, by estimation, is 60 to 65%. The left ventricle has normal function. The left ventricle has no regional wall motion abnormalities.  2. Right ventricular systolic function is normal. The right ventricular size is normal. Comparison(s): No significant change from prior study. FINDINGS  Left  Ventricle: Left ventricular ejection fraction, by estimation, is 60 to 65%. The left ventricle has normal function. The left ventricle has no regional wall motion abnormalities. Right Ventricle: The right ventricular size is normal. Right ventricular systolic function is normal. Left Atrium: Left atrial size was normal in size. Right Atrium: Right atrial size was normal in size. Pericardium: There is no evidence of pericardial effusion. Phineas Inches Electronically signed by Phineas Inches Signature Date/Time: 12/05/2021/12:55:12 PM    Final    CT ABDOMEN PELVIS W CONTRAST  Result Date: 12/04/2021 CLINICAL DATA:  Evaluate for clot in the IVC EXAM: CT ABDOMEN AND PELVIS WITH CONTRAST TECHNIQUE: Multidetector CT imaging of the abdomen and pelvis was performed using the standard protocol following bolus administration of intravenous contrast. RADIATION DOSE REDUCTION: This exam was performed according to the departmental dose-optimization program which includes automated exposure control, adjustment of the mA and/or kV according to patient size and/or use of iterative reconstruction technique. CONTRAST:  49m OMNIPAQUE IOHEXOL 350 MG/ML SOLN COMPARISON:  None Available. FINDINGS: Lower chest: No acute findings Hepatobiliary: No focal hepatic abnormality. Gallbladder unremarkable. Pancreas: No focal abnormality or ductal dilatation. Spleen: No focal abnormality.  Normal size. Adrenals/Urinary Tract: Bilateral renal cysts appear benign. No follow-up imaging recommended. Adrenal glands unremarkable. No renal stones or hydronephrosis. Urinary bladder unremarkable. Stomach/Bowel: Normal appendix. Sigmoid diverticulosis. No active diverticulitis. Stomach and small bowel decompressed, unremarkable. Vascular/Lymphatic: No evidence of aneurysm or adenopathy. Reproductive: No visible focal abnormality. Penile implant in place. Other: No free fluid or free air. Musculoskeletal: No acute bony abnormality. IMPRESSION: No acute  findings in the abdomen or pelvis. Sigmoid diverticulosis. Electronically Signed   By: KRolm BaptiseM.D.   On: 12/04/2021 21:27   CT Angio Chest PE W and/or Wo Contrast  Result Date: 12/04/2021 CLINICAL DATA:  Pulmonary embolism (PE) suspected, high prob EXAM: CT ANGIOGRAPHY CHEST WITH CONTRAST TECHNIQUE: Multidetector CT imaging of the chest was performed using the standard protocol  during bolus administration of intravenous contrast. Multiplanar CT image reconstructions and MIPs were obtained to evaluate the vascular anatomy. RADIATION DOSE REDUCTION: This exam was performed according to the departmental dose-optimization program which includes automated exposure control, adjustment of the mA and/or kV according to patient size and/or use of iterative reconstruction technique. CONTRAST:  87m OMNIPAQUE IOHEXOL 350 MG/ML SOLN COMPARISON:  None Available. FINDINGS: Cardiovascular: There are bilateral pulmonary emboli. Saddle embolus noted. Largest clot burden in the right lower lobe. No evidence of right heart strain. Heart is normal size. Aorta normal caliber. Mediastinum/Nodes: No mediastinal, hilar, or axillary adenopathy. Trachea and esophagus are unremarkable. Thyroid unremarkable. Lungs/Pleura: No confluent opacities or effusions. Upper Abdomen: No acute findings Musculoskeletal: Chest wall soft tissues are unremarkable. No acute bony abnormality. Review of the MIP images confirms the above findings. IMPRESSION: Saddle embolus with bilateral pulmonary emboli, largest burden in the right lower lobe. No evidence of right heart strain. These results were called by telephone at the time of interpretation on 12/04/2021 at 9:23 pm to provider [facility], who verbally acknowledged these results. Electronically Signed   By: KRolm BaptiseM.D.   On: 12/04/2021 21:23   UKoreaVenous Img Lower Unilateral Right  Result Date: 12/04/2021 CLINICAL DATA:  Right lower extremity swelling EXAM: RIGHT LOWER EXTREMITY  VENOUS DOPPLER ULTRASOUND TECHNIQUE: Gray-scale sonography with compression, as well as color and duplex ultrasound, were performed to evaluate the deep venous system(s) from the level of the common femoral vein through the popliteal and proximal calf veins. COMPARISON:  None Available. FINDINGS: VENOUS There is occlusive expansile thrombus within the right common femoral vein. The profundus femoral vein centrally demonstrates appropriate compressibility but no significant antegrade flow likely related to distal obstruction involving the common femoral vein. There is occlusive thrombus within the femoral vein and popliteal vein. The greater saphenous vein at the saphenofemoral junction is patent. The visualized peroneal veins and at least 1 of the paired posterior tibial veins appear patent Limited views of the contralateral common femoral vein are unremarkable. OTHER None. Limitations: none IMPRESSION: Occlusive DVT involving the right common femoral vein, femoral vein and popliteal vein. If indicated, CT or MR venography of the abdomen pelvis may be helpful to assess the central extent of thrombus. Electronically Signed   By: AFidela SalisburyM.D.   On: 12/04/2021 19:52   ECHOCARDIOGRAM COMPLETE  Result Date: 11/22/2021    ECHOCARDIOGRAM REPORT   Patient Name:   Jacob [name] Date of Exam: 11/22/2021 Medical Rec #:  [id number]      Height:       67.0 in Accession #:    [id number]     Weight:       181.6 lb Date of Birth:  9February 12, 1952      BSA:          1.941 m Patient Age:    69years        BP:           120/86 mmHg Patient Gender: M               HR:           86 bpm. Exam [facility]:  [facility]: 2D Echo, Cardiac Doppler and Color Doppler Indications:    R00.2 Palpitations  History:        Patient has no prior history of Echocardiogram examinations.  Abnormal ECG, Signs/Symptoms:Dizziness/Lightheadedness; Risk                 Factors:Hypertension and Dyslipidemia. Palpitations.   Sonographer:    Deliah Boston RDCS Referring Phys: Donato Heinz IMPRESSIONS  1. Left ventricular ejection fraction, by estimation, is 60 to 65%. The left ventricle has normal function. The left ventricle has no regional wall motion abnormalities. Left ventricular diastolic parameters are consistent with Grade I diastolic dysfunction (impaired relaxation).  2. Right ventricular systolic function is normal. The right ventricular size is normal. There is normal pulmonary artery systolic pressure.  3. Left atrial size was mildly dilated.  4. The mitral valve is normal in structure. No evidence of mitral valve regurgitation. No evidence of mitral stenosis.  5. The aortic valve is tricuspid. Aortic valve regurgitation is not visualized. No aortic stenosis is present.  6. The inferior vena cava is normal in size with greater than 50% respiratory variability, suggesting right atrial pressure of 3 mmHg. FINDINGS  Left Ventricle: Left ventricular ejection fraction, by estimation, is 60 to 65%. The left ventricle has normal function. The left ventricle has no regional wall motion abnormalities. The left ventricular internal cavity size was normal in size. There is  no left ventricular hypertrophy. Left ventricular diastolic parameters are consistent with Grade I diastolic dysfunction (impaired relaxation). Normal left ventricular filling pressure. Right Ventricle: The right ventricular size is normal. No increase in right ventricular wall thickness. Right ventricular systolic function is normal. There is normal pulmonary artery systolic pressure. The tricuspid regurgitant velocity is 2.74 m/s, and  with an assumed right atrial pressure of 3 mmHg, the estimated right ventricular systolic pressure is 62.5 mmHg. Left Atrium: Left atrial size was mildly dilated. Right Atrium: Right atrial size was normal in size. Pericardium: There is no evidence of pericardial effusion. Mitral Valve: The mitral valve is normal in  structure. No evidence of mitral valve regurgitation. No evidence of mitral valve stenosis. Tricuspid Valve: The tricuspid valve is normal in structure. Tricuspid valve regurgitation is trivial. No evidence of tricuspid stenosis. Aortic Valve: The aortic valve is tricuspid. Aortic valve regurgitation is not visualized. No aortic stenosis is present. Pulmonic Valve: The pulmonic valve was normal in structure. Pulmonic valve regurgitation is not visualized. No evidence of pulmonic stenosis. Aorta: The aortic root is normal in size and structure. Venous: The inferior vena cava is normal in size with greater than 50% respiratory variability, suggesting right atrial pressure of 3 mmHg. IAS/Shunts: No atrial level shunt detected by color flow Doppler.  LEFT VENTRICLE PLAX 2D LVIDd:         5.20 cm   Diastology LVIDs:         2.90 cm   LV e' medial:    7.72 cm/s LV PW:         0.90 cm   LV E/e' medial:  8.9 LV IVS:        1.00 cm   LV e' lateral:   12.90 cm/s LVOT diam:     2.50 cm   LV E/e' lateral: 5.3 LV SV:         73 LV SV Index:   38 LVOT Area:     4.91 cm  RIGHT VENTRICLE RV Basal diam:  3.60 cm RV S prime:     21.10 cm/s TAPSE (M-mode): 2.4 cm LEFT ATRIUM             Index        RIGHT ATRIUM  Index LA diam:        3.90 cm 2.01 cm/m   RA Area:     19.30 cm LA Vol (A2C):   52.3 ml 26.95 ml/m  RA Volume:   57.30 ml  29.52 ml/m LA Vol (A4C):   58.9 ml 30.35 ml/m LA Biplane Vol: 58.1 ml 29.93 ml/m  AORTIC VALVE LVOT Vmax:   84.10 cm/s LVOT Vmean:  57.400 cm/s LVOT VTI:    0.150 m  AORTA Ao Root diam: 3.50 cm MITRAL VALVE               TRICUSPID VALVE MV Area (PHT): cm         TR Peak grad:   30.0 mmHg MV Decel Time: 193 msec    TR Vmax:        274.00 cm/s MV E velocity: 68.42 cm/s MV A velocity: 92.85 cm/s  SHUNTS MV E/A ratio:  0.74        Systemic VTI:  0.15 m                            Systemic Diam: 2.50 cm Dani Gobble Croitoru MD Electronically signed by Sanda Klein MD Signature Date/Time:  11/22/2021/4:40:49 PM    Final     Subjective: - no chest pain, shortness of breath, no abdominal pain, nausea or vomiting.   Discharge Exam: BP 110/75   Pulse 87   Temp 98.1 F (36.7 C) (Oral)   Resp 18   Ht '5\' 7"'$  (1.702 m)   Wt 80 kg   SpO2 97%   BMI 27.63 kg/m   General: Pt is alert, awake, not in acute distress Cardiovascular: RRR, S1/S2 +, no rubs, no gallops Respiratory: CTA bilaterally, no wheezing, no rhonchi Abdominal: Soft, NT, ND, bowel sounds + Extremities: no edema, no cyanosis   The results of significant diagnostics from this hospitalization (including imaging, microbiology, ancillary and laboratory) are listed below for reference.     Microbiology: No results found for this or any previous visit (from the past 240 hour(s)).   Labs: Basic Metabolic Panel: Recent Labs  Lab 12/04/21 2013  NA 139  K 4.3  CL 100  CO2 26  GLUCOSE 84  BUN 20  CREATININE 1.03  CALCIUM 9.5   Liver Function Tests: No results for input(s): "AST", "ALT", "ALKPHOS", "BILITOT", "PROT", "ALBUMIN" in the last 168 hours. CBC: Recent Labs  Lab 12/04/21 2013 12/05/21 0604 12/05/21 2308  WBC 6.3 5.7 6.8  NEUTROABS 4.4  --   --   HGB 16.7 15.3 14.2  HCT 47.6 44.2 40.8  MCV 94.6 94.4 95.1  PLT 151 151 162   CBG: No results for input(s): "GLUCAP" in the last 168 hours. Hgb A1c No results for input(s): "HGBA1C" in the last 72 hours. Lipid Profile No results for input(s): "CHOL", "HDL", "LDLCALC", "TRIG", "CHOLHDL", "LDLDIRECT" in the last 72 hours. Thyroid function studies No results for input(s): "TSH", "T4TOTAL", "T3FREE", "THYROIDAB" in the last 72 hours.  Invalid input(s): "FREET3" Urinalysis No results found for: "COLORURINE", "APPEARANCEUR", "LABSPEC", "PHURINE", "GLUCOSEU", "HGBUR", "BILIRUBINUR", "KETONESUR", "PROTEINUR", "UROBILINOGEN", "NITRITE", "LEUKOCYTESUR"  FURTHER DISCHARGE INSTRUCTIONS:   Get Medicines reviewed and adjusted: Please take all your  medications with you for your next visit with your Primary MD   Laboratory/radiological data: Please request your Primary MD to go over all hospital tests and procedure/radiological results at the follow up, please ask your Primary MD to get all Hospital records sent  to his/her office.   In some cases, they will be blood work, cultures and biopsy results pending at the time of your discharge. Please request that your primary care M.D. goes through all the records of your hospital data and follows up on these results.   Also Note the following: If you experience worsening of your admission symptoms, develop shortness of breath, life threatening emergency, suicidal or homicidal thoughts you must seek medical attention immediately by calling 911 or calling your MD immediately  if symptoms less severe.   You must read complete instructions/literature along with all the possible adverse reactions/side effects for all the Medicines you take and that have been prescribed to you. Take any new Medicines after you have completely understood and accpet all the possible adverse reactions/side effects.    Do not drive when taking Pain medications or sleeping medications (Benzodaizepines)   Do not take more than prescribed Pain, Sleep and Anxiety Medications. It is not advisable to combine anxiety,sleep and pain medications without talking with your primary care practitioner   Special Instructions: If you have smoked or chewed Tobacco  in the last 2 yrs please stop smoking, stop any regular Alcohol  and or any Recreational drug use.   Wear Seat belts while driving.   Please note: You were cared for by a hospitalist during your hospital stay. Once you are discharged, your primary care physician will handle any further medical issues. Please note that NO REFILLS for any discharge medications will be authorized once you are discharged, as it is imperative that you return to your primary care physician (or  establish a relationship with a primary care physician if you do not have one) for your post hospital discharge needs so that they can reassess your need for medications and monitor your lab values.  Time coordinating discharge: 40 minutes  SIGNED:  Marzetta Board, MD, PhD 12/06/2021, 11:09 AM

## 2021-12-10 ENCOUNTER — Other Ambulatory Visit (HOSPITAL_COMMUNITY): Payer: Self-pay

## 2021-12-10 ENCOUNTER — Telehealth (HOSPITAL_COMMUNITY): Payer: Self-pay

## 2021-12-10 NOTE — Telephone Encounter (Signed)
Pharmacy Transitions of Care Follow-up Telephone Call  Date of discharge: 12/06/21  Discharge Diagnosis: DVT/PE  How have you been since you were released from the hospital? He has been doing very well since his hospital discharge. He reports improved energy, however, he is not back to his baseline. He was able to verbalized s/sx to watch for with bleeding with his Eliquis.  Medication changes made at discharge: START taking these medications  START taking these medications  Eliquis DVT/PE Starter Pack Generic drug: Apixaban Starter Pack ('10mg'$  and '5mg'$ ) Take as directed on package: start with two-'5mg'$  tablets twice daily for 7 days. On day 8, switch to one-'5mg'$  tablet twice daily. Replaces: Eliquis 5 MG Tabs tablet   CHANGE how you take these medications  CHANGE how you take these medications  buPROPion 300 MG 24 hr tablet Commonly known as: WELLBUTRIN XL What changed: Another medication with the same name was removed. Continue taking this medication, and follow the directions you see here.   CONTINUE taking these medications  CONTINUE taking these medications  amLODipine 10 MG tablet Commonly known as: NORVASC  anastrozole 1 MG tablet Commonly known as: ARIMIDEX  atorvastatin 20 MG tablet Commonly known as: LIPITOR  baclofen 10 MG tablet Commonly known as: LIORESAL  celecoxib 200 MG capsule Commonly known as: CELEBREX  EPINEPHrine 0.3 mg/0.3 mL Soaj injection Commonly known as: EPI-PEN  FISH OIL PO  fluorouracil 5 % cream Commonly known as: EFUDEX  HYDROcodone-acetaminophen 5-325 MG tablet Commonly known as: NORCO/VICODIN  irbesartan 300 MG tablet Commonly known as: AVAPRO  MAGNESIUM MALATE PO  omeprazole 40 MG capsule Commonly known as: PRILOSEC  tamsulosin 0.4 MG Caps capsule Commonly known as: FLOMAX  TESTOSTERONE IM  Trintellix 10 MG Tabs tablet Generic drug: vortioxetine HBr  valACYclovir 500 MG tablet Commonly known as: VALTREX   STOP taking these  medications  STOP taking these medications  aspirin EC 81 MG tablet  Eliquis 5 MG Tabs tablet Generic drug: apixaban Replaced by: Eliquis DVT/PE Starter Pack  metoprolol tartrate 100 MG tablet Commonly known as: LOPRESSOR    Medication changes verified by the patient? yes     Medication Accessibility:  Home Pharmacy: [facility]   Was the patient provided with refills on discharged medications? no     Is the patient interested in using a [place] pharmacy? no  Medication Review: APIXABAN (ELIQUIS)  Apixaban 10 mg BID initiated on 9/29 for DVT/PE. Will switch to apixaban 5 mg BID after 7 days (DATE 10/6).  - Discussed importance of taking medication around the same time every day.  - Advised patient of medications to avoid (NSAIDs, ASA maintenance doses>100 mg daily)  - Educated that Tylenol (acetaminophen) will be the preferred analgesic to prevent risk of bleeding. - Emphasized importance of monitoring for signs and symptoms of bleeding (abnormal bruising, prolonged bleeding, nose bleeds, bleeding from gums, discolored urine, black tarry stools)  - Advised patient to alert all providers of anticoagulation therapy prior to starting a new medication or having a procedure.  Follow-up Appointments:  -PCP appointment scheduled for tomorrow. Encouraged patient to request a refill for his Eliquis at that time  Maryan Puls, PharmD PGY-1 [facility] Pharmacy Resident

## 2021-12-11 DIAGNOSIS — D6869 Other thrombophilia: Secondary | ICD-10-CM | POA: Diagnosis not present

## 2021-12-11 DIAGNOSIS — I499 Cardiac arrhythmia, unspecified: Secondary | ICD-10-CM | POA: Diagnosis not present

## 2021-12-11 DIAGNOSIS — Z23 Encounter for immunization: Secondary | ICD-10-CM | POA: Diagnosis not present

## 2021-12-11 DIAGNOSIS — E298 Other testicular dysfunction: Secondary | ICD-10-CM | POA: Diagnosis not present

## 2021-12-11 DIAGNOSIS — I82401 Acute embolism and thrombosis of unspecified deep veins of right lower extremity: Secondary | ICD-10-CM | POA: Diagnosis not present

## 2021-12-11 DIAGNOSIS — I2692 Saddle embolus of pulmonary artery without acute cor pulmonale: Secondary | ICD-10-CM | POA: Diagnosis not present

## 2021-12-17 ENCOUNTER — Telehealth: Payer: Self-pay | Admitting: Cardiology

## 2021-12-17 NOTE — Telephone Encounter (Signed)
Patient calling to get the results from his monitor he wore. Please advise

## 2021-12-17 NOTE — Telephone Encounter (Signed)
See result note.  

## 2021-12-17 NOTE — Telephone Encounter (Signed)
Patient stated he wanted results of heart monitor. Informed patient that Dr. Gardiner Rhyme  will make his notation, then our clinic will contact patient with results. Patient verbalized understanding.

## 2021-12-18 ENCOUNTER — Other Ambulatory Visit: Payer: Self-pay

## 2021-12-18 DIAGNOSIS — M7062 Trochanteric bursitis, left hip: Secondary | ICD-10-CM | POA: Diagnosis not present

## 2021-12-18 DIAGNOSIS — M7061 Trochanteric bursitis, right hip: Secondary | ICD-10-CM | POA: Diagnosis not present

## 2021-12-18 MED ORDER — METOPROLOL TARTRATE 100 MG PO TABS: ORAL_TABLET | ORAL | 0 refills | Status: DC

## 2021-12-18 NOTE — Telephone Encounter (Signed)
Donato Heinz, MD  12/17/2021  8:56 PM EDT     Frequent extra beats from top chamber of heart, recommend starting metoprolol 25 mg twice daily    Left message to call back

## 2021-12-24 NOTE — Telephone Encounter (Signed)
Patient aware of results and recommendations.  He states he has been seeing Dr. Lovena Le as well and would like these results forwarded to him for review and recommendations.    Forwarded to Dr. Lovena Le to review.

## 2021-12-27 DIAGNOSIS — H353221 Exudative age-related macular degeneration, left eye, with active choroidal neovascularization: Secondary | ICD-10-CM | POA: Diagnosis not present

## 2021-12-27 DIAGNOSIS — H35413 Lattice degeneration of retina, bilateral: Secondary | ICD-10-CM | POA: Diagnosis not present

## 2021-12-27 DIAGNOSIS — H2513 Age-related nuclear cataract, bilateral: Secondary | ICD-10-CM | POA: Diagnosis not present

## 2021-12-30 ENCOUNTER — Telehealth (HOSPITAL_COMMUNITY): Payer: Self-pay | Admitting: *Deleted

## 2021-12-30 NOTE — Telephone Encounter (Signed)
Attempted to call patient regarding upcoming cardiac CT appointment. Patient's wife answered phone and left message with name and call back number with her for the patient to call back.  Gordy Clement RN Navigator Cardiac Imaging [facility] [phone] Office [phone] Cell

## 2021-12-31 ENCOUNTER — Ambulatory Visit (HOSPITAL_COMMUNITY)
Admission: RE | Admit: 2021-12-31 | Discharge: 2021-12-31 | Disposition: A | Discharge status: Home / Self Care | Payer: Medicare Other | Source: Ambulatory Visit | Attending: Internal Medicine | Admitting: Internal Medicine

## 2021-12-31 DIAGNOSIS — R002 Palpitations: Secondary | ICD-10-CM | POA: Diagnosis not present

## 2021-12-31 DIAGNOSIS — I482 Chronic atrial fibrillation, unspecified: Secondary | ICD-10-CM | POA: Insufficient documentation

## 2021-12-31 DIAGNOSIS — I2692 Saddle embolus of pulmonary artery without acute cor pulmonale: Secondary | ICD-10-CM | POA: Diagnosis not present

## 2021-12-31 DIAGNOSIS — I10 Essential (primary) hypertension: Secondary | ICD-10-CM | POA: Diagnosis not present

## 2021-12-31 MED ORDER — NITROGLYCERIN 0.4 MG SL SUBL
SUBLINGUAL_TABLET | SUBLINGUAL | Status: AC
  Filled 2021-12-31: qty 2

## 2021-12-31 MED ORDER — IOHEXOL 350 MG/ML SOLN
100.0000 mL | Freq: Once | INTRAVENOUS | Status: AC | PRN
  Administered 2021-12-31: 100 mL via INTRAVENOUS

## 2021-12-31 MED ORDER — NITROGLYCERIN 0.4 MG SL SUBL
0.8000 mg | SUBLINGUAL_TABLET | Freq: Once | SUBLINGUAL | Status: AC
  Administered 2021-12-31: 0.8 mg via SUBLINGUAL

## 2022-01-02 NOTE — Telephone Encounter (Signed)
I have reviewed the heart monitor. It shows that he has frequent premature beats coming from the top chamber of his heart and very rare premature beats from the bottom chamber. There is no atrial fib. There are medications which can be used to treat the premature beats but also very small risk associated with the treatment. GT

## 2022-01-03 NOTE — Telephone Encounter (Signed)
Pt called and shared Dr. Tanna Furry message regarding Heart Monitor results with Pt.  Pt wanted to know if his Coronary CTA was reviewed yet?  Pt told test resulted 12/31/21, will bring to Dr. Tanna Furry attention.    Pt stated 1. Wants Dr. Lovena Le to know he is regularly dizzy once or twice daily.    2. Would like Dr. Tanna Furry opinion what the Zio Monitor and Coronary CTA mean together, regarding his plan of care.    Will forward questions to Dr. Lovena Le.

## 2022-01-04 NOTE — Telephone Encounter (Signed)
The coronary CT is essentially normal. His dizzy spells could be due to his arrhythmias. I would like to see him back in November to better understand the dizziness tryand discuss treatment options. GT

## 2022-01-06 NOTE — Telephone Encounter (Signed)
Pt is scheduled to see Dr. Lovena Le 01/23/22.

## 2022-01-13 DIAGNOSIS — R972 Elevated prostate specific antigen [PSA]: Secondary | ICD-10-CM | POA: Diagnosis not present

## 2022-01-22 DIAGNOSIS — R972 Elevated prostate specific antigen [PSA]: Secondary | ICD-10-CM | POA: Diagnosis not present

## 2022-01-22 DIAGNOSIS — M7061 Trochanteric bursitis, right hip: Secondary | ICD-10-CM | POA: Diagnosis not present

## 2022-01-22 DIAGNOSIS — M7062 Trochanteric bursitis, left hip: Secondary | ICD-10-CM | POA: Diagnosis not present

## 2022-01-22 DIAGNOSIS — R32 Unspecified urinary incontinence: Secondary | ICD-10-CM | POA: Diagnosis not present

## 2022-01-22 DIAGNOSIS — N5201 Erectile dysfunction due to arterial insufficiency: Secondary | ICD-10-CM | POA: Diagnosis not present

## 2022-01-23 ENCOUNTER — Ambulatory Visit: Payer: Medicare Other | Attending: Internal Medicine | Admitting: Internal Medicine

## 2022-01-23 ENCOUNTER — Encounter: Payer: Self-pay | Admitting: Internal Medicine

## 2022-01-23 VITALS — BP 122/68 | HR 89 | Ht 67.0 in | Wt 185.0 lb

## 2022-01-23 DIAGNOSIS — R002 Palpitations: Secondary | ICD-10-CM | POA: Insufficient documentation

## 2022-01-23 DIAGNOSIS — I10 Essential (primary) hypertension: Secondary | ICD-10-CM | POA: Diagnosis not present

## 2022-01-23 MED ORDER — FUROSEMIDE 20 MG PO TABS: 20.0000 mg | ORAL_TABLET | Freq: Every day | ORAL | 3 refills | Status: AC | PRN

## 2022-01-23 MED ORDER — METOPROLOL SUCCINATE ER 50 MG PO TB24: 25.0000 mg | ORAL_TABLET | Freq: Every day | ORAL | 2 refills | Status: DC

## 2022-01-23 NOTE — Progress Notes (Signed)
HPI [name] returns today for followup. He is a pleasant 71 yo man with a h/o HTN, who was found to have PAC's and then had a DVT and a pulmonary embolism after arm surgery. He has been placed on eliquis. He has had problems with dizziness and light headedness as well as some swelling in his ankles as well as sob though this has improved.  He has not had chest pain.  Allergies  Allergen Reactions   Vancomycin Other (See Comments)    Hypotension    Hydrocodone Other (See Comments)    "jumps out of skin"   Penicillins Other (See Comments)    Unknown childhood reaction  Has patient had aCN reaction causing immediate rash, facial/tongue/throat swelling, SOB or lightheadedness with hypotension: No Has patient had a PCN reaction causing severe rash involving mucus membranes or skin necrosis: No Has patient had a PCN reaction that required hospitalization No Has patient had a PCN reaction occurring within the last 10 years: No If all of the above answers are "NO", then may proceed with Cephalosporin use.   Percocet [Oxycodone-Acetaminophen] Other (See Comments)    "jumps out of skin"     Current Outpatient Medications  Medication Sig Dispense Refill   amLODipine (NORVASC) 10 MG tablet Take 10 mg by mouth daily in the afternoon.     anastrozole (ARIMIDEX) 1 MG tablet Take 1 mg by mouth daily.     APIXABAN (ELIQUIS) VTE STARTER PACK ('10MG'$  AND '5MG'$ ) Take as directed on package: start with two-'5mg'$  tablets twice daily for 7 days. On day 8, switch to one-'5mg'$  tablet twice daily. 74 each 0   atorvastatin (LIPITOR) 20 MG tablet Take 20 mg by mouth every morning.      baclofen (LIORESAL) 10 MG tablet Take 1 tablet by mouth 3 (three) times daily as needed.     buPROPion (WELLBUTRIN XL) 300 MG 24 hr tablet Take 300 mg by mouth every morning.      EPINEPHrine 0.3 mg/0.3 mL IJ SOAJ injection      fluorouracil (EFUDEX) 5 % cream Apply topically.     HYDROcodone-acetaminophen (NORCO/VICODIN) 5-325  MG tablet Take 1 tablet by mouth every 6 (six) hours as needed.     irbesartan (AVAPRO) 300 MG tablet Take 300 mg by mouth every morning.      MAGNESIUM MALATE PO Take 1,350 mg by mouth in the morning, at noon, and at bedtime.     Omega-3 Fatty Acids (FISH OIL PO) Take 2 tablets by mouth daily.     omeprazole (PRILOSEC) 40 MG capsule Take 40 mg by mouth every morning.      tamsulosin (FLOMAX) 0.4 MG CAPS capsule Take 0.4 mg by mouth daily.     TESTOSTERONE IM Inject 1.5 mLs into the muscle every 14 (fourteen) days.     TRINTELLIX 10 MG TABS tablet Take 10 mg by mouth daily.     valACYclovir (VALTREX) 500 MG tablet Take 500 mg by mouth as needed (for flares).     No current facility-administered medications for this visit.     Past Medical History:  Diagnosis Date   Depression    DJD (degenerative joint disease)    ED (erectile dysfunction) of organic origin    GERD (gastroesophageal reflux disease)    Hemorrhoids    History of adenomatous polyp of colon    12-11-2014  tubular adenoam, hyperplastic   Hyperlipidemia    Hypertension     ROS:  All systems reviewed and negative except as noted in the HPI.   Past Surgical History:  Procedure Laterality Date   ACHILLES TENDON REPAIR Right 11/2009   ruptured   ANTERIOR CERVICAL DECOMP/DISCECTOMY FUSION  04/23/2009   C7 -- T1   COLONOSCOPY WITH PROPOFOL N/A 12/11/2014   Procedure: COLONOSCOPY WITH PROPOFOL;  Surgeon: Garlan Fair, MD;  Location: [facility];  Service: Endoscopy;  Laterality: N/A;   FINGER ARTHRODESIS Right 02/06/2017   Procedure: ARTHRODESIS RIGHT INDEX FINGER DISTAL INTERPHALANGEAL JOINT;  Surgeon: Leanora Cover, MD;  Location: [place];  Service: Orthopedics;  Laterality: Right;   INGUINAL HERNIA REPAIR Bilateral left 03/18/2002/  right -- yrs ago   [place] N/A 04/03/2016   Procedure: PENILE PROTHESIS INFLATABLE;  Surgeon: Franchot Gallo, MD;  Location: [facility];  Service: Urology;  Laterality: N/A;   SHOULDER ARTHROSCOPY W/ ROTATOR CUFF REPAIR Right 2002 and 2012   SHOULDER ARTHROSCOPY WITH ROTATOR CUFF REPAIR AND SUBACROMIAL DECOMPRESSION Right 05/06/2012   Procedure: SHOULDER ARTHROSCOPY WITH ROTATOR CUFF REPAIR AND SUBACROMIAL DECOMPRESSION;  Surgeon: Ninetta Lights, MD;  Location: [place];  Service: Orthopedics;  Laterality: Right;  RIGHT SHOULDER ARTHROSCOPY  lysis of adhesions rotator cuff repair    SHOULDER ARTHROSCOPY/  DEBRIDEMENT LABRUM AND ROTATOR CUFF PARTIAL TEAR/  RESECTION BURSA/  ACROMIOPLASTY/  CORACOACROMINAL LIGAMENT RELEASE/  DCR Left 12/17/2001   SOFT TISSUE MASS EXCISION  06/25/2006   right forearm-- angiolipoma   TONSILLECTOMY AND ADENOIDECTOMY  child   TOTAL SHOULDER ARTHROPLASTY Right 02/04/2016     Family History  Problem Relation Age of Onset   Parkinsonism Father    Colon cancer Neg Hx    Esophageal cancer Neg Hx    Rectal cancer Neg Hx    Stomach cancer Neg Hx      Social History   Socioeconomic History   Marital status: Married    Spouse name: Not on file   Number of children: Not on file   Years of education: Not on file   Highest education level: Not on file  Occupational History   Not on file  Tobacco Use   Smoking status: Never   Smokeless tobacco: Never  Vaping Use   Vaping Use: Never used  Substance and Sexual Activity   Alcohol use: Yes    Comment: daily wine, 2-3 per day, no h/o w/d   Drug use: No   Sexual activity: Not on file  Other Topics Concern   Not on file  Social History Narrative   Not on file   Social Determinants of Health   Financial Resource Strain: Not on file  Food Insecurity: Not on file  Transportation Needs: Not on file  Physical Activity: Not on file  Stress: Not on file  Social Connections: Not on file  Intimate Partner Violence: Not on file     BP 122/68   Pulse 89   Ht '5\' 7"'$  (1.702 m)   Wt 185 lb (83.9 kg)   SpO2 93%   BMI  28.98 kg/m   Physical Exam:  Well appearing NAD HEENT: Unremarkable Neck:  No JVD, no thyromegally Lymphatics:  No adenopathy Back:  No CVA tenderness Lungs:  Clear with no wheezes HEART:  Regular rate rhythm, no murmurs, no rubs, no clicks Abd:  soft, positive bowel sounds, no organomegally, no rebound, no guarding Ext:  2 plus pulses, no edema, no cyanosis, no clubbing Skin:  No rashes no nodules Neuro:  CN  II through XII intact, motor grossly intact  EKG- NSR with bigeminal PAC's  DEVICE  Normal device function.  See PaceArt for details.   Assess/Plan: PAC's - we discussed the treatment options. It appears that he is symptomatic. I will have him try low dose metoprolol. Dyspnea/lower extremity swelling - I prescribed him low dose lasix.  PE - he will continue eliquis. 4. HTN - his bp is well controlled. No change.  Carleene Overlie Brithney Bensen,MD

## 2022-01-23 NOTE — Patient Instructions (Addendum)
Medication Instructions:  Your physician has recommended you make the following change in your medication: START TAKING:  LASIX 20 MG,  AND TOPROL XL 50 MG.  Lasix 20 MG:  Take 1 tablet (20 mg total) by mouth daily as needed. Only Take AS NEEDED   Toprol XL 50 MG:  Take 0.5 tablets (25 mg total) by mouth daily. May INCREASE DOSE with palpitations to: Take 1 tablet ( 50 mg total ) by mouth daily, ONLY as needed; Take with or immediately following a meal.   Lab Work: None ordered.  If you have labs (blood work) drawn today and your tests are completely normal, you will receive your results only by: [place] (if you have MyChart) OR A paper copy in the mail If you have any lab test that is abnormal or we need to change your treatment, we will call you to review the results.  Testing/Procedures: None ordered.  Follow-Up: At [facility], you and your health needs are our priority.  As part of our continuing mission to provide you with exceptional heart care, we have created designated Provider Care Teams.  These Care Teams include your primary Cardiologist (physician) and Advanced Practice Providers (APPs -  Physician Assistants and Nurse Practitioners) who all work together to provide you with the care you need, when you need it.  We recommend signing up for the patient portal called "MyChart".  Sign up information is provided on this After Visit Summary.  MyChart is used to connect with patients for Virtual Visits (Telemedicine).  Patients are able to view lab/test results, encounter notes, upcoming appointments, etc.  Non-urgent messages can be sent to your provider as well.   To learn more about what you can do with MyChart, go to [email or web address].    Your next appointment:   You will follow up with Dr Lovena Le in 3 months for a follow up appointment.   The format for your next appointment:   In Person  Provider:   Cristopher Peru, MD{or one of the following Advanced  Practice Providers on your designated Care Team:   Tommye Standard, [place] Legrand Como "Jonni Sanger" Chalmers Cater, [place]   Important Information About Sugar

## 2022-01-28 DIAGNOSIS — E291 Testicular hypofunction: Secondary | ICD-10-CM | POA: Diagnosis not present

## 2022-02-11 DIAGNOSIS — H353221 Exudative age-related macular degeneration, left eye, with active choroidal neovascularization: Secondary | ICD-10-CM | POA: Diagnosis not present

## 2022-02-11 DIAGNOSIS — H35413 Lattice degeneration of retina, bilateral: Secondary | ICD-10-CM | POA: Diagnosis not present

## 2022-02-11 DIAGNOSIS — H2513 Age-related nuclear cataract, bilateral: Secondary | ICD-10-CM | POA: Diagnosis not present

## 2022-02-12 DIAGNOSIS — M25552 Pain in left hip: Secondary | ICD-10-CM | POA: Diagnosis not present

## 2022-02-18 ENCOUNTER — Ambulatory Visit: Payer: Medicare Other | Admitting: General Practice

## 2022-02-19 DIAGNOSIS — M7060 Trochanteric bursitis, unspecified hip: Secondary | ICD-10-CM | POA: Diagnosis not present

## 2022-02-19 DIAGNOSIS — M25552 Pain in left hip: Secondary | ICD-10-CM | POA: Diagnosis not present

## 2022-02-19 DIAGNOSIS — M25551 Pain in right hip: Secondary | ICD-10-CM | POA: Diagnosis not present

## 2022-02-24 DIAGNOSIS — H40023 Open angle with borderline findings, high risk, bilateral: Secondary | ICD-10-CM | POA: Diagnosis not present

## 2022-02-24 DIAGNOSIS — H18893 Other specified disorders of cornea, bilateral: Secondary | ICD-10-CM | POA: Diagnosis not present

## 2022-02-24 DIAGNOSIS — H5211 Myopia, right eye: Secondary | ICD-10-CM | POA: Diagnosis not present

## 2022-02-24 DIAGNOSIS — H52221 Regular astigmatism, right eye: Secondary | ICD-10-CM | POA: Diagnosis not present

## 2022-02-24 DIAGNOSIS — H40053 Ocular hypertension, bilateral: Secondary | ICD-10-CM | POA: Diagnosis not present

## 2022-03-06 ENCOUNTER — Encounter: Payer: Self-pay | Admitting: Internal Medicine

## 2022-03-24 DIAGNOSIS — Z85828 Personal history of other malignant neoplasm of skin: Secondary | ICD-10-CM | POA: Diagnosis not present

## 2022-03-24 DIAGNOSIS — L821 Other seborrheic keratosis: Secondary | ICD-10-CM | POA: Diagnosis not present

## 2022-03-28 IMAGING — CT CT CERVICAL SPINE W/ CM
2 series · 10 of 14 positions shown, 12 images · IV contrast (omnipaque)
Comparison: none

CLINICAL DATA: Left hand muscular atrophy

EXAM:
CERVICAL MYELOGRAM
CT CERVICAL SPINE WITH INTRATHECAL CONTRAST
TECHNIQUE: An appropriate entry site was determined under fluoroscopy. Operator
donned sterile gloves and mask. Skin site was marked,prepped with
Betadine, and draped in usual sterile fashion, and infiltrated
locally with 1% lidocaine. A 22 gauge spinal needle was advanced
into the thecal sac at L4 from a left parasagittal approach. Clear
colorless CSF returned. 10 ml Omnipaque 300 were administered
intrathecally for cervical myelography, followed by axial CT
scanning of the cervical spine. I personally performed the lumbar
puncture and administered the intrathecal contrast. I also
personally supervised acquisition of the myelogram images. Coronal
and sagittal reconstructions were generated.

[Series 2: cspine soft · axial · 0.39mm/px · z∈[+1012,+1146]mm · 5 of 101 slices shown]
[im 17/101  soft-tissue]
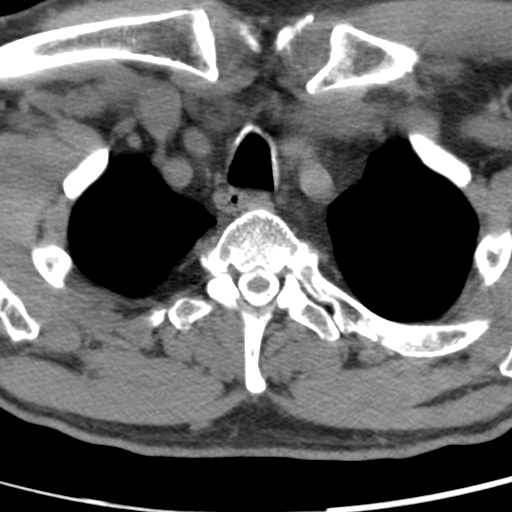
[im 34/101  soft-tissue]
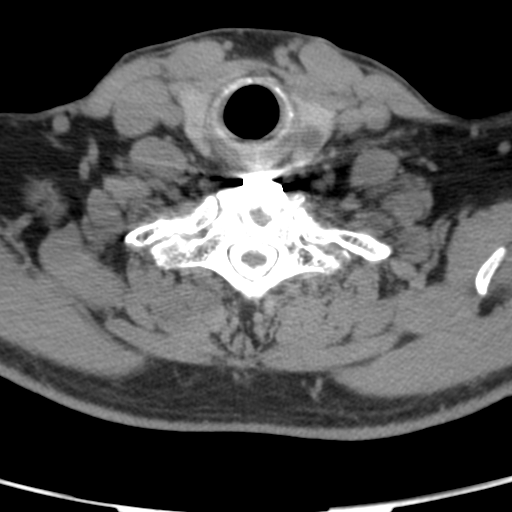
[im 51/101  soft-tissue]
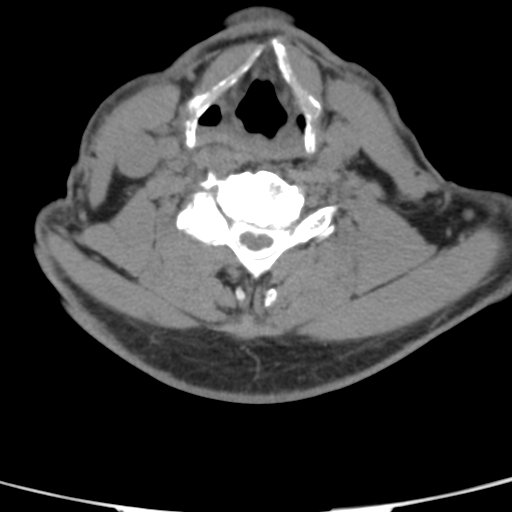
[im 67/101  soft-tissue]
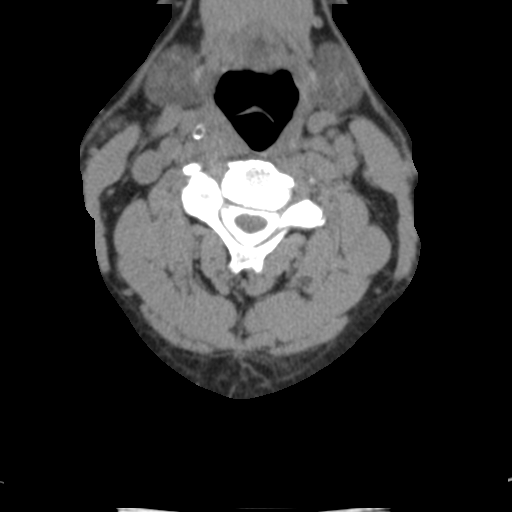
[im 84/101  soft-tissue]
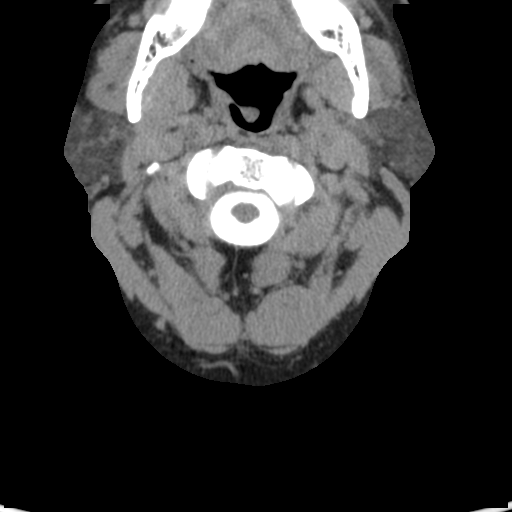

[Series 8: angled axial soft · axial · 0.29mm/px · z∈[+996,+1130]mm · 5 of 102 slices shown, 7 images]
[im 17/102  soft-tissue]
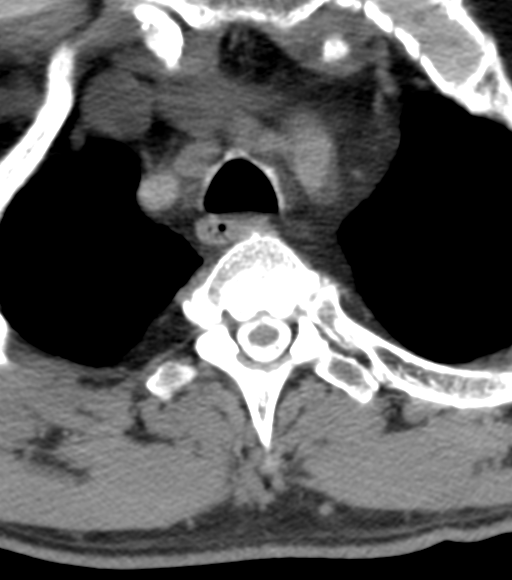
[im 17/102  bone]
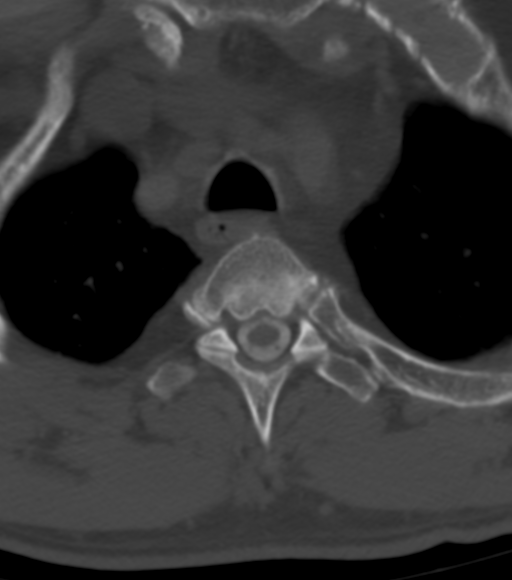
[im 34/102  bone]
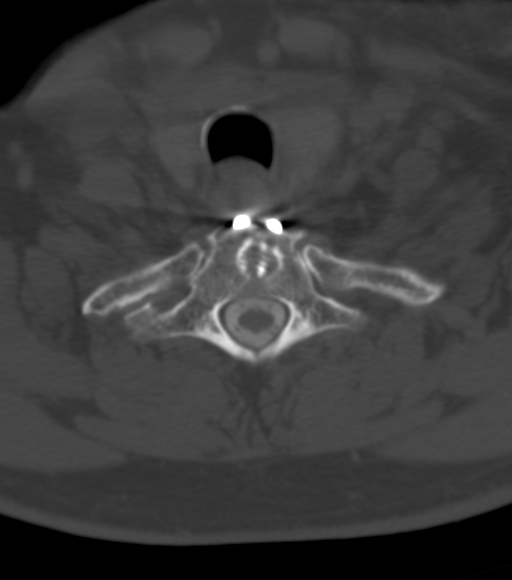
[im 51/102  bone]
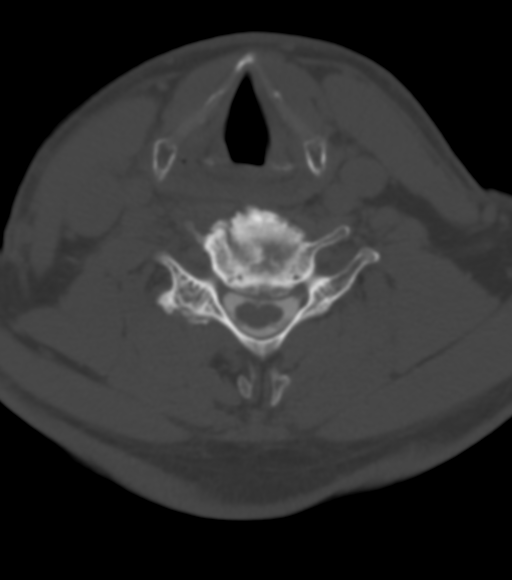
[im 68/102  bone]
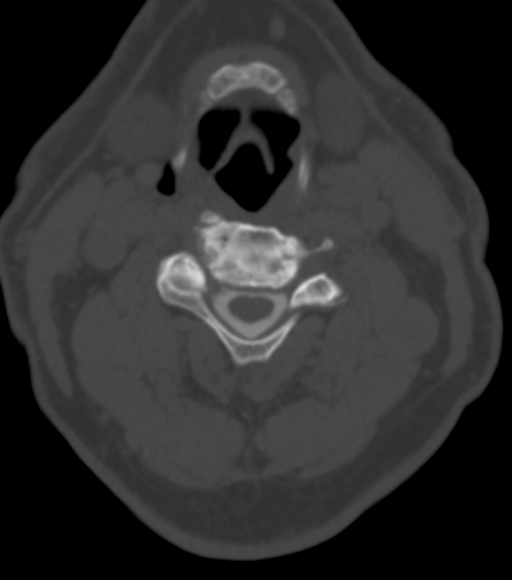
[im 85/102  soft-tissue]
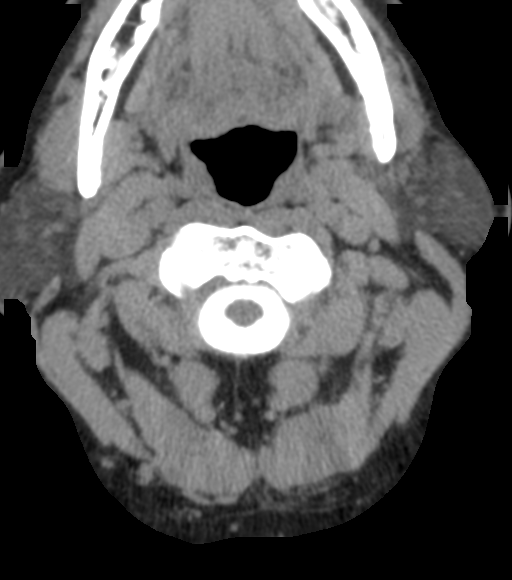
[im 85/102  bone]
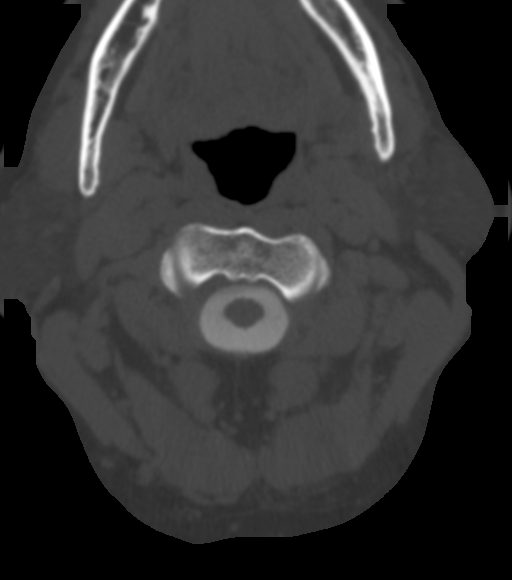

[10 of 14 positions shown; findings below may reference images not displayed]

RADIATION DOSE REDUCTION: This exam was performed according to the
departmental dose-optimization program which includes automated
exposure control, adjustment of the mA and/or kV according to
patient size and/or use of iterative reconstruction technique

FLUOROSCOPY:
Radiation Exposure Index (as provided by the fluoroscopic device):
11.1 mGy Kerma
FINDINGS: Straightening of the normal cervical lordosis. Negative for fracture
or focal bone lesion.

C2-3: 2 mm anterolisthesis, stable since 01/01/2017. Minimal left
parasagittal disc bulge. No cord contact, distortion, or spinal
stenosis. Facet DJD right greater than left with right foraminal
encroachment.

C3-4: Moderate narrowing of the interspace. Circumferential disc
bulge with osteophytic ridging. Uncovertebral spurs result in
foraminal encroachment, right worse than left. No spinal stenosis.

C4-5: Moderate narrowing of the interspace. Circumferential disc
bulge with osteophytic ridging. Mild distortion of the anterior
cord. Borderline spinal stenosis. Right facet and uncovertebral
spurring result in right foraminal stenosis.

C5-6: Moderate narrowing of the interspace. Broad mild left
posterolateral disc bulge with osteophytic ridging. No spinal
stenosis. Uncovertebral and facet spurring result in foraminal
encroachment the right worse than left.

C6-7: Moderate narrowing of the interspace. Broad left paracentral
disc bulge with osteophytic spurring. Mild anterior cord distortion.
Borderline spinal stenosis. Foraminal stenosis left greater than
right secondary to uncovertebral spurring.

C7-T1: Solid-appearing instrumented ACDF. Central canal capacious.
Thickening left C8 nerve roots. Foramina patent.

Visualized upper thoracic spine unremarkable. No prevertebral soft
tissue swelling or fluid. Ectatic proximal descending thoracic
aorta, incompletely visualized. Visualized portions of lung apices
clear. Remainder visualized paraspinal soft tissues unremarkable.
IMPRESSION: 1. Facet DJD C2-3, with stable 2 mm anterolisthesis no, no
compressive pathology.
2. Foraminal encroachment C3-4 secondary to uncovertebral spurring,
right greater than left.
3. Right foraminal stenosis C4-5 secondary to facet and
uncovertebral disease.
4. Foraminal encroachment C5-6, right greater than left, secondary
to uncovertebral and facet spurs.
5. Left foraminal stenosis C6-7 secondary to disc bulge and
uncovertebral spurring.
6. Solid-appearing ACDF C7-T1.

## 2022-04-07 DIAGNOSIS — I517 Cardiomegaly: Secondary | ICD-10-CM | POA: Diagnosis not present

## 2022-04-07 DIAGNOSIS — R008 Other abnormalities of heart beat: Secondary | ICD-10-CM | POA: Diagnosis not present

## 2022-04-07 DIAGNOSIS — R9431 Abnormal electrocardiogram [ECG] [EKG]: Secondary | ICD-10-CM | POA: Diagnosis not present

## 2022-04-08 DIAGNOSIS — R008 Other abnormalities of heart beat: Secondary | ICD-10-CM | POA: Diagnosis not present

## 2022-04-08 DIAGNOSIS — R5383 Other fatigue: Secondary | ICD-10-CM | POA: Diagnosis not present

## 2022-04-29 ENCOUNTER — Ambulatory Visit: Payer: Medicare Other | Attending: Internal Medicine | Admitting: Internal Medicine

## 2022-04-29 ENCOUNTER — Encounter: Payer: Self-pay | Admitting: Internal Medicine

## 2022-04-29 VITALS — BP 118/70 | HR 80 | Ht 67.0 in | Wt 179.6 lb

## 2022-04-29 DIAGNOSIS — I10 Essential (primary) hypertension: Secondary | ICD-10-CM | POA: Diagnosis not present

## 2022-04-29 DIAGNOSIS — R002 Palpitations: Secondary | ICD-10-CM

## 2022-04-29 DIAGNOSIS — I491 Atrial premature depolarization: Secondary | ICD-10-CM | POA: Diagnosis not present

## 2022-04-29 NOTE — Progress Notes (Signed)
HPI Jacob [name] returns today for followup. He is a pleasant 72 yo man with a h/o HTN, who was found to have PAC's and then had a DVT and a pulmonary embolism after arm surgery. He has been placed on eliquis. He has had problems with dizziness and light headedness as well as some swelling in his ankles as well as sob though this has improved. He has not had chest pain.   When I saw him last we started him on low dose toprol and lasix for palpitations and sob. In the interim he notes that he felt poorly on the toprol. He has taken lasix only occasionally. He notes that since he has been on vacation he has not had as much sob.  Allergies  Allergen Reactions   Vancomycin Other (See Comments)    Hypotension    Hydrocodone Other (See Comments)    "jumps out of skin"   Penicillins Other (See Comments)    Unknown childhood reaction  Has patient had aCN reaction causing immediate rash, facial/tongue/throat swelling, SOB or lightheadedness with hypotension: No Has patient had a PCN reaction causing severe rash involving mucus membranes or skin necrosis: No Has patient had a PCN reaction that required hospitalization No Has patient had a PCN reaction occurring within the last 10 years: No If all of the above answers are "NO", then may proceed with Cephalosporin use.   Percocet [Oxycodone-Acetaminophen] Other (See Comments)    "jumps out of skin"     Current Outpatient Medications  Medication Sig Dispense Refill   amLODipine (NORVASC) 10 MG tablet Take 10 mg by mouth daily in the afternoon.     anastrozole (ARIMIDEX) 1 MG tablet Take 1 mg by mouth daily.     APIXABAN (ELIQUIS) VTE STARTER PACK (10MG AND 5MG) Take as directed on package: start with two-58m tablets twice daily for 7 days. On day 8, switch to one-570mtablet twice daily. 74 each 0   atorvastatin (LIPITOR) 20 MG tablet Take 20 mg by mouth every morning.      baclofen (LIORESAL) 10 MG tablet Take 1 tablet by mouth 3 (three) times  daily as needed.     buPROPion (WELLBUTRIN XL) 300 MG 24 hr tablet Take 300 mg by mouth every morning.      EPINEPHrine 0.3 mg/0.3 mL IJ SOAJ injection      fluorouracil (EFUDEX) 5 % cream Apply topically.     furosemide (LASIX) 20 MG tablet Take 1 tablet (20 mg total) by mouth daily as needed. Only Take AS NEEDED. 90 tablet 3   irbesartan (AVAPRO) 300 MG tablet Take 300 mg by mouth every morning.      MAGNESIUM MALATE PO Take 1,350 mg by mouth in the morning, at noon, and at bedtime.     Omega-3 Fatty Acids (FISH OIL PO) Take 2 tablets by mouth daily.     omeprazole (PRILOSEC) 40 MG capsule Take 40 mg by mouth every morning.      tamsulosin (FLOMAX) 0.4 MG CAPS capsule Take 0.4 mg by mouth daily.     TESTOSTERONE IM Inject 1.5 mLs into the muscle every 14 (fourteen) days.     TRINTELLIX 10 MG TABS tablet Take 10 mg by mouth daily.     valACYclovir (VALTREX) 500 MG tablet Take 500 mg by mouth as needed (for flares).     No current facility-administered medications for this visit.     Past Medical History:  Diagnosis Date   Depression  DJD (degenerative joint disease)    ED (erectile dysfunction) of organic origin    GERD (gastroesophageal reflux disease)    Hemorrhoids    History of adenomatous polyp of colon    12-11-2014  tubular adenoam, hyperplastic   Hyperlipidemia    Hypertension     ROS:   All systems reviewed and negative except as noted in the HPI.   Past Surgical History:  Procedure Laterality Date   ACHILLES TENDON REPAIR Right 11/2009   ruptured   ANTERIOR CERVICAL DECOMP/DISCECTOMY FUSION  04/23/2009   C7 -- T1   COLONOSCOPY WITH PROPOFOL N/A 12/11/2014   Procedure: COLONOSCOPY WITH PROPOFOL;  Surgeon: Garlan Fair, MD;  Location: [facility];  Service: Endoscopy;  Laterality: N/A;   FINGER ARTHRODESIS Right 02/06/2017   Procedure: ARTHRODESIS RIGHT INDEX FINGER DISTAL INTERPHALANGEAL JOINT;  Surgeon: Leanora Cover, MD;  Location: [facility];  Service: Orthopedics;  Laterality: Right;   INGUINAL HERNIA REPAIR Bilateral left 03/18/2002/  right -- yrs ago   [place] N/A 04/03/2016   Procedure: PENILE PROTHESIS INFLATABLE;  Surgeon: Franchot Gallo, MD;  Location: [facility];  Service: Urology;  Laterality: N/A;   SHOULDER ARTHROSCOPY W/ ROTATOR CUFF REPAIR Right 2002 and 2012   SHOULDER ARTHROSCOPY WITH ROTATOR CUFF REPAIR AND SUBACROMIAL DECOMPRESSION Right 05/06/2012   Procedure: SHOULDER ARTHROSCOPY WITH ROTATOR CUFF REPAIR AND SUBACROMIAL DECOMPRESSION;  Surgeon: Ninetta Lights, MD;  Location: [facility];  Service: Orthopedics;  Laterality: Right;  RIGHT SHOULDER ARTHROSCOPY  lysis of adhesions rotator cuff repair    SHOULDER ARTHROSCOPY/  DEBRIDEMENT LABRUM AND ROTATOR CUFF PARTIAL TEAR/  RESECTION BURSA/  ACROMIOPLASTY/  CORACOACROMINAL LIGAMENT RELEASE/  DCR Left 12/17/2001   SOFT TISSUE MASS EXCISION  06/25/2006   right forearm-- angiolipoma   TONSILLECTOMY AND ADENOIDECTOMY  child   TOTAL SHOULDER ARTHROPLASTY Right 02/04/2016     Family History  Problem Relation Age of Onset   Parkinsonism Father    Colon cancer Neg Hx    Esophageal cancer Neg Hx    Rectal cancer Neg Hx    Stomach cancer Neg Hx      Social History   Socioeconomic History   Marital status: Married    Spouse name: Not on file   Number of children: Not on file   Years of education: Not on file   Highest education level: Not on file  Occupational History   Not on file  Tobacco Use   Smoking status: Never   Smokeless tobacco: Never  Vaping Use   Vaping Use: Never used  Substance and Sexual Activity   Alcohol use: Yes    Comment: daily wine, 2-3 per day, no h/o w/d   Drug use: No   Sexual activity: Not on file  Other Topics Concern   Not on file  Social History Narrative   Not on file   Social Determinants of Health   Financial Resource Strain: Not on file  Food Insecurity: Not  on file  Transportation Needs: Not on file  Physical Activity: Not on file  Stress: Not on file  Social Connections: Not on file  Intimate Partner Violence: Not on file     BP 118/70   Pulse 80   Ht 5' 7"$  (1.702 m)   Wt 179 lb 9.6 oz (81.5 kg)   SpO2 96%   BMI 28.13 kg/m   Physical Exam:  Well appearing NAD HEENT: Unremarkable Neck:  No JVD, no thyromegally Lymphatics:  No adenopathy Back:  No CVA tenderness Lungs:  Clear HEART:  Regular rate rhythm, no murmurs, no rubs, no clicks Abd:  soft, positive bowel sounds, no organomegally, no rebound, no guarding Ext:  2 plus pulses, no edema, no cyanosis, no clubbing Skin:  No rashes no nodules Neuro:  CN II through XII intact, motor grossly intact  EKG  DEVICE  Normal device function.  See PaceArt for details.   Assess/Plan:  PAC's - we discussed the treatment options. It appears that his symptoms are fairly well controlled. Dyspnea/lower extremity swelling - I prescribed him low dose lasix. He will continue as needed. PE - he will continue eliquis. I'll defer decision to stop this to his medical doctors. 4. HTN - his bp is well controlled. No change.   Carleene Overlie Fong Mccarry,MD

## 2022-04-29 NOTE — Patient Instructions (Addendum)
Medication Instructions:  Your physician recommends that you continue on your current medications as directed. Please refer to the Current Medication list given to you today.  *If you need a refill on your cardiac medications before your next appointment, please call your pharmacy*  Lab Work: None ordered.  If you have labs (blood work) drawn today and your tests are completely normal, you will receive your results only by: [place] (if you have MyChart) OR A paper copy in the mail If you have any lab test that is abnormal or we need to change your treatment, we will call you to review the results.  Testing/Procedures: None ordered.  Follow-Up: At [facility], you and your health needs are our priority.  As part of our continuing mission to provide you with exceptional heart care, we have created designated Provider Care Teams.  These Care Teams include your primary Cardiologist (physician) and Advanced Practice Providers (APPs -  Physician Assistants and Nurse Practitioners) who all work together to provide you with the care you need, when you need it.  We recommend signing up for the patient portal called "MyChart".  Sign up information is provided on this After Visit Summary.  MyChart is used to connect with patients for Virtual Visits (Telemedicine).  Patients are able to view lab/test results, encounter notes, upcoming appointments, etc.  Non-urgent messages can be sent to your provider as well.   To learn more about what you can do with MyChart, go to [email or web address].    Your next appointment:   Please schedule a 6 month follow up appointment with Dr. Cristopher Peru.    The format for your next appointment:   In Person  Provider:   Cristopher Peru, MD{or one of the following Advanced Practice Providers on your designated Care Team:   Tommye Standard, [place] Legrand Como "[facility]" [place], [place]

## 2022-05-01 DIAGNOSIS — M25512 Pain in left shoulder: Secondary | ICD-10-CM | POA: Diagnosis not present

## 2022-05-06 DIAGNOSIS — H2513 Age-related nuclear cataract, bilateral: Secondary | ICD-10-CM | POA: Diagnosis not present

## 2022-05-06 DIAGNOSIS — H353221 Exudative age-related macular degeneration, left eye, with active choroidal neovascularization: Secondary | ICD-10-CM | POA: Diagnosis not present

## 2022-05-12 ENCOUNTER — Encounter: Payer: Self-pay | Admitting: Internal Medicine

## 2022-05-12 ENCOUNTER — Telehealth: Payer: Self-pay | Admitting: Internal Medicine

## 2022-05-12 DIAGNOSIS — I482 Chronic atrial fibrillation, unspecified: Secondary | ICD-10-CM

## 2022-05-12 DIAGNOSIS — I491 Atrial premature depolarization: Secondary | ICD-10-CM

## 2022-05-12 MED ORDER — APIXABAN (ELIQUIS) VTE STARTER PACK (10MG AND 5MG): ORAL_TABLET | ORAL | 0 refills | Status: DC

## 2022-05-12 NOTE — Telephone Encounter (Signed)
Pt c/o medication issue:  1. Name of Medication: APIXABAN (ELIQUIS) VTE STARTER PACK ('10MG'$  AND '5MG'$ )   2. How are you currently taking this medication (dosage and times per day)? Take as directed on package: start with two-'5mg'$  tablets twice daily for 7 days. On day 8, switch to one-'5mg'$  tablet twice daily.   3. Are you having a reaction (difficulty breathing--STAT)? No  4. What is your medication issue? [facility] called asking if the eliquis starter pack was ordered by mistake since the pt has been taking the eliquis already. They are requesting for a call back on these orders.

## 2022-05-13 MED ORDER — APIXABAN 5 MG PO TABS: 5.0000 mg | ORAL_TABLET | Freq: Two times a day (BID) | ORAL | 7 refills | Status: DC

## 2022-05-13 NOTE — Telephone Encounter (Signed)
Message received from Triage that medication order was incorrectly sent to [place].   It was for a starter pack, pt has been taking Eliquis 5 mg, BID.    Pt contacted and made aware of order correction, and that I would be sending the corrected order to [place] now.    Pt pharmacy called at [facility], Pharmacy told to D/C starter pack order, and just fill the Eliquis '5mg'$  BID order that I submitted.  Our Telephone number was left if any questions arise.    Dr Lovena Le will be made aware of this as well.

## 2022-05-28 DIAGNOSIS — E298 Other testicular dysfunction: Secondary | ICD-10-CM | POA: Diagnosis not present

## 2022-05-28 DIAGNOSIS — I499 Cardiac arrhythmia, unspecified: Secondary | ICD-10-CM | POA: Diagnosis not present

## 2022-05-28 DIAGNOSIS — R400 Somnolence: Secondary | ICD-10-CM | POA: Diagnosis not present

## 2022-05-28 DIAGNOSIS — D6869 Other thrombophilia: Secondary | ICD-10-CM | POA: Diagnosis not present

## 2022-05-28 DIAGNOSIS — Z86711 Personal history of pulmonary embolism: Secondary | ICD-10-CM | POA: Diagnosis not present

## 2022-05-28 DIAGNOSIS — Z86718 Personal history of other venous thrombosis and embolism: Secondary | ICD-10-CM | POA: Diagnosis not present

## 2022-05-30 ENCOUNTER — Telehealth: Payer: Self-pay | Admitting: Oncology

## 2022-05-30 NOTE — Telephone Encounter (Signed)
scheduled per 3/22 referral , pt has been called and confirmed date and time. Pt is aware of location and to arrive early for check in

## 2022-06-03 DIAGNOSIS — D485 Neoplasm of uncertain behavior of skin: Secondary | ICD-10-CM | POA: Diagnosis not present

## 2022-06-03 DIAGNOSIS — Z85828 Personal history of other malignant neoplasm of skin: Secondary | ICD-10-CM | POA: Diagnosis not present

## 2022-06-03 DIAGNOSIS — L308 Other specified dermatitis: Secondary | ICD-10-CM | POA: Diagnosis not present

## 2022-06-03 DIAGNOSIS — L570 Actinic keratosis: Secondary | ICD-10-CM | POA: Diagnosis not present

## 2022-06-03 DIAGNOSIS — L821 Other seborrheic keratosis: Secondary | ICD-10-CM | POA: Diagnosis not present

## 2022-06-03 DIAGNOSIS — L820 Inflamed seborrheic keratosis: Secondary | ICD-10-CM | POA: Diagnosis not present

## 2022-06-03 DIAGNOSIS — C44329 Squamous cell carcinoma of skin of other parts of face: Secondary | ICD-10-CM | POA: Diagnosis not present

## 2022-06-06 ENCOUNTER — Inpatient Hospital Stay: Payer: Medicare Other

## 2022-06-06 ENCOUNTER — Other Ambulatory Visit: Payer: Self-pay | Admitting: Oncology

## 2022-06-06 ENCOUNTER — Other Ambulatory Visit: Payer: Self-pay

## 2022-06-06 ENCOUNTER — Encounter: Payer: Self-pay | Admitting: Oncology

## 2022-06-06 ENCOUNTER — Inpatient Hospital Stay: Payer: Medicare Other | Attending: Oncology | Admitting: Oncology

## 2022-06-06 VITALS — BP 118/92 | HR 77 | Temp 97.9°F | Resp 16 | Ht 67.0 in | Wt 180.9 lb

## 2022-06-06 DIAGNOSIS — Z7901 Long term (current) use of anticoagulants: Secondary | ICD-10-CM | POA: Insufficient documentation

## 2022-06-06 DIAGNOSIS — R935 Abnormal findings on diagnostic imaging of other abdominal regions, including retroperitoneum: Secondary | ICD-10-CM

## 2022-06-06 DIAGNOSIS — E291 Testicular hypofunction: Secondary | ICD-10-CM | POA: Insufficient documentation

## 2022-06-06 DIAGNOSIS — I2692 Saddle embolus of pulmonary artery without acute cor pulmonale: Secondary | ICD-10-CM | POA: Diagnosis not present

## 2022-06-06 DIAGNOSIS — I82431 Acute embolism and thrombosis of right popliteal vein: Secondary | ICD-10-CM | POA: Insufficient documentation

## 2022-06-06 DIAGNOSIS — D6859 Other primary thrombophilia: Secondary | ICD-10-CM

## 2022-06-06 DIAGNOSIS — I82411 Acute embolism and thrombosis of right femoral vein: Secondary | ICD-10-CM | POA: Insufficient documentation

## 2022-06-06 LAB — CBC WITH DIFFERENTIAL (CANCER CENTER ONLY)
Abs Immature Granulocytes: 0.02 10*3/uL (ref 0.00–0.07)
Basophils Absolute: 0.1 10*3/uL (ref 0.0–0.1)
Basophils Relative: 1 %
Eosinophils Absolute: 0 10*3/uL (ref 0.0–0.5)
Eosinophils Relative: 1 %
HCT: 49.8 % (ref 39.0–52.0)
Hemoglobin: 17.5 g/dL — ABNORMAL HIGH (ref 13.0–17.0)
Immature Granulocytes: 1 %
Lymphocytes Relative: 31 %
Lymphs Abs: 1.1 10*3/uL (ref 0.7–4.0)
MCH: 32.3 pg (ref 26.0–34.0)
MCHC: 35.1 g/dL (ref 30.0–36.0)
MCV: 91.9 fL (ref 80.0–100.0)
Monocytes Absolute: 0.6 10*3/uL (ref 0.1–1.0)
Monocytes Relative: 16 %
Neutro Abs: 1.8 10*3/uL (ref 1.7–7.7)
Neutrophils Relative %: 50 %
Platelet Count: 212 10*3/uL (ref 150–400)
RBC: 5.42 MIL/uL (ref 4.22–5.81)
RDW: 12.8 % (ref 11.5–15.5)
WBC Count: 3.5 10*3/uL — ABNORMAL LOW (ref 4.0–10.5)
nRBC: 0 % (ref 0.0–0.2)

## 2022-06-06 LAB — CMP (CANCER CENTER ONLY)
ALT: 27 U/L (ref 0–44)
AST: 29 U/L (ref 15–41)
Albumin: 4.4 g/dL (ref 3.5–5.0)
Alkaline Phosphatase: 41 U/L (ref 38–126)
Anion gap: 8 (ref 5–15)
BUN: 14 mg/dL (ref 8–23)
CO2: 29 mmol/L (ref 22–32)
Calcium: 9.8 mg/dL (ref 8.9–10.3)
Chloride: 105 mmol/L (ref 98–111)
Creatinine: 1.17 mg/dL (ref 0.61–1.24)
GFR, Estimated: >60 mL/min (ref >60)
Glucose, Bld: 81 mg/dL (ref 70–99)
Potassium: 4 mmol/L (ref 3.5–5.1)
Sodium: 142 mmol/L (ref 135–145)
Total Bilirubin: 0.7 mg/dL (ref 0.3–1.2)
Total Protein: 6.9 g/dL (ref 6.5–8.1)

## 2022-06-06 LAB — APTT: aPTT: 31 seconds (ref 24–36)

## 2022-06-06 LAB — PROTIME-INR
INR: 1.1 (ref 0.8–1.2)
Prothrombin Time: 13.6 seconds (ref 11.4–15.2)

## 2022-06-06 LAB — D-DIMER, QUANTITATIVE: D-Dimer, Quant: 0.67 ug/mL-FEU — ABNORMAL HIGH (ref 0.00–0.50)

## 2022-06-06 LAB — FIBRINOGEN: Fibrinogen: 336 mg/dL (ref 210–475)

## 2022-06-06 NOTE — Progress Notes (Signed)
[facility] Cancer Initial Visit:  Patient Care Team: Cleatis Polka., MD as PCP - General (Internal Medicine)  CHIEF COMPLAINTS/PURPOSE OF CONSULTATION: HISTORY OF PRESENTING ILLNESS: Jacob [name] 72 y.o. male is here because of VTE Medical history notable for depression, DJD, ED, GERD, hemorrhoids, colon polyps (adenomatous and hyperplastic) hyperlipidemia, hypertension  December 04, 2021: Presented to emergency room with shortness of breath and dyspnea on exertion as well as right ankle edema which had worsened over the previous day  History was notable for an episode of atrial fibrillation noted on his Apple Watch for which she was placed on Eliquis.  This has been stopped by cardiology after 4 to 5 days because a new strip showed NSR with frequent PACs.  On November 29, 2020.  Contacted cardiology because of intermittent lightheadedness and shortness of breath He had had elbow surgery 2 weeks prior  CTPA-saddle embolism with bilateral pulmonary emboli, largest burden in right lower lobe.  No evidence of right heart strain. CT abdomen pelvis showed no acute findings  Right lower extremity ultrasound showed occlusive DVT involving right common femoral vein, femoral and popliteal veins  Labs on admission showed a normal BMP. WBC 6.3 hemoglobin 16.7 platelet count 151; 70 segs 16 lymphs 12 monos 1 EO 1 basophil. INR 1.1  He was treated with IV heparin and transition to Eliquis.  June 06 2022:  [facility] Consult Patient had been on Testosterone injections for years prior to the incident.  His urologist has elected to continue it.    Social:  Married.  Retired owned Research scientist (medical).  Tobacco none.  EtOH 2 drinks per day  [facility] Mother died 47 possibly from VTE Father died 54 Parkinson's disease Sister alive 24 melanoma  Sister alive 24 obesity    Review of Systems - Oncology  MEDICAL HISTORY: Past Medical History:  Diagnosis Date    Depression    DJD (degenerative joint disease)    ED (erectile dysfunction) of organic origin    GERD (gastroesophageal reflux disease)    Hemorrhoids    History of adenomatous polyp of colon    12-11-2014  tubular adenoam, hyperplastic   Hyperlipidemia    Hypertension     SURGICAL HISTORY: Past Surgical History:  Procedure Laterality Date   ACHILLES TENDON REPAIR Right 11/2009   ruptured   ANTERIOR CERVICAL DECOMP/DISCECTOMY FUSION  04/23/2009   C7 -- T1   COLONOSCOPY WITH PROPOFOL N/A 12/11/2014   Procedure: COLONOSCOPY WITH PROPOFOL;  Surgeon: Charolett Bumpers, MD;  Location: [facility];  Service: Endoscopy;  Laterality: N/A;   FINGER ARTHRODESIS Right 02/06/2017   Procedure: ARTHRODESIS RIGHT INDEX FINGER DISTAL INTERPHALANGEAL JOINT;  Surgeon: Betha Loa, MD;  Location: [facility];  Service: Orthopedics;  Laterality: Right;   INGUINAL HERNIA REPAIR Bilateral left 03/18/2002/  right -- yrs ago   PENILE PROSTHESIS IMPLANT N/A 04/03/2016   Procedure: PENILE PROTHESIS INFLATABLE;  Surgeon: Marcine Matar, MD;  Location: [facility];  Service: Urology;  Laterality: N/A;   SHOULDER ARTHROSCOPY W/ ROTATOR CUFF REPAIR Right 2002 and 2012   SHOULDER ARTHROSCOPY WITH ROTATOR CUFF REPAIR AND SUBACROMIAL DECOMPRESSION Right 05/06/2012   Procedure: SHOULDER ARTHROSCOPY WITH ROTATOR CUFF REPAIR AND SUBACROMIAL DECOMPRESSION;  Surgeon: Loreta Ave, MD;  Location: [facility];  Service: Orthopedics;  Laterality: Right;  RIGHT SHOULDER ARTHROSCOPY  lysis of adhesions rotator cuff repair    SHOULDER ARTHROSCOPY/  DEBRIDEMENT LABRUM AND ROTATOR CUFF PARTIAL TEAR/  RESECTION BURSA/  ACROMIOPLASTY/  CORACOACROMINAL LIGAMENT RELEASE/  DCR Left 12/17/2001   SOFT TISSUE MASS EXCISION  06/25/2006   right forearm-- angiolipoma   TONSILLECTOMY AND ADENOIDECTOMY  child   TOTAL SHOULDER ARTHROPLASTY Right 02/04/2016    SOCIAL HISTORY: Social History    Socioeconomic History   Marital status: Married    Spouse name: Not on file   Number of children: Not on file   Years of education: Not on file   Highest education level: Not on file  Occupational History   Not on file  Tobacco Use   Smoking status: Never   Smokeless tobacco: Never  Vaping Use   Vaping Use: Never used  Substance and Sexual Activity   Alcohol use: Yes    Comment: daily wine, 2-3 per day, no h/o w/d   Drug use: No   Sexual activity: Not on file  Other Topics Concern   Not on file  Social History Narrative   Not on file   Social Determinants of Health   Financial Resource Strain: Not on file  Food Insecurity: No Food Insecurity (06/06/2022)   Hunger Vital Sign    Worried About Running Out of Food in the Last Year: Never true    Ran Out of Food in the Last Year: Never true  Transportation Needs: No Transportation Needs (06/06/2022)   PRAPARE - Administrator, Civil Service (Medical): No    Lack of Transportation (Non-Medical): No  Physical Activity: Not on file  Stress: Not on file  Social Connections: Not on file  Intimate Partner Violence: Not At Risk (06/06/2022)   Humiliation, Afraid, Rape, and Kick questionnaire    Fear of Current or Ex-Partner: No    Emotionally Abused: No    Physically Abused: No    Sexually Abused: No    FAMILY HISTORY Family History  Problem Relation Age of Onset   Parkinsonism Father    Colon cancer Neg Hx    Esophageal cancer Neg Hx    Rectal cancer Neg Hx    Stomach cancer Neg Hx     ALLERGIES:  is allergic to vancomycin, hydrocodone, penicillins, and percocet [oxycodone-acetaminophen].  MEDICATIONS:  Current Outpatient Medications  Medication Sig Dispense Refill   amLODipine (NORVASC) 10 MG tablet Take 10 mg by mouth daily in the afternoon.     anastrozole (ARIMIDEX) 1 MG tablet Take 1 mg by mouth daily.     apixaban (ELIQUIS) 5 MG TABS tablet Take 1 tablet (5 mg total) by mouth 2 (two) times daily.  90 tablet 7   atorvastatin (LIPITOR) 20 MG tablet Take 20 mg by mouth every morning.      baclofen (LIORESAL) 10 MG tablet Take 1 tablet by mouth 3 (three) times daily as needed.     buPROPion (WELLBUTRIN XL) 300 MG 24 hr tablet Take 300 mg by mouth every morning.      EPINEPHrine 0.3 mg/0.3 mL IJ SOAJ injection      fluorouracil (EFUDEX) 5 % cream Apply topically.     furosemide (LASIX) 20 MG tablet Take 1 tablet (20 mg total) by mouth daily as needed. Only Take AS NEEDED. 90 tablet 3   irbesartan (AVAPRO) 300 MG tablet Take 300 mg by mouth every morning.      MAGNESIUM MALATE PO Take 1,350 mg by mouth in the morning, at noon, and at bedtime.     Omega-3 Fatty Acids (FISH OIL PO) Take 2 tablets by mouth daily.  omeprazole (PRILOSEC) 40 MG capsule Take 40 mg by mouth every morning.      tamsulosin (FLOMAX) 0.4 MG CAPS capsule Take 0.4 mg by mouth daily.     TESTOSTERONE IM Inject 1.5 mLs into the muscle every 14 (fourteen) days.     TRINTELLIX 10 MG TABS tablet Take 10 mg by mouth daily.     valACYclovir (VALTREX) 500 MG tablet Take 500 mg by mouth as needed (for flares).     No current facility-administered medications for this visit.    PHYSICAL EXAMINATION:  ECOG PERFORMANCE STATUS: 0 - Asymptomatic   Vitals:   06/06/22 0832  BP: (!) 118/92  Pulse: 77  Resp: 16  Temp: 97.9 F (36.6 C)  SpO2: 96%    Filed Weights   06/06/22 0832  Weight: 180 lb 14.4 oz (82.1 kg)     Physical Exam Vitals and nursing note reviewed.  Constitutional:      General: He is not in acute distress.    Appearance: Normal appearance. He is normal weight. He is not ill-appearing, toxic-appearing or diaphoretic.     Comments: Here alone.  Fair complexion.  Solar damage to skin  HENT:     Head: Normocephalic and atraumatic.     Right Ear: External ear normal.     Left Ear: External ear normal.     Nose: Nose normal.  Eyes:     Conjunctiva/sclera: Conjunctivae normal.     Pupils: Pupils are  equal, round, and reactive to light.  Cardiovascular:     Rate and Rhythm: Normal rate and regular rhythm.     Heart sounds: Normal heart sounds. No murmur heard.    No friction rub. No gallop.  Pulmonary:     Effort: Pulmonary effort is normal. No respiratory distress.     Breath sounds: Normal breath sounds. No wheezing.  Abdominal:     General: Abdomen is flat.     Palpations: Abdomen is soft.     Tenderness: There is no abdominal tenderness. There is no guarding or rebound.     Hernia: No hernia is present.  Musculoskeletal:        General: No swelling or deformity. Normal range of motion.     Cervical back: Normal range of motion and neck supple. No rigidity or tenderness.     Right lower leg: No edema.     Left lower leg: No edema.  Lymphadenopathy:     Head:     Right side of head: No submental, submandibular, tonsillar, preauricular, posterior auricular or occipital adenopathy.     Left side of head: No submental, submandibular, tonsillar, preauricular, posterior auricular or occipital adenopathy.     Cervical: No cervical adenopathy.     Right cervical: No superficial, deep or posterior cervical adenopathy.    Left cervical: No superficial, deep or posterior cervical adenopathy.     Upper Body:     Right upper body: No supraclavicular or axillary adenopathy.     Left upper body: No supraclavicular or axillary adenopathy.     Lower Body: No right inguinal adenopathy. No left inguinal adenopathy.  Skin:    General: Skin is warm.     Coloration: Skin is not jaundiced.     Findings: No bruising or rash.  Neurological:     General: No focal deficit present.     Mental Status: He is alert and oriented to person, place, and time.     Cranial Nerves: No cranial nerve deficit.  Psychiatric:  Mood and Affect: Mood normal.        Behavior: Behavior normal.        Thought Content: Thought content normal.        Judgment: Judgment normal.     LABORATORY DATA: I have  personally reviewed the data as listed:  Appointment on 06/06/2022  Component Date Value Ref Range Status   Rheumatoid fact SerPl-aCnc 06/06/2022 <10.0  <14.0 IU/mL Final   Comment: (NOTE) Performed At: [facility] [address] [place], [place] [id number] Jolene Schimke MD [id number]    ds DNA Ab 06/06/2022 3  0 - 9 IU/mL Final   Comment: (NOTE)                                   Negative      <5                                   Equivocal  5 - 9                                   Positive      >9    Ribonucleic Protein 06/06/2022 <0.2  0.0 - 0.9 AI Final   ENA SM Ab Ser-aCnc 06/06/2022 <0.2  0.0 - 0.9 AI Final   Scleroderma (Scl-70) (ENA) Antibod* 06/06/2022 <0.2  0.0 - 0.9 AI Final   SSA (Ro) (ENA) Antibody, IgG 06/06/2022 <0.2  0.0 - 0.9 AI Final   SSB (La) (ENA) Antibody, IgG 06/06/2022 <0.2  0.0 - 0.9 AI Final   Chromatin Ab SerPl-aCnc 06/06/2022 <0.2  0.0 - 0.9 AI Final   Anti JO-1 06/06/2022 <0.2  0.0 - 0.9 AI Final   Centromere Ab Screen 06/06/2022 <0.2  0.0 - 0.9 AI Final   See below: 06/06/2022 Comment   Final   Comment: (NOTE) Autoantibody                       Disease Association ------------------------------------------------------------                        Condition                  Frequency ---------------------   ------------------------   --------- Antinuclear Antibody,    SLE, mixed connective Direct (ANA-D)           tissue diseases ---------------------   ------------------------   --------- dsDNA                    SLE                        40 - 60% ---------------------   ------------------------   --------- Chromatin                Drug induced SLE                90%                         SLE                        48 - 97% ---------------------   ------------------------   ---------  SSA (Ro)                 SLE                        25 - 35%                         Sjogren's Syndrome         40 - 70%                         Neonatal  Lupus                 100% ---------------------   ------------------------   --------- SSB (La)                 SLE                                                       10%                         Sjogren's Syndrome              30% ---------------------   -----------------------    --------- Sm (anti-Smith)          SLE                        15 - 30% ---------------------   -----------------------    --------- RNP                      Mixed Connective Tissue                         Disease                         95% (U1 nRNP,                SLE                        30 - 50% anti-ribonucleoprotein)  Polymyositis and/or                         Dermatomyositis                 20% ---------------------   ------------------------   --------- Scl-70 (antiDNA          Scleroderma (diffuse)      20 - 35% topoisomerase)           Crest                           13% ---------------------   ------------------------   --------- Jo-1                     Polymyositis and/or                         Dermatomyositis            20 -  40% ---------------------   ------------------------   --------- Centromere B             Scleroderma -                           Crest                         variant                         80% Performed At: [facility] [address] [place], [place] [id number] Jolene Schimke MD XF:[id number]    Fibrinogen 06/06/2022 336  210 - 475 mg/dL Final   Comment: (NOTE) Fibrinogen results may be underestimated in patients receiving thrombolytic therapy. Performed at [facility], [address] [place], [place] [address]    D-Dimer, Quant 06/06/2022 0.67 (H)  0.00 - 0.50 ug/mL-FEU Final   Comment: (NOTE) At the manufacturer cut-off value of 0.5 g/mL FEU, this assay has a negative predictive value of 95-100%.This assay is intended for use in conjunction with a clinical pretest probability (PTP) assessment model to exclude pulmonary  embolism (PE) and deep venous thrombosis (DVT) in outpatients suspected of PE or DVT. Results should be correlated with clinical presentation. Performed at [facility], [address] [place], [place] [address]    Prothrombin Time 06/06/2022 13.6  11.4 - 15.2 seconds Final   INR 06/06/2022 1.1  0.8 - 1.2 Final   Comment: (NOTE) INR goal varies based on device and disease states. Performed at [facility], [address], [place], [place] [address]    aPTT 06/06/2022 31  24 - 36 seconds Final   Performed at [facility], [address] [place], [place] [address]   Sodium 06/06/2022 142  135 - 145 mmol/L Final   Potassium 06/06/2022 4.0  3.5 - 5.1 mmol/L Final   Chloride 06/06/2022 105  98 - 111 mmol/L Final   CO2 06/06/2022 29  22 - 32 mmol/L Final   Glucose, Bld 06/06/2022 81  70 - 99 mg/dL Final   Glucose reference range applies only to samples taken after fasting for at least 8 hours.   BUN 06/06/2022 14  8 - 23 mg/dL Final   Creatinine 78/24/2353 1.17  0.61 - 1.24 mg/dL Final   Calcium 61/44/3154 9.8  8.9 - 10.3 mg/dL Final   Total Protein 00/86/7619 6.9  6.5 - 8.1 g/dL Final   Albumin 50/93/2671 4.4  3.5 - 5.0 g/dL Final   AST 24/58/0998 29  15 - 41 U/L Final   ALT 06/06/2022 27  0 - 44 U/L Final   Alkaline Phosphatase 06/06/2022 41  38 - 126 U/L Final   Total Bilirubin 06/06/2022 0.7  0.3 - 1.2 mg/dL Final   GFR, Estimated 06/06/2022 >60  >60 mL/min Final   Comment: (NOTE) Calculated using the CKD-EPI Creatinine Equation (2021)    Anion gap 06/06/2022 8  5 - 15 Final   Performed at [facility], [address], [place], [place] [address]   WBC Count 06/06/2022 3.5 (L)  4.0 - 10.5 K/uL Final   RBC 06/06/2022 5.42  4.22 - 5.81 MIL/uL Final   Hemoglobin 06/06/2022 17.5 (H)  13.0 - 17.0 g/dL Final   HCT 05/39/7673 49.8  39.0 - 52.0 % Final   MCV 06/06/2022 91.9  80.0 - 100.0  fL Final   MCH  06/06/2022 32.3  26.0 - 34.0 pg Final   MCHC 06/06/2022 35.1  30.0 - 36.0 g/dL Final   RDW 16/12/9602 12.8  11.5 - 15.5 % Final   Platelet Count 06/06/2022 212  150 - 400 K/uL Final   nRBC 06/06/2022 0.0  0.0 - 0.2 % Final   Neutrophils Relative % 06/06/2022 50  % Final   Neutro Abs 06/06/2022 1.8  1.7 - 7.7 K/uL Final   Lymphocytes Relative 06/06/2022 31  % Final   Lymphs Abs 06/06/2022 1.1  0.7 - 4.0 K/uL Final   Monocytes Relative 06/06/2022 16  % Final   Monocytes Absolute 06/06/2022 0.6  0.1 - 1.0 K/uL Final   Eosinophils Relative 06/06/2022 1  % Final   Eosinophils Absolute 06/06/2022 0.0  0.0 - 0.5 K/uL Final   Basophils Relative 06/06/2022 1  % Final   Basophils Absolute 06/06/2022 0.1  0.0 - 0.1 K/uL Final   Immature Granulocytes 06/06/2022 1  % Final   Abs Immature Granulocytes 06/06/2022 0.02  0.00 - 0.07 K/uL Final   Performed at [facility], [address] [place], [place] [address]   Prostate Specific Ag, Serum 06/06/2022 2.8  0.0 - 4.0 ng/mL Final   Comment: (NOTE) Roche ECLIA methodology. According to the American Urological Association, Serum PSA should decrease and remain at undetectable levels after radical prostatectomy. The AUA defines biochemical recurrence as an initial PSA value 0.2 ng/mL or greater followed by a subsequent confirmatory PSA value 0.2 ng/mL or greater. Values obtained with different assay methods or kits cannot be used interchangeably. Results cannot be interpreted as absolute evidence of the presence or absence of malignant disease. Performed At: [facility] [address] [place], [place] [id number] Jolene Schimke MD FA:[id number]   Office Visit on 06/06/2022  Component Date Value Ref Range Status   Beta-2 Glyco I IgG 06/06/2022 <9  0 - 20 GPI IgG units Final   Comment: (NOTE) The reference interval reflects a 3SD or 99th percentile interval, which is thought to represent a potentially clinically  significant result in accordance with the International Consensus Statement on the classification criteria for definitive antiphospholipid syndrome (APS). J Thromb Haem 2006;4:295-306.    Beta-2-Glycoprotein I IgM 06/06/2022 <9  0 - 32 GPI IgM units Final   Comment: (NOTE) The reference interval reflects a 3SD or 99th percentile interval, which is thought to represent a potentially clinically significant result in accordance with the International Consensus Statement on the classification criteria for definitive antiphospholipid syndrome (APS). J Thromb Haem 2006;4:295-306. Performed At: [facility] [address] [place], [place] [id number] Jolene Schimke MD [id number]    Beta-2-Glycoprotein I IgA 06/06/2022 <9  0 - 25 GPI IgA units Final   Comment: (NOTE) The reference interval reflects a 3SD or 99th percentile interval, which is thought to represent a potentially clinically significant result in accordance with the International Consensus Statement on the classification criteria for definitive antiphospholipid syndrome (APS). J Thromb Haem 2006;4:295-306.    Anticardiolipin IgG 06/06/2022 <9  0 - 14 GPL U/mL Final   Comment: (NOTE)                          Negative:              <15  Indeterminate:     15 - 20                          Low-Med Positive: >20 - 80                          High Positive:         >80    Anticardiolipin IgM 06/06/2022 <9  0 - 12 MPL U/mL Final   Comment: (NOTE)                          Negative:              <13                          Indeterminate:     13 - 20                          Low-Med Positive: >20 - 80                          High Positive:         >80 Performed At: [facility] [address] [place], [place] [id number] Jolene Schimke MD [id number]    Interpretation 06/06/2022 Comment   Final   Comment: See Scanned report in March ARB Link (NOTE) Peripheral Blood: No evidence of  paroxysmal nocturnal hemoglobinuria (PNH) DISCLAIMER: REFER TO HARDCOPY OR PDF FOR COMPLETE RESULT. If synopsis provided, clinical decisions should not be based on this interfaced synopsis alone. Performed At: ;# [facility] [address], [place] [id number] Donnald Garre MD XH:[id number] Performed at [facility], [address], [place], [place] [address]    Testosterone 06/06/2022 820  264 - 916 ng/dL Final   Comment: (NOTE) Adult male reference interval is based on a population of healthy nonobese males (BMI <30) between 63 and 18 years old. Travison, et.al. JCEM [phone]. PMID: [id number]. Performed At: [facility] [address] [place], [place] [id number] Jolene Schimke MD PJ:[id number]     RADIOGRAPHIC STUDIES: I have personally reviewed the radiological images as listed and agree with the findings in the report  No results found.  ASSESSMENT/PLAN 72 y.o. male is here because of VTE.   Medical history notable for depression, DJD, ED, GERD, hemorrhoids, colon polyps (adenomatous and hyperplastic) hyperlipidemia, hypertension  Pulmonary embolism  December 05 2022- Presented with a fib, SOB.  CT PA showed saddle embolism with bilateral pulmonary emboli.  No right heart strain  RLE U/S showed occlusive proximal DVT.  Treated with IV heparin followed by Eliquis  VTE risk factors  June 06 2022- Doubtful that the elbow surgery in early September 2023 constituted a risk factor  The most obvious risk factor is testosterone replacement therapy  Testosterone replacement:  Case crossover study analyzed data from 40 622 men (mean [SD] age, 66.4 [14.2] years).  3110 men (7.8%) had evidence of hypogonadism. In age-adjusted models, testosterone therapy use in all case periods was associated with a higher risk of VTE in men with (odds ratio [OR], 2.32; 95% CI, 1.97-2.74) and without (OR, 2.02; 95% CI,  1.47-2.77) hypogonadism. Among men without hypogonadism, the point estimate for testosterone therapy and VTE risk in the 37-month  case period was higher for men younger than 65 years (OR, 2.99; 95% CI, 1.91-4.68) than for older men (OR, 1.68; 95% CI, 0.90-3.14), although this interaction was not statistically significant (P = .14). Conclusions and relevance: Testosterone therapy was associated with an increase in short-term risk for VTE among men with and without hypogonadism, with some evidence that the association was more pronounced among younger men. These findings suggest that caution should be used when prescribing testosterone therapy.  (Reference:  JAMA Intern Med  2020 Feb 1;180(2):190-197. doi: 10.1001/jamainternmed.5784.6962. Association of Testosterone Therapy With Risk of Venous Thromboembolism Among Men With and Without Hypogonadism)   Hypercoagulable state evaluation June 06 2022- Will evaluate for additional risk factors obtain CBC with differential, CMP, PSA, INR, PTT, D-dimer, fibrinogen, ANA, rheumatoid factor, factor V Leiden, prothrombin gene, testosterone, PNH, anticardiolipin antibodies, beta-2 glycoprotein  Therapeutics:  June 06 2022: Continue full dose anticoagulation at this time with consideration given to downward titration of testosterone to lowest effective dose      Cancer Staging  No matching staging information was found for the patient.   No problem-specific Assessment & Plan notes found for this encounter.    Orders Placed This Encounter  Procedures   CBC with Differential (Cancer Center Only)    Standing Status:   Future    Number of Occurrences:   1    Standing Expiration Date:   06/06/2023   CMP (Cancer Center only)    Standing Status:   Future    Number of Occurrences:   1    Standing Expiration Date:   06/06/2023   APTT    Standing Status:   Future    Number of Occurrences:   1    Standing Expiration Date:   06/06/2023   Protime-INR     Standing Status:   Future    Number of Occurrences:   1    Standing Expiration Date:   06/06/2023   D-dimer, quantitative    Standing Status:   Future    Number of Occurrences:   1    Standing Expiration Date:   06/06/2023   Fibrinogen    Standing Status:   Future    Number of Occurrences:   1    Standing Expiration Date:   06/06/2023   Beta-2-glycoprotein i abs, IgG/M/A   Cardiolipin antibodies, IgM+IgG   PNH Profile (-High Sensitivity)   Testosterone   Prothrombin gene mutation   Factor 5 leiden   ANA Comprehensive Panel    Standing Status:   Future    Number of Occurrences:   1    Standing Expiration Date:   06/06/2023   Rheumatoid factor    Standing Status:   Future    Number of Occurrences:   1    Standing Expiration Date:   06/06/2023    60  minutes was spent in patient care.  This included time spent preparing to see the patient (e.g., review of tests), obtaining and/or reviewing separately obtained history, counseling and educating the patient/family/caregiver, ordering medications, tests, or procedures; documenting clinical information in the electronic or other health record, independently interpreting results and communicating results to the patient/family/caregiver as well as coordination of care.       All questions were answered. The patient knows to call the clinic with any problems, questions or concerns.  This note was electronically signed.    Loni Muse, MD  06/18/2022 10:16 AM

## 2022-06-07 LAB — ANA COMPREHENSIVE PANEL
Anti JO-1: <0.2 AI (ref 0.0–0.9)
Centromere Ab Screen: <0.2 AI (ref 0.0–0.9)
Chromatin Ab SerPl-aCnc: <0.2 AI (ref 0.0–0.9)
ENA SM Ab Ser-aCnc: <0.2 AI (ref 0.0–0.9)
Ribonucleic Protein: <0.2 AI (ref 0.0–0.9)
SSA (Ro) (ENA) Antibody, IgG: <0.2 AI (ref 0.0–0.9)
SSB (La) (ENA) Antibody, IgG: <0.2 AI (ref 0.0–0.9)
Scleroderma (Scl-70) (ENA) Antibody, IgG: <0.2 AI (ref 0.0–0.9)
ds DNA Ab: 3 IU/mL (ref 0–9)

## 2022-06-07 LAB — PROSTATE-SPECIFIC AG, SERUM (LABCORP): Prostate Specific Ag, Serum: 2.8 ng/mL (ref 0.0–4.0)

## 2022-06-08 LAB — BETA-2-GLYCOPROTEIN I ABS, IGG/M/A
Beta-2 Glyco I IgG: <9 GPI IgG units (ref 0–20)
Beta-2-Glycoprotein I IgA: <9 GPI IgA units (ref 0–25)
Beta-2-Glycoprotein I IgM: <9 GPI IgM units (ref 0–32)

## 2022-06-08 LAB — CARDIOLIPIN ANTIBODIES, IGM+IGG
Anticardiolipin IgG: <9 GPL U/mL (ref 0–14)
Anticardiolipin IgM: <9 MPL U/mL (ref 0–12)

## 2022-06-08 LAB — TESTOSTERONE: Testosterone: 820 ng/dL (ref 264–916)

## 2022-06-08 LAB — RHEUMATOID FACTOR: Rheumatoid fact SerPl-aCnc: <10 IU/mL (ref <14.0)

## 2022-06-09 DIAGNOSIS — H2513 Age-related nuclear cataract, bilateral: Secondary | ICD-10-CM | POA: Diagnosis not present

## 2022-06-09 DIAGNOSIS — H353221 Exudative age-related macular degeneration, left eye, with active choroidal neovascularization: Secondary | ICD-10-CM | POA: Diagnosis not present

## 2022-06-09 DIAGNOSIS — H35413 Lattice degeneration of retina, bilateral: Secondary | ICD-10-CM | POA: Diagnosis not present

## 2022-06-10 ENCOUNTER — Telehealth: Payer: Self-pay | Admitting: Oncology

## 2022-06-10 DIAGNOSIS — H353221 Exudative age-related macular degeneration, left eye, with active choroidal neovascularization: Secondary | ICD-10-CM | POA: Diagnosis not present

## 2022-06-10 DIAGNOSIS — H2513 Age-related nuclear cataract, bilateral: Secondary | ICD-10-CM | POA: Diagnosis not present

## 2022-06-10 DIAGNOSIS — H35413 Lattice degeneration of retina, bilateral: Secondary | ICD-10-CM | POA: Diagnosis not present

## 2022-06-10 NOTE — Telephone Encounter (Signed)
Scheduled per 03/30 los, patient has been called and voicemail was left. 

## 2022-06-12 DIAGNOSIS — M25512 Pain in left shoulder: Secondary | ICD-10-CM | POA: Diagnosis not present

## 2022-06-12 LAB — PNH PROFILE (-HIGH SENSITIVITY)

## 2022-06-16 DIAGNOSIS — M25512 Pain in left shoulder: Secondary | ICD-10-CM | POA: Diagnosis not present

## 2022-06-19 LAB — PROTHROMBIN GENE MUTATION

## 2022-06-20 ENCOUNTER — Inpatient Hospital Stay: Payer: Medicare Other | Admitting: Oncology

## 2022-06-23 LAB — FACTOR 5 LEIDEN

## 2022-06-24 ENCOUNTER — Inpatient Hospital Stay: Payer: Medicare Other | Attending: Oncology | Admitting: Oncology

## 2022-06-24 DIAGNOSIS — N529 Male erectile dysfunction, unspecified: Secondary | ICD-10-CM

## 2022-06-24 DIAGNOSIS — D6859 Other primary thrombophilia: Secondary | ICD-10-CM

## 2022-06-24 DIAGNOSIS — D6851 Activated protein C resistance: Secondary | ICD-10-CM | POA: Diagnosis not present

## 2022-06-24 DIAGNOSIS — I2692 Saddle embolus of pulmonary artery without acute cor pulmonale: Secondary | ICD-10-CM | POA: Diagnosis not present

## 2022-06-24 DIAGNOSIS — I82401 Acute embolism and thrombosis of unspecified deep veins of right lower extremity: Secondary | ICD-10-CM | POA: Diagnosis not present

## 2022-06-24 NOTE — Progress Notes (Signed)
[facility] Cancer Follow up Visit:  Patient Care Team: Cleatis Polka., MD as PCP - General (Internal Medicine)  CHIEF COMPLAINTS/PURPOSE OF CONSULTATION: HISTORY OF PRESENTING ILLNESS: Jacob [name] 72 y.o. male is here because of VTE Medical history notable for depression, DJD, ED, GERD, hemorrhoids, colon polyps (adenomatous and hyperplastic) hyperlipidemia, hypertension  December 04, 2021: Presented to emergency room with shortness of breath and dyspnea on exertion as well as right ankle edema which had worsened over the previous day  History was notable for an episode of atrial fibrillation noted on his Apple Watch for which she was placed on Eliquis.  This has been stopped by cardiology after 4 to 5 days because a new strip showed NSR with frequent PACs.  On November 29, 2020.  Contacted cardiology because of intermittent lightheadedness and shortness of breath He had had elbow surgery 2 weeks prior  CTPA-saddle embolism with bilateral pulmonary emboli, largest burden in right lower lobe.  No evidence of right heart strain. CT abdomen pelvis showed no acute findings  Right lower extremity ultrasound showed occlusive DVT involving right common femoral vein, femoral and popliteal veins  Labs on admission showed a normal BMP. WBC 6.3 hemoglobin 16.7 platelet count 151; 70 segs 16 lymphs 12 monos 1 EO 1 basophil. INR 1.1  He was treated with IV heparin and transition to Eliquis.  June 06 2022:  [facility] Hematology Consult Patient had been on Testosterone injections for years prior to the incident.  His urologist has elected to continue it.    Social:  Married.  Retired owned Research scientist (medical).  Tobacco none.  EtOH 2 drinks per day  [facility] Mother died 74 possibly from VTE Father died 64 Parkinson's disease Sister alive 100 melanoma  Sister alive 40 obesity  WBC 3.5 hemoglobin 17.5 platelet count 212; 50 segs 31 lymphs 16 monos 1 EO 1 basophil Flow for  PNH negative Patient heterozygous for factor V Leiden gene mutation prothrombin gene mutation not detected D-dimer 0.67 fibrinogen 336 INR 1.1 PTT 31 Anticardiolipin antibody and antibeta 2 glycoprotein negative. ANA panel negative.  Rheumatoid factor negative.  PSA 2.8 Testosterone 820 CMP normal  June 24 2022: Scheduled follow-up regarding VTE.  Reviewed results of labs with patient and his wife.   Telephone visit.  Patient states that three years ago he was noted to have an elevated Hgb level which has been treated with blood donation.  The use of testosterone has well preceded this.  Patient snores loudly and is to go for a sleep study later this month.    Recommend that he decrease testosterone dosing Agree with sleep study because OSA is a risk factor for polycythemia and VTE (Sleep study to be done by [facility]) Continue full dose anticoagulation Follow up 6 weeks    Review of Systems - Oncology  MEDICAL HISTORY: Past Medical History:  Diagnosis Date   Depression    DJD (degenerative joint disease)    ED (erectile dysfunction) of organic origin    GERD (gastroesophageal reflux disease)    Hemorrhoids    History of adenomatous polyp of colon    12-11-2014  tubular adenoam, hyperplastic   Hyperlipidemia    Hypertension     SURGICAL HISTORY: Past Surgical History:  Procedure Laterality Date   ACHILLES TENDON REPAIR Right 11/2009   ruptured   ANTERIOR CERVICAL DECOMP/DISCECTOMY FUSION  04/23/2009   C7 -- T1   COLONOSCOPY WITH PROPOFOL N/A 12/11/2014   Procedure: COLONOSCOPY  WITH PROPOFOL;  Surgeon: Charolett Bumpers, MD;  Location: [facility];  Service: Endoscopy;  Laterality: N/A;   FINGER ARTHRODESIS Right 02/06/2017   Procedure: ARTHRODESIS RIGHT INDEX FINGER DISTAL INTERPHALANGEAL JOINT;  Surgeon: Betha Loa, MD;  Location: [facility];  Service: Orthopedics;  Laterality: Right;   INGUINAL HERNIA REPAIR Bilateral left 03/18/2002/  right --  yrs ago   PENILE PROSTHESIS IMPLANT N/A 04/03/2016   Procedure: PENILE PROTHESIS INFLATABLE;  Surgeon: Marcine Matar, MD;  Location: [facility];  Service: Urology;  Laterality: N/A;   SHOULDER ARTHROSCOPY W/ ROTATOR CUFF REPAIR Right 2002 and 2012   SHOULDER ARTHROSCOPY WITH ROTATOR CUFF REPAIR AND SUBACROMIAL DECOMPRESSION Right 05/06/2012   Procedure: SHOULDER ARTHROSCOPY WITH ROTATOR CUFF REPAIR AND SUBACROMIAL DECOMPRESSION;  Surgeon: Loreta Ave, MD;  Location: [facility];  Service: Orthopedics;  Laterality: Right;  RIGHT SHOULDER ARTHROSCOPY  lysis of adhesions rotator cuff repair    SHOULDER ARTHROSCOPY/  DEBRIDEMENT LABRUM AND ROTATOR CUFF PARTIAL TEAR/  RESECTION BURSA/  ACROMIOPLASTY/  CORACOACROMINAL LIGAMENT RELEASE/  DCR Left 12/17/2001   SOFT TISSUE MASS EXCISION  06/25/2006   right forearm-- angiolipoma   TONSILLECTOMY AND ADENOIDECTOMY  child   TOTAL SHOULDER ARTHROPLASTY Right 02/04/2016    SOCIAL HISTORY: Social History   Socioeconomic History   Marital status: Married    Spouse name: Not on file   Number of children: Not on file   Years of education: Not on file   Highest education level: Not on file  Occupational History   Not on file  Tobacco Use   Smoking status: Never   Smokeless tobacco: Never  Vaping Use   Vaping Use: Never used  Substance and Sexual Activity   Alcohol use: Yes    Comment: daily wine, 2-3 per day, no h/o w/d   Drug use: No   Sexual activity: Not on file  Other Topics Concern   Not on file  Social History Narrative   Not on file   Social Determinants of Health   Financial Resource Strain: Not on file  Food Insecurity: No Food Insecurity (06/06/2022)   Hunger Vital Sign    Worried About Running Out of Food in the Last Year: Never true    Ran Out of Food in the Last Year: Never true  Transportation Needs: No Transportation Needs (06/06/2022)   PRAPARE - Administrator, Civil Service  (Medical): No    Lack of Transportation (Non-Medical): No  Physical Activity: Not on file  Stress: Not on file  Social Connections: Not on file  Intimate Partner Violence: Not At Risk (06/06/2022)   Humiliation, Afraid, Rape, and Kick questionnaire    Fear of Current or Ex-Partner: No    Emotionally Abused: No    Physically Abused: No    Sexually Abused: No    FAMILY HISTORY Family History  Problem Relation Age of Onset   Parkinsonism Father    Colon cancer Neg Hx    Esophageal cancer Neg Hx    Rectal cancer Neg Hx    Stomach cancer Neg Hx     ALLERGIES:  is allergic to vancomycin, hydrocodone, penicillins, and percocet [oxycodone-acetaminophen].  MEDICATIONS:  Current Outpatient Medications  Medication Sig Dispense Refill   amLODipine (NORVASC) 10 MG tablet Take 10 mg by mouth daily in the afternoon.     anastrozole (ARIMIDEX) 1 MG tablet Take 1 mg by mouth daily.     apixaban (ELIQUIS) 5 MG TABS tablet Take  1 tablet (5 mg total) by mouth 2 (two) times daily. 90 tablet 7   atorvastatin (LIPITOR) 20 MG tablet Take 20 mg by mouth every morning.      baclofen (LIORESAL) 10 MG tablet Take 1 tablet by mouth 3 (three) times daily as needed.     buPROPion (WELLBUTRIN XL) 300 MG 24 hr tablet Take 300 mg by mouth every morning.      EPINEPHrine 0.3 mg/0.3 mL IJ SOAJ injection      fluorouracil (EFUDEX) 5 % cream Apply topically.     furosemide (LASIX) 20 MG tablet Take 1 tablet (20 mg total) by mouth daily as needed. Only Take AS NEEDED. 90 tablet 3   irbesartan (AVAPRO) 300 MG tablet Take 300 mg by mouth every morning.      MAGNESIUM MALATE PO Take 1,350 mg by mouth in the morning, at noon, and at bedtime.     Omega-3 Fatty Acids (FISH OIL PO) Take 2 tablets by mouth daily.     omeprazole (PRILOSEC) 40 MG capsule Take 40 mg by mouth every morning.      tamsulosin (FLOMAX) 0.4 MG CAPS capsule Take 0.4 mg by mouth daily.     TESTOSTERONE IM Inject 1.5 mLs into the muscle every 14  (fourteen) days.     TRINTELLIX 10 MG TABS tablet Take 10 mg by mouth daily.     valACYclovir (VALTREX) 500 MG tablet Take 500 mg by mouth as needed (for flares).     No current facility-administered medications for this visit.    PHYSICAL EXAMINATION:  ECOG PERFORMANCE STATUS: 0 - Asymptomatic   There were no vitals filed for this visit.   There were no vitals filed for this visit.    Physical Exam  As part of a telephone visit a physical exam was not conducted  LABORATORY DATA: I have personally reviewed the data as listed:  Appointment on 06/06/2022  Component Date Value Ref Range Status   Rheumatoid fact SerPl-aCnc 06/06/2022 <10.0  <14.0 IU/mL Final   Comment: (NOTE) Performed At: [facility] [address] [place], [place] [id number] Jolene Schimke MD WU:[id number]    ds DNA Ab 06/06/2022 3  0 - 9 IU/mL Final   Comment: (NOTE)                                   Negative      <5                                   Equivocal  5 - 9                                   Positive      >9    Ribonucleic Protein 06/06/2022 <0.2  0.0 - 0.9 AI Final   ENA SM Ab Ser-aCnc 06/06/2022 <0.2  0.0 - 0.9 AI Final   Scleroderma (Scl-70) (ENA) Antibod* 06/06/2022 <0.2  0.0 - 0.9 AI Final   SSA (Ro) (ENA) Antibody, IgG 06/06/2022 <0.2  0.0 - 0.9 AI Final   SSB (La) (ENA) Antibody, IgG 06/06/2022 <0.2  0.0 - 0.9 AI Final   Chromatin Ab SerPl-aCnc 06/06/2022 <0.2  0.0 - 0.9 AI Final   Anti JO-1 06/06/2022 <0.2  0.0 - 0.9 AI  Final   Centromere Ab Screen 06/06/2022 <0.2  0.0 - 0.9 AI Final   See below: 06/06/2022 Comment   Final   Comment: (NOTE) Autoantibody                       Disease Association ------------------------------------------------------------                        Condition                  Frequency ---------------------   ------------------------   --------- Antinuclear Antibody,    SLE, mixed connective Direct (ANA-D)           tissue  diseases ---------------------   ------------------------   --------- dsDNA                    SLE                        40 - 60% ---------------------   ------------------------   --------- Chromatin                Drug induced SLE                90%                         SLE                        48 - 97% ---------------------   ------------------------   --------- SSA (Ro)                 SLE                        25 - 35%                         Sjogren's Syndrome         40 - 70%                         Neonatal Lupus                 100% ---------------------   ------------------------   --------- SSB (La)                 SLE                                                       10%                         Sjogren's Syndrome              30% ---------------------   -----------------------    --------- Sm (anti-Smith)          SLE                        15 - 30% ---------------------   -----------------------    --------- RNP  Mixed Connective Tissue                         Disease                         95% (U1 nRNP,                SLE                        30 - 50% anti-ribonucleoprotein)  Polymyositis and/or                         Dermatomyositis                 20% ---------------------   ------------------------   --------- Scl-70 (antiDNA          Scleroderma (diffuse)      20 - 35% topoisomerase)           Crest                           13% ---------------------   ------------------------   --------- Jo-1                     Polymyositis and/or                         Dermatomyositis            20 - 40% ---------------------   ------------------------   --------- Centromere B             Scleroderma -                           Crest                         variant                         80% Performed At: [facility] [address] [place], [place] [id number] Jolene Schimke MD WU:[id number]    Fibrinogen 06/06/2022 336  210 - 475  mg/dL Final   Comment: (NOTE) Fibrinogen results may be underestimated in patients receiving thrombolytic therapy. Performed at [facility], [address] [place], [place] [address]    D-Dimer, Quant 06/06/2022 0.67 (H)  0.00 - 0.50 ug/mL-FEU Final   Comment: (NOTE) At the manufacturer cut-off value of 0.5 g/mL FEU, this assay has a negative predictive value of 95-100%.This assay is intended for use in conjunction with a clinical pretest probability (PTP) assessment model to exclude pulmonary embolism (PE) and deep venous thrombosis (DVT) in outpatients suspected of PE or DVT. Results should be correlated with clinical presentation. Performed at [facility], [address], [place], [place] [address]    Prothrombin Time 06/06/2022 13.6  11.4 - 15.2 seconds Final   INR 06/06/2022 1.1  0.8 - 1.2 Final   Comment: (NOTE) INR goal varies based on device and disease states. Performed at [facility], [address] [place], [place] [address]    aPTT 06/06/2022 31  24 - 36 seconds Final   Performed at [facility], [address], [place], [place] [address]  Sodium 06/06/2022 142  135 - 145 mmol/L Final   Potassium 06/06/2022 4.0  3.5 - 5.1 mmol/L Final   Chloride 06/06/2022 105  98 - 111 mmol/L Final   CO2 06/06/2022 29  22 - 32 mmol/L Final   Glucose, Bld 06/06/2022 81  70 - 99 mg/dL Final   Glucose reference range applies only to samples taken after fasting for at least 8 hours.   BUN 06/06/2022 14  8 - 23 mg/dL Final   Creatinine 16/12/9602 1.17  0.61 - 1.24 mg/dL Final   Calcium 54/11/8117 9.8  8.9 - 10.3 mg/dL Final   Total Protein 14/78/2956 6.9  6.5 - 8.1 g/dL Final   Albumin 21/30/8657 4.4  3.5 - 5.0 g/dL Final   AST 84/69/6295 29  15 - 41 U/L Final   ALT 06/06/2022 27  0 - 44 U/L Final   Alkaline Phosphatase 06/06/2022 41  38 - 126 U/L Final   Total Bilirubin 06/06/2022 0.7  0.3 - 1.2 mg/dL  Final   GFR, Estimated 06/06/2022 >60  >60 mL/min Final   Comment: (NOTE) Calculated using the CKD-EPI Creatinine Equation (2021)    Anion gap 06/06/2022 8  5 - 15 Final   Performed at [facility], [address] [place], [place] [address]   WBC Count 06/06/2022 3.5 (L)  4.0 - 10.5 K/uL Final   RBC 06/06/2022 5.42  4.22 - 5.81 MIL/uL Final   Hemoglobin 06/06/2022 17.5 (H)  13.0 - 17.0 g/dL Final   HCT 24/40/1027 49.8  39.0 - 52.0 % Final   MCV 06/06/2022 91.9  80.0 - 100.0 fL Final   MCH 06/06/2022 32.3  26.0 - 34.0 pg Final   MCHC 06/06/2022 35.1  30.0 - 36.0 g/dL Final   RDW 25/36/6440 12.8  11.5 - 15.5 % Final   Platelet Count 06/06/2022 212  150 - 400 K/uL Final   nRBC 06/06/2022 0.0  0.0 - 0.2 % Final   Neutrophils Relative % 06/06/2022 50  % Final   Neutro Abs 06/06/2022 1.8  1.7 - 7.7 K/uL Final   Lymphocytes Relative 06/06/2022 31  % Final   Lymphs Abs 06/06/2022 1.1  0.7 - 4.0 K/uL Final   Monocytes Relative 06/06/2022 16  % Final   Monocytes Absolute 06/06/2022 0.6  0.1 - 1.0 K/uL Final   Eosinophils Relative 06/06/2022 1  % Final   Eosinophils Absolute 06/06/2022 0.0  0.0 - 0.5 K/uL Final   Basophils Relative 06/06/2022 1  % Final   Basophils Absolute 06/06/2022 0.1  0.0 - 0.1 K/uL Final   Immature Granulocytes 06/06/2022 1  % Final   Abs Immature Granulocytes 06/06/2022 0.02  0.00 - 0.07 K/uL Final   Performed at [facility], [address] [place], [place] [address]   Prostate Specific Ag, Serum 06/06/2022 2.8  0.0 - 4.0 ng/mL Final   Comment: (NOTE) Roche ECLIA methodology. According to the American Urological Association, Serum PSA should decrease and remain at undetectable levels after radical prostatectomy. The AUA defines biochemical recurrence as an initial PSA value 0.2 ng/mL or greater followed by a subsequent confirmatory PSA value 0.2 ng/mL or greater. Values obtained with different assay methods or  kits cannot be used interchangeably. Results cannot be interpreted as absolute evidence of the presence or absence of malignant disease. Performed At: [facility] [address] [place], [place] [id number] Jolene Schimke MD [id number]   Office Visit on 06/06/2022  Component Date Value Ref Range  Status   Beta-2 Glyco I IgG 06/06/2022 <9  0 - 20 GPI IgG units Final   Comment: (NOTE) The reference interval reflects a 3SD or 99th percentile interval, which is thought to represent a potentially clinically significant result in accordance with the International Consensus Statement on the classification criteria for definitive antiphospholipid syndrome (APS). J Thromb Haem 2006;4:295-306.    Beta-2-Glycoprotein I IgM 06/06/2022 <9  0 - 32 GPI IgM units Final   Comment: (NOTE) The reference interval reflects a 3SD or 99th percentile interval, which is thought to represent a potentially clinically significant result in accordance with the International Consensus Statement on the classification criteria for definitive antiphospholipid syndrome (APS). J Thromb Haem 2006;4:295-306. Performed At: [facility] [address] [place], [place] [id number] Jolene Schimke MD [id number]    Beta-2-Glycoprotein I IgA 06/06/2022 <9  0 - 25 GPI IgA units Final   Comment: (NOTE) The reference interval reflects a 3SD or 99th percentile interval, which is thought to represent a potentially clinically significant result in accordance with the International Consensus Statement on the classification criteria for definitive antiphospholipid syndrome (APS). J Thromb Haem 2006;4:295-306.    Anticardiolipin IgG 06/06/2022 <9  0 - 14 GPL U/mL Final   Comment: (NOTE)                          Negative:              <15                          Indeterminate:     15 - 20                          Low-Med Positive: >20 - 80                          High Positive:         >80     Anticardiolipin IgM 06/06/2022 <9  0 - 12 MPL U/mL Final   Comment: (NOTE)                          Negative:              <13                          Indeterminate:     13 - 20                          Low-Med Positive: >20 - 80                          High Positive:         >80 Performed At: [facility] [address] [place], [place] [id number] Jolene Schimke MD [id number]    Interpretation 06/06/2022 Comment   Final   Comment: See Scanned report in [facility] (NOTE) Peripheral Blood: No evidence of paroxysmal nocturnal hemoglobinuria (PNH) DISCLAIMER: REFER TO HARDCOPY OR PDF FOR COMPLETE RESULT. If synopsis provided, clinical decisions should not be based on this interfaced synopsis alone. Performed At: ;# [facility] [address] [place], [place] [id number] Donnald Garre MD VQ:[id number] Performed  at [facility], [address] [place], [place] [address]    Testosterone 06/06/2022 820  264 - 916 ng/dL Final   Comment: (NOTE) Adult male reference interval is based on a population of healthy nonobese males (BMI <30) between 69 and 36 years old. Travison, et.al. JCEM [phone]. PMID: 13086578. Performed At: [facility] [address] [place], [place] [id number] Jolene Schimke MD UX:[id number]    Recommendations-PTGENE: 06/06/2022 Comment   Final   Comment: (NOTE) Result: c.*97G>A - Not Detected This result is not associated with an increased risk for venous thromboembolism. See Additional Clinical Information and Comments. Additional Clinical Information: Venous thromboembolism is a multifactorial disease influenced by genetic, environmental, and circumstantial risk factors. The c.*97G>A variant in the F2 gene is a genetic risk factor for venous thromboembolism. Heterozygous carriers have a 2- to 4-fold increased risk for venous thromboembolism. Homozygotes for the  c.*97G>A variant are rare. The annual risk of VTE in homozygotes has been reported to be 1.1%/year. Individuals who carry both a c.*97G>A variant in the F2 gene and a c.1601G>A (p. Arg534Gln) variant in the F5 gene (commonly referred to as Factor V Leiden) have an approximately 20- fold increased risk for venous thromboembolism. Risks are likely to be even higher in more complex genotype combinations involving the F2 c.*97G>A variant and Factor V Leiden (PMID:                           [id number]). Additional risk factors include but are not limited to: deficiency of protein C, protein S, or antithrombin [name], age, male sex, personal or family history of deep vein thromboembolism, smoking, surgery, prolonged immobilization, malignant neoplasm, tamoxifen treatment, raloxifene treatment, oral contraceptive use, hormone replacement therapy, and pregnancy. Management of thrombotic risk and thrombotic events should follow established guidelines and fit the clinical circumstance. This result cannot predict the occurrence or recurrence of a thrombotic event. Comments: Genetic counseling is recommended to discuss the potential clinical implications of positive results, as well as recommendations for testing family members. Genetic Coordinators are available for health care providers to discuss results at [phone]). Test Details: Variant analyzed: c.*97G>A, previously referred to as G20210A Methods/Limitations: DNA analysis of the F2 gene (NM_000                          506.5) was performed by PCR amplification followed by restriction enzyme analysis. The diagnostic sensitivity is >99%. Results must be combined with clinical information for the most accurate interpretation. Molecular-based testing is highly accurate, but as in any laboratory test, diagnostic errors may occur. False positive or false negative results may occur for reasons that include genetic variants, blood  transfusions, bone marrow transplantation, somatic or tissue-specific mosaicism, mislabeled samples, or erroneous representation of family relationships. This test was developed and its performance characteristics determined by [facility]. It has not been cleared or approved by the Food and Drug Administration. References: Dewitt Hoes [facility], Valentina Lucks [facility]; ACMG Professional Practice and Guidelines Committee. Addendum: [employer] of Medical Genetics consensus statement on factor V Leiden mutation testing. Genet Med. 2021 Mar 5. doi: 59.5638/V564                          [phone]-x. PMID: 33295188. Cherrie Gauze. Prothrombin Thrombophilia. 2006 Jul 25 [Updated 2021 Feb 4]. In: Bufford Buttner, Ardinger HH, Pagon RA, et al., editors. GeneReviews(R) [  Internet]. [address] ([place]): [place], [place]; 6962-9528. Available from: [email or web address].[email or web address]/ Nita Sickle, Blanca Friend, Alvie Heidelberg CS; ACMG Laboratory Quality Assurance Committee. Venous thromboembolism laboratory testing (factor V Leiden and factor II c.*97G>A), 2018 update: a technical standard of the [employer] of [employer] and Genomics (ACMG). Genet Med. 2018 Dec;20(12):1489-1498. doi: 10.1038/s41436-7243124874-z. Epub 2018 Oct 5. PMID: 41324401.    Reviewed by: 06/06/2022 Comment   Final   Comment: (NOTE) Technical Component performed at [facility] RTP Professional Component performed by: Elwyn Lade, Ph.D., [facility] Director, Molecular Genetics [address] [place] [address] Performed At: [facility] [address] [place], [place] [id number] Maurine Simmering MDPhD QQ:[id number]    Recommendations-F5LEID: 06/06/2022 Comment (A)   Final   Comment: (NOTE) Result: c.1601G>A (p.Arg534Gln) - Detected, Heterozygous This result is associated with a 6- to 8-fold increased risk for venous thromboembolism. See Additional Clinical Information  and Comments. Additional Clinical Information: Venous thromboembolism is a multifactorial disease influenced by genetic, environmental, and circumstantial risk factors. The c.1601G>A (p. Arg534Gln) variant in the F5 gene, commonly referred to as Factor V Leiden, is a genetic risk factor for venous thromboembolism. Heterozygous carriers of this variant have a 6- to 8- fold increased risk for venous thromboembolism. Individuals homozygous for this variant (ie, with a copy of the variant on each chromosome) have an approximately 80-fold increased risk for venous thromboembolism. Individuals who carry both a c.*97G>A variant in the F2 gene and Factor V Leiden have an approximately 20-fold increased risk for venous thromboembolism. Risks are likely to be even higher in more complex genoty                          pe combinations involving the F2 c.*97G>A variant and Factor V Leiden (PMID: 33295188). Additional risk factors include but are not limited to: deficiency of protein C, protein S, or antithrombin [name], age, male sex, personal or family history of deep vein thromboembolism, smoking, surgery, prolonged immobilization, malignant neoplasm, tamoxifen treatment, raloxifene treatment, oral contraceptive use, hormone replacement therapy, and pregnancy. Management of thrombotic risk and thrombotic events should follow established guidelines and fit the clinical circumstance. This result cannot predict the occurrence or recurrence of a thrombotic event. Comment: Genetic counseling is recommended to discuss the potential clinical implications of positive results, as well as recommendations for testing family members. Genetic Coordinators are available for health care providers to discuss results at [phone] Test Details: Variant Analyzed: c.1601G>A (p. Arg534Gln), r                          eferred to as Factor V Leiden Methods/Limitations: DNA analysis of the F5 gene  (NM_000130.5) was performed by PCR amplification followed by restriction enzyme analysis. The diagnostic sensitivity is >99%. Results must be combined with clinical information for the most accurate interpretation. Molecular- based testing is highly accurate, but as in any laboratory test, diagnostic errors may occur. False positive or false negative results may occur for reasons that include genetic variants, blood transfusions, bone marrow transplantation, somatic or tissue-specific mosaicism, mislabeled samples, or erroneous representation of family relationships. This test was developed and its performance characteristics determined by [facility]. It has not been cleared or approved by the Food and Drug Administration. References: Dewitt Hoes [facility], Valentina Lucks [facility]; [facility] Professional Practice and Guidelines Committee. Addendum: [employer]  s statement on factor V Leiden mutation testing. Genet Med. 2021 Mar 5. doi: 16.1096/E45409-811- 01108-x. PMID: 91478295. Cherrie Gauze. Factor V Leiden Thrombophilia. 1999 May 14 (Updated 2018 Jan 4). In: Bufford Buttner, Ardinger HH, Pagon RA, et al., editors. GeneReviews(R) (Internet). [address] ([place]): [place], [place]; 6213-0865. Available from: https://harris-mcgee.org/ Nita Sickle, Blanca Friend, Alvie Heidelberg CS; [facility] Laboratory Quality Assurance Committee. Venous thromboembolism laboratory testing (factor V Leiden and factor II c.*97G>A), 2018 update: a technical standard of the [employer] of [employer] and [employer] ([facility]). Genet Med. 2018 Dec;20(12):1489-1498. doi: 10.1038/s41436-858-517-0135-z. Epub 2018 Oct 5. PMID: 78469629.    Reviewed By: 06/06/2022 Comment   Final   Comment: (NOTE) Technical Component performed at [facility] Professional Component performed by: W. Harlan Stains, Ph.D.,  [facility] Director, Molecular Genetics [phone] [facility] [address] [place] [address] Performed At: [facility] [address] [place], [place] [id number] Maurine Simmering MDPhD YQ:[id number]     RADIOGRAPHIC STUDIES: I have personally reviewed the radiological images as listed and agree with the findings in the report  No results found.  ASSESSMENT/PLAN 72 y.o. male is here because of VTE.   Medical history notable for depression, DJD, ED, GERD, hemorrhoids, colon polyps (adenomatous and hyperplastic) hyperlipidemia, hypertension  Pulmonary embolism  December 05 2022- Presented with a fib, SOB.  CT PA showed saddle embolism with bilateral pulmonary emboli.  No right heart strain  RLE U/S showed occlusive proximal DVT.  Treated with IV heparin followed by Eliquis  VTE risk factors  June 06 2022- Doubtful that the elbow surgery in early September 2023 constituted a risk factor  The most obvious risk factor is testosterone replacement therapy  Hypercoagulable state evaluation demonstrated patient is heterozygous for the FV Leiden gene  June 24 2022- History concerning for possible OSA.  To undergo sleep study  Therapeutics:  June 06 2022: Continue full dose anticoagulation at this time with consideration given to downward titration of testosterone to lowest effective dose  June 24 2022- Continue full dose anticoagulation.  Recommend that testosterone dosing be decreased to that which is minimally effective and does not cause polycythemia.  If found to have OSA that should be treated.       Cancer Staging  No matching staging information was found for the patient.   No problem-specific Assessment & Plan notes found for this encounter.    No orders of the defined types were placed in this encounter.   40  minutes was spent in patient care.  This included time spent preparing to see the patient (e.g., review of tests), obtaining and/or reviewing separately obtained history,  counseling and educating the patient/family/caregiver, ordering medications, tests, or procedures; documenting clinical information in the electronic or other health record, independently interpreting results and communicating results to the patient/family/caregiver as well as coordination of care.       All questions were answered. The patient knows to call the clinic with any problems, questions or concerns.  This note was electronically signed.    Loni Muse, MD  06/24/2022 3:25 PM

## 2022-06-25 ENCOUNTER — Telehealth: Payer: Self-pay | Admitting: Oncology

## 2022-06-25 NOTE — Telephone Encounter (Signed)
06/25/22 LVM to scheduled next appt

## 2022-06-30 ENCOUNTER — Telehealth: Payer: Self-pay | Admitting: Oncology

## 2022-07-01 DIAGNOSIS — D6851 Activated protein C resistance: Secondary | ICD-10-CM | POA: Insufficient documentation

## 2022-07-01 DIAGNOSIS — M25812 Other specified joint disorders, left shoulder: Secondary | ICD-10-CM | POA: Diagnosis not present

## 2022-07-02 ENCOUNTER — Telehealth: Payer: Self-pay | Admitting: *Deleted

## 2022-07-02 NOTE — Telephone Encounter (Signed)
   Pre-operative Risk Assessment    Patient Name: Jacob [name]  DOB: October 14, 1950 MRN: [id number]      Request for Surgical Clearance    Procedure:   LEFT SHOULDER REVERSE ARTHROPLASTY  Date of Surgery:  Clearance TBD                                 Surgeon:  DR. Duwayne Heck Surgeon's Group or Practice Name:  [facility] Phone number:  [phone] ATTN: KERRI MAZE Fax number:  [phone]   Type of Clearance Requested:   - Medical  - Pharmacy:  Hold Apixaban (Eliquis)     Type of Anesthesia:   CHOICE   Additional requests/questions:    [name]   07/02/2022, 9:32 AM

## 2022-07-02 NOTE — Telephone Encounter (Signed)
   Name: Jacob [name]  DOB: 1950-03-19  MRN: [id number]  Primary Cardiologist: None   Preoperative team, please contact this patient and set up a phone call appointment for further preoperative risk assessment. Please obtain consent and complete medication review. Thank you for your help.  I confirm that guidance regarding antiplatelet and oral anticoagulation therapy has been completed and, if necessary, noted below.  Pt is on Eliquis for a history of DVT/PE. Per Dr. Lubertha Basque last note, Eliquis is managed per PCP. Therefore, recommendations for holding Eliquis prior to surgery should come from managing provider (PCP).    Joylene Grapes, NP 07/02/2022, 12:09 PM [facility]

## 2022-07-02 NOTE — Telephone Encounter (Signed)
Left message to call back to set up tele pre op appt.  

## 2022-07-02 NOTE — Telephone Encounter (Signed)
Pt has been scheduled for a tele visit,  07/11/22 10:20.  Consent on file / medications reconciled.     Patient Consent for Virtual Visit        Jacob [name] has provided verbal consent on 07/02/2022 for a virtual visit (video or telephone).   CONSENT FOR VIRTUAL VISIT FOR:  Jacob [name]  By participating in this virtual visit I agree to the following:  I hereby voluntarily request, consent and authorize [facility] and its employed or contracted physicians, physician assistants, nurse practitioners or other licensed health care professionals (the Practitioner), to provide me with telemedicine health care services (the "Services") as deemed necessary by the treating Practitioner. I acknowledge and consent to receive the Services by the Practitioner via telemedicine. I understand that the telemedicine visit will involve communicating with the Practitioner through live audiovisual communication technology and the disclosure of certain medical information by electronic transmission. I acknowledge that I have been given the opportunity to request an in-person assessment or other available alternative prior to the telemedicine visit and am voluntarily participating in the telemedicine visit.  I understand that I have the right to withhold or withdraw my consent to the use of telemedicine in the course of my care at any time, without affecting my right to future care or treatment, and that the Practitioner or I may terminate the telemedicine visit at any time. I understand that I have the right to inspect all information obtained and/or recorded in the course of the telemedicine visit and may receive copies of available information for a reasonable fee.  I understand that some of the potential risks of receiving the Services via telemedicine include:  Delay or interruption in medical evaluation due to technological equipment failure or disruption; Information transmitted may not be  sufficient (e.g. poor resolution of images) to allow for appropriate medical decision making by the Practitioner; and/or  In rare instances, security protocols could fail, causing a breach of personal health information.  Furthermore, I acknowledge that it is my responsibility to provide information about my medical history, conditions and care that is complete and accurate to the best of my ability. I acknowledge that Practitioner's advice, recommendations, and/or decision may be based on factors not within their control, such as incomplete or inaccurate data provided by me or distortions of diagnostic images or specimens that may result from electronic transmissions. I understand that the practice of medicine is not an exact science and that Practitioner makes no warranties or guarantees regarding treatment outcomes. I acknowledge that a copy of this consent can be made available to me via my patient portal [facility] MyChart), or I can request a printed copy by calling the office of [facility].    I understand that my insurance will be billed for this visit.   I have read or had this consent read to me. I understand the contents of this consent, which adequately explains the benefits and risks of the Services being provided via telemedicine.  I have been provided ample opportunity to ask questions regarding this consent and the Services and have had my questions answered to my satisfaction. I give my informed consent for the services to be provided through the use of telemedicine in my medical care

## 2022-07-02 NOTE — Telephone Encounter (Signed)
Pt has been scheduled for a tele visit,  07/11/22 10:20.  Consent on file / medications reconciled.

## 2022-07-03 ENCOUNTER — Encounter: Payer: Self-pay | Admitting: Neurology

## 2022-07-03 ENCOUNTER — Ambulatory Visit (INDEPENDENT_AMBULATORY_CARE_PROVIDER_SITE_OTHER): Payer: Medicare Other | Admitting: Neurology

## 2022-07-03 VITALS — BP 128/86 | HR 74 | Ht 67.0 in | Wt 179.8 lb

## 2022-07-03 DIAGNOSIS — R0683 Snoring: Secondary | ICD-10-CM

## 2022-07-03 DIAGNOSIS — Z9189 Other specified personal risk factors, not elsewhere classified: Secondary | ICD-10-CM

## 2022-07-03 DIAGNOSIS — I491 Atrial premature depolarization: Secondary | ICD-10-CM | POA: Diagnosis not present

## 2022-07-03 DIAGNOSIS — Z86711 Personal history of pulmonary embolism: Secondary | ICD-10-CM

## 2022-07-03 DIAGNOSIS — E663 Overweight: Secondary | ICD-10-CM

## 2022-07-03 DIAGNOSIS — R002 Palpitations: Secondary | ICD-10-CM

## 2022-07-03 DIAGNOSIS — G4719 Other hypersomnia: Secondary | ICD-10-CM

## 2022-07-03 NOTE — Patient Instructions (Signed)

## 2022-07-03 NOTE — Progress Notes (Signed)
Subjective:    Patient ID: Jacob [name] is a 72 y.o. male.  HPI    Huston Foley, MD, PhD [facility] [address], [address] [address] [place], [place] [address]  Dear Dr. Clelia Croft,  I saw your patient, Jacob [name], upon your kind request in my sleep clinic today for initial consultation of his sleep disorder, in particular, concern for underlying obstructive sleep apnea.  The patient is unaccompanied today.  As you know, Jacob [name] is a 72 year old male with an underlying complex medical history of hypertension, hyperlipidemia, reflux disease, depression, anxiety, history of transient global amnesia in 2022, suspectedA-fib in August 2023, history of DVT and PE in September 2023, factor V Leiden mutation with hypercoagulable state, on Eliquis, bilateral hip bursitis, arthritis with status post reverse right shoulder replacement in 2017, pending shoulder replacement on the left, history of cervical spine fusion, bilateral rotator cuff repairs, left ulnar nerve release, degenerative joint disease, ED, with status post penile implant, macular edema with status post injections, hypogonadism on testosterone replacement, overactive bladder, history of atrial bigeminy, and overweight state, who reports snoring and excessive daytime somnolence.  His Epworth sleepiness score is 3 out of 24, fatigue severity score is 13 out of 63.  I reviewed your office note from 05/28/2022.  He has nocturia about twice per average night.  Bedtime is generally between 10 and 10:30 PM and rise time between 7 and 8 AM.  He is retired.  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  He has 3 grown children and 11 grandchildren.  He lives with his wife.  He denies recurrent nocturnal or morning headaches. Of note, he has been using an oral appliance through his dentist for the past 1 year to alleviate snoring.  It is a custom fitted device.  He uses it nightly with very minimal  exceptions.  He reports having done a home sleep test with his dentist about 2 years ago, as he recalls it was negative for sleep apnea but showed snoring.  He drinks caffeine in the form of half caff coffee, 1 cup a day.  He drinks alcohol daily, 2-3 drinks per day on average.  He is a non-smoker.  They have 2 dogs in the household.  He has a TV on in his bedroom at night but tends to turn it off before falling asleep.  His Past Medical History Is Significant For: Past Medical History:  Diagnosis Date   Depression    DJD (degenerative joint disease)    DVT (deep venous thrombosis) 12/04/2021   PE/ Facort V Leiden   ED (erectile dysfunction) of organic origin    GERD (gastroesophageal reflux disease)    Hemorrhoids    History of adenomatous polyp of colon    12-11-2014  tubular adenoam, hyperplastic   Hyperlipidemia    Hypertension     His Past Surgical History Is Significant For: Past Surgical History:  Procedure Laterality Date   ACHILLES TENDON REPAIR Right 11/2009   ruptured   ANTERIOR CERVICAL DECOMP/DISCECTOMY FUSION  04/23/2009   C7 -- T1   CARPAL TUNNEL RELEASE Bilateral    2020 (4 months apart)   COLONOSCOPY WITH PROPOFOL N/A 12/11/2014   Procedure: COLONOSCOPY WITH PROPOFOL;  Surgeon: Charolett Bumpers, MD;  Location: [facility];  Service: Endoscopy;  Laterality: N/A;   FINGER ARTHRODESIS Right 02/06/2017   Procedure: ARTHRODESIS RIGHT INDEX FINGER DISTAL INTERPHALANGEAL JOINT;  Surgeon: Betha Loa, MD;  Location: [facility];  Service: Orthopedics;  Laterality: Right;   INGUINAL HERNIA REPAIR Bilateral left 03/18/2002/  right -- yrs ago   PENILE PROSTHESIS IMPLANT N/A 04/03/2016   Procedure: PENILE PROTHESIS INFLATABLE;  Surgeon: Marcine Matar, MD;  Location: [facility];  Service: Urology;  Laterality: N/A;   SHOULDER ARTHROSCOPY W/ ROTATOR CUFF REPAIR Right 2002 and 2012   SHOULDER ARTHROSCOPY WITH ROTATOR CUFF REPAIR AND SUBACROMIAL  DECOMPRESSION Right 05/06/2012   Procedure: SHOULDER ARTHROSCOPY WITH ROTATOR CUFF REPAIR AND SUBACROMIAL DECOMPRESSION;  Surgeon: Loreta Ave, MD;  Location: [facility];  Service: Orthopedics;  Laterality: Right;  RIGHT SHOULDER ARTHROSCOPY  lysis of adhesions rotator cuff repair    SHOULDER ARTHROSCOPY/  DEBRIDEMENT LABRUM AND ROTATOR CUFF PARTIAL TEAR/  RESECTION BURSA/  ACROMIOPLASTY/  CORACOACROMINAL LIGAMENT RELEASE/  DCR Left 12/17/2001   SOFT TISSUE MASS EXCISION  06/25/2006   right forearm-- angiolipoma   TONSILLECTOMY AND ADENOIDECTOMY  child   TOTAL SHOULDER ARTHROPLASTY Right 02/04/2016   ULNAR TUNNEL RELEASE Left    11/2021    His Family History Is Significant For: Family History  Problem Relation Age of Onset   Parkinsonism Father    Cancer Sister    Colon cancer Neg Hx    Esophageal cancer Neg Hx    Rectal cancer Neg Hx    Stomach cancer Neg Hx     His Social History Is Significant For: Social History   Socioeconomic History   Marital status: Married    Spouse name: Not on file   Number of children: Not on file   Years of education: Not on file   Highest education level: Not on file  Occupational History   Not on file  Tobacco Use   Smoking status: Never   Smokeless tobacco: Never  Vaping Use   Vaping Use: Never used  Substance and Sexual Activity   Alcohol use: Yes    Comment: daily wine, 2-3 per day, no h/o w/d   Drug use: No   Sexual activity: Not on file  Other Topics Concern   Not on file  Social History Narrative   Caffiene 1 cup daily coffee daily   Working: retired.    Social Determinants of Health   Financial Resource Strain: Not on file  Food Insecurity: No Food Insecurity (06/06/2022)   Hunger Vital Sign    Worried About Running Out of Food in the Last Year: Never true    Ran Out of Food in the Last Year: Never true  Transportation Needs: No Transportation Needs (06/06/2022)   PRAPARE - Scientist, research (physical sciences) (Medical): No    Lack of Transportation (Non-Medical): No  Physical Activity: Not on file  Stress: Not on file  Social Connections: Not on file    His Allergies Are:  Allergies  Allergen Reactions   Vancomycin Other (See Comments)    Hypotension    Hydrocodone Other (See Comments)    "jumps out of skin"   Penicillins Other (See Comments)    Unknown childhood reaction  Has patient had aCN reaction causing immediate rash, facial/tongue/throat swelling, SOB or lightheadedness with hypotension: No Has patient had a PCN reaction causing severe rash involving mucus membranes or skin necrosis: No Has patient had a PCN reaction that required hospitalization No Has patient had a PCN reaction occurring within the last 10 years: No If all of the above answers are "NO", then may proceed with Cephalosporin use.  Percocet [Oxycodone-Acetaminophen] Other (See Comments)    "jumps out of skin"  :   His Current Medications Are:  Outpatient Encounter Medications as of 07/03/2022  Medication Sig   amLODipine (NORVASC) 10 MG tablet Take 5 mg by mouth daily in the afternoon.   anastrozole (ARIMIDEX) 1 MG tablet Take 1 mg by mouth daily.   apixaban (ELIQUIS) 5 MG TABS tablet Take 1 tablet (5 mg total) by mouth 2 (two) times daily.   atorvastatin (LIPITOR) 20 MG tablet Take 20 mg by mouth every morning.    baclofen (LIORESAL) 10 MG tablet Take 1 tablet by mouth 3 (three) times daily as needed.   buPROPion (WELLBUTRIN XL) 300 MG 24 hr tablet Take 300 mg by mouth every morning.    EPINEPHrine 0.3 mg/0.3 mL IJ SOAJ injection    furosemide (LASIX) 20 MG tablet Take 1 tablet (20 mg total) by mouth daily as needed. Only Take AS NEEDED.   irbesartan (AVAPRO) 300 MG tablet Take 300 mg by mouth every morning.    MAGNESIUM MALATE PO Take 1,350 mg by mouth in the morning.   Omega-3 Fatty Acids (FISH OIL PO) Take 2 tablets by mouth daily.   omeprazole (PRILOSEC) 40 MG capsule Take 40 mg by mouth  every morning.    tamsulosin (FLOMAX) 0.4 MG CAPS capsule Take 0.4 mg by mouth daily.   TESTOSTERONE IM Inject 0.5 mLs into the muscle every 14 (fourteen) days. EVERY 10 DAYS   TRINTELLIX 10 MG TABS tablet Take 10 mg by mouth daily.   valACYclovir (VALTREX) 500 MG tablet Take 500 mg by mouth as needed (for flares).   No facility-administered encounter medications on file as of 07/03/2022.  :   Review of Systems:  Out of a complete 14 point review of systems, all are reviewed and negative with the exception of these symptoms as listed below:   Review of Systems  Neurological:        Snoring, sleepiness, nocturia - rule out OSA.  ESS 3,  FSS 13.  Having a previous SS, about 15 yrs ago.  (No OSA back then).  Has had PE/DVT factor V Leiden.     Objective:  Neurological Exam  Physical Exam Physical Examination:   Vitals:   07/03/22 0921  BP: 128/86  Pulse: 74   General Examination: The patient is a very pleasant 72 y.o. male in no acute distress. He appears well-developed and well-nourished and well groomed.   HEENT: Normocephalic, atraumatic, pupils are equal, round and reactive to light, extraocular tracking is good without limitation to gaze excursion or nystagmus noted. Hearing is grossly intact. Face is symmetric with normal facial animation. Speech is clear with no dysarthria noted. There is no hypophonia. There is no lip, neck/head, jaw or voice tremor. Neck is supple with full range of passive and active motion. There are no carotid bruits on auscultation. Oropharynx exam reveals: mild mouth dryness, adequate dental hygiene and moderate airway crowding, due to small airway entry, redundant soft palate, longer uvula.  Tonsils absent.  Neck circumference 16 5/8 inches, Mallampati class II.  Minimal overbite.  Chest: Clear to auscultation without wheezing, rhonchi or crackles noted.  Heart: S1+S2+0, regular and normal without murmurs, rubs or gallops noted.   Abdomen: Soft,  non-tender and non-distended.  Extremities: There is no pitting edema in the distal lower extremities bilaterally.   Skin: Warm and dry without trophic changes noted.   Musculoskeletal: exam reveals no obvious joint deformities.   Neurologically:  Mental status: The patient is awake, alert and oriented in all 4 spheres. His immediate and remote memory, attention, language skills and fund of knowledge are appropriate. There is no evidence of aphasia, agnosia, apraxia or anomia. Speech is clear with normal prosody and enunciation. Thought process is linear. Mood is normal and affect is normal.  Cranial nerves II - XII are as described above under HEENT exam.  Motor exam: Normal bulk, strength and tone is noted. There is no obvious action or resting tremor.  Fine motor skills and coordination: grossly intact.  Cerebellar testing: No dysmetria or intention tremor. There is no truncal or gait ataxia.  Sensory exam: intact to light touch in the upper and lower extremities.  Gait, station and balance: He stands easily. No veering to one side is noted. No leaning to one side is noted. Posture is age-appropriate and stance is narrow based. Gait shows normal stride length and normal pace. No problems turning are noted.   Assessment and Plan:   In summary, Jacob [name] is a very pleasant 72 y.o.-year old male with an underlying complex medical history of hypertension, hyperlipidemia, reflux disease, depression, anxiety, history of transient global amnesia in 2022, history of A-fib in August 2023, history of DVT and PE in September 2023, bilateral hip bursitis, arthritis with status post reverse right shoulder replacement in 2017, history of cervical spine fusion, bilateral rotator cuff repairs, left ulnar nerve release, degenerative joint disease, ED, with status post penile implant, macular edema with status post injections, hypogonadism on testosterone replacement, overactive bladder, history of atrial  bigeminy, and overweight state, whose history and physical exam are concerning for sleep disordered breathing, supporting a current working diagnosis of unspecified sleep apnea, particularly obstructive sleep apnea (OSA). A laboratory attended sleep study is typically considered "gold standard" for evaluation of sleep disordered breathing.   I had a long chat with the patient about my findings and the diagnosis of sleep apnea, particularly OSA, its prognosis and treatment options. We talked about medical/conservative treatments, surgical interventions and non-pharmacological approaches for symptom control. I explained, in particular, the risks and ramifications of untreated moderate to severe OSA, especially with respect to developing cardiovascular disease down the road, including congestive heart failure (CHF), difficult to treat hypertension, cardiac arrhythmias (particularly A-fib), neurovascular complications including TIA, stroke and dementia. Even type 2 diabetes has, in part, been linked to untreated OSA. Symptoms of untreated OSA may include (but may not be limited to) daytime sleepiness, nocturia (i.e. frequent nighttime urination), memory problems, mood irritability and suboptimally controlled or worsening mood disorder such as depression and/or anxiety, lack of energy, lack of motivation, physical discomfort, as well as recurrent headaches, especially morning or nocturnal headaches. We talked about the importance of maintaining a healthy lifestyle and striving for healthy weight. In addition, we talked about the importance of striving for and maintaining good sleep hygiene.  He is encouraged to scale back on his daily alcohol consumption and limit himself to less than 2 servings per day on average. I recommended a sleep study at this time. I outlined the differences between a laboratory attended sleep study which is considered more comprehensive and accurate over the option of a home sleep test (HST);  the latter may lead to underestimation of sleep disordered breathing in some instances and does not help with diagnosing upper airway resistance syndrome and is not accurate enough to diagnose primary central sleep apnea typically. I outlined possible surgical and non-surgical treatment options of OSA, including  the use of a positive airway pressure (PAP) device (i.e. CPAP, AutoPAP/APAP or BiPAP in certain circumstances), a custom-made dental device (aka oral appliance, which would require a referral to a specialist dentist or orthodontist typically, and is generally speaking not considered for patients with full dentures or edentulous state), upper airway surgical options, such as traditional UPPP (which is not considered a first-line treatment) or the Inspire device (hypoglossal nerve stimulator, which would involve a referral for consultation with an ENT surgeon, after careful selection, following inclusion criteria - also not first-line treatment). I explained the PAP treatment option to the patient in detail, as this is generally considered first-line treatment.  The patient indicated that he would be reluctant but willing to try PAP therapy, if the need arises.  Ongoing treatment with his dental device may be an option for him.  We will pick up our discussion about the next steps and treatment options after testing.  We will keep him posted as to the test results by phone call and/or MyChart messaging where possible.  We will plan to follow-up in sleep clinic accordingly as well.  I answered all his questions today and the patient was in agreement.  He would prefer doing the sleep study after his upcoming left shoulder replacement.  I encouraged him to call with any interim questions, concerns, problems or updates or email [place] through MyChart.  Generally speaking, sleep test authorizations may take up to 2 weeks, sometimes less, sometimes longer, the patient is encouraged to get in touch with [place] if they do  not hear back from the sleep lab staff directly within the next 2 weeks.  Thank you very much for allowing me to participate in the care of this nice patient. If I can be of any further assistance to you please do not hesitate to call me at [phone].  Sincerely,   Huston Foley, MD, PhD

## 2022-07-04 ENCOUNTER — Telehealth: Payer: Self-pay | Admitting: Internal Medicine

## 2022-07-04 NOTE — Telephone Encounter (Signed)
Pt's appt moved to May 6.  Pt will hold medication the am of tele visit till Carlyon Shadow, NP tells pt what to do. Pt has verbalized understanding.

## 2022-07-04 NOTE — Telephone Encounter (Signed)
Patient is scheduled for a tele visit appt on 05/3, but [place] be [place]. Patient would like to know if there is anyway he could have his appt today instead. Patient will not be back in town until May the 5th and is scheduled for surgery on May the 10th. Please advise.

## 2022-07-11 ENCOUNTER — Ambulatory Visit: Payer: Medicare Other

## 2022-07-14 ENCOUNTER — Ambulatory Visit: Payer: Medicare Other | Attending: Cardiology | Admitting: Student

## 2022-07-14 DIAGNOSIS — Z01810 Encounter for preprocedural cardiovascular examination: Secondary | ICD-10-CM

## 2022-07-14 NOTE — Progress Notes (Signed)
Virtual Visit via Telephone Note   Because of ORR VISCOMI [name]'s co-morbid illnesses, he is at least at moderate risk for complications without adequate follow up.  This format is felt to be most appropriate for this patient at this time.  The patient did not have access to video technology/had technical difficulties with video requiring transitioning to audio format only (telephone).  All issues noted in this document were discussed and addressed.  No physical exam could be performed with this format.  Please refer to the patient's chart for his consent to telehealth for [facility].  Evaluation Performed:  Preoperative cardiovascular risk assessment _____________   Date:  07/14/2022   Patient ID:  Jacob, [name] 1950/07/15, MRN [id number] Patient Location:  Home Provider location:   Office  Primary Care Provider:  Cleatis Polka., MD Primary Cardiologist:  Little Ishikawa, MD  Chief Complaint / Patient Profile   72 y.o. y/o male with a h/o hypertension, PACs, DVT/PE postoperatively on anticoagulation who is pending left shoulder reverse arthroplasty by Dr. Aundria Rud and presents today for telephonic preoperative cardiovascular risk assessment.  History of Present Illness    Jacob [name] is a 72 y.o. male who presents via audio/video conferencing for a telehealth visit today.  Pt was last seen in cardiology clinic on 04/29/2022 by Dr. Ladona Ridgel.  At that time Jacob [name] was doing well.  The patient is now pending procedure as outlined above. Since his last visit, he is doing well from a cardiac standpoint. Patient denies shortness of breath or dyspnea on exertion. No chest pain, pressure, or tightness. Denies lower extremity edema, orthopnea, or PND. No palpitations. He stays active walking 1.5-2 miles 4 days a week and performing yard work.   Past Medical History    Past Medical History:  Diagnosis Date   Depression    DJD (degenerative joint disease)     DVT (deep venous thrombosis) (HCC) 12/04/2021   PE/ Facort V Leiden   ED (erectile dysfunction) of organic origin    GERD (gastroesophageal reflux disease)    Hemorrhoids    History of adenomatous polyp of colon    12-11-2014  tubular adenoam, hyperplastic   Hyperlipidemia    Hypertension    Past Surgical History:  Procedure Laterality Date   ACHILLES TENDON REPAIR Right 11/2009   ruptured   ANTERIOR CERVICAL DECOMP/DISCECTOMY FUSION  04/23/2009   C7 -- T1   CARPAL TUNNEL RELEASE Bilateral    2020 (4 months apart)   COLONOSCOPY WITH PROPOFOL N/A 12/11/2014   Procedure: COLONOSCOPY WITH PROPOFOL;  Surgeon: Charolett Bumpers, MD;  Location: [facility];  Service: Endoscopy;  Laterality: N/A;   FINGER ARTHRODESIS Right 02/06/2017   Procedure: ARTHRODESIS RIGHT INDEX FINGER DISTAL INTERPHALANGEAL JOINT;  Surgeon: Betha Loa, MD;  Location: [facility];  Service: Orthopedics;  Laterality: Right;   INGUINAL HERNIA REPAIR Bilateral left 03/18/2002/  right -- yrs ago   PENILE PROSTHESIS IMPLANT N/A 04/03/2016   Procedure: PENILE PROTHESIS INFLATABLE;  Surgeon: Marcine Matar, MD;  Location: [facility];  Service: Urology;  Laterality: N/A;   SHOULDER ARTHROSCOPY W/ ROTATOR CUFF REPAIR Right 2002 and 2012   SHOULDER ARTHROSCOPY WITH ROTATOR CUFF REPAIR AND SUBACROMIAL DECOMPRESSION Right 05/06/2012   Procedure: SHOULDER ARTHROSCOPY WITH ROTATOR CUFF REPAIR AND SUBACROMIAL DECOMPRESSION;  Surgeon: Loreta Ave, MD;  Location: [facility];  Service: Orthopedics;  Laterality: Right;  RIGHT SHOULDER ARTHROSCOPY  lysis of adhesions rotator cuff repair    SHOULDER ARTHROSCOPY/  DEBRIDEMENT LABRUM AND ROTATOR CUFF PARTIAL TEAR/  RESECTION BURSA/  ACROMIOPLASTY/  CORACOACROMINAL LIGAMENT RELEASE/  DCR Left 12/17/2001   SOFT TISSUE MASS EXCISION  06/25/2006   right forearm-- angiolipoma   TONSILLECTOMY AND ADENOIDECTOMY  child   TOTAL SHOULDER  ARTHROPLASTY Right 02/04/2016   ULNAR TUNNEL RELEASE Left    11/2021    Allergies  Allergies  Allergen Reactions   Vancomycin Other (See Comments)    Hypotension    Hydrocodone Other (See Comments)    "jumps out of skin"   Penicillins Other (See Comments)    Unknown childhood reaction  Has patient had aCN reaction causing immediate rash, facial/tongue/throat swelling, SOB or lightheadedness with hypotension: No Has patient had a PCN reaction causing severe rash involving mucus membranes or skin necrosis: No Has patient had a PCN reaction that required hospitalization No Has patient had a PCN reaction occurring within the last 10 years: No If all of the above answers are "NO", then may proceed with Cephalosporin use.   Percocet [Oxycodone-Acetaminophen] Other (See Comments)    "jumps out of skin"    Home Medications    Prior to Admission medications   Medication Sig Start Date End Date Taking? Authorizing Provider  amLODipine (NORVASC) 10 MG tablet Take 5 mg by mouth daily in the afternoon. 11/04/21   [provider]  anastrozole (ARIMIDEX) 1 MG tablet Take 1 mg by mouth daily. 11/01/21   [provider]  apixaban (ELIQUIS) 5 MG TABS tablet Take 1 tablet (5 mg total) by mouth 2 (two) times daily. 05/13/22   Marinus Maw, MD  atorvastatin (LIPITOR) 20 MG tablet Take 20 mg by mouth every morning.     [provider]  baclofen (LIORESAL) 10 MG tablet Take 1 tablet by mouth 3 (three) times daily as needed.    [provider]  buPROPion (WELLBUTRIN XL) 300 MG 24 hr tablet Take 300 mg by mouth every morning.     [provider]  EPINEPHrine 0.3 mg/0.3 mL IJ SOAJ injection     [provider]  furosemide (LASIX) 20 MG tablet Take 1 tablet (20 mg total) by mouth daily as needed. Only Take AS NEEDED. 01/23/22   Marinus Maw, MD  irbesartan (AVAPRO) 300 MG tablet Take 300 mg by mouth every morning.     [provider]   MAGNESIUM MALATE PO Take 1,350 mg by mouth in the morning.    [provider]  Omega-3 Fatty Acids (FISH OIL PO) Take 2 tablets by mouth daily.    [provider]  omeprazole (PRILOSEC) 40 MG capsule Take 40 mg by mouth every morning.     [provider]  tamsulosin (FLOMAX) 0.4 MG CAPS capsule Take 0.4 mg by mouth daily. 01/21/21   [provider]  TESTOSTERONE IM Inject 0.5 mLs into the muscle every 14 (fourteen) days. EVERY 10 DAYS    [provider]  TRINTELLIX 10 MG TABS tablet Take 10 mg by mouth daily. 10/26/20   [provider]  valACYclovir (VALTREX) 500 MG tablet Take 500 mg by mouth as needed (for flares).    [provider]    Physical Exam    Vital Signs:  Jacob [name] does not have vital signs available for review today.  Given telephonic nature of communication, physical exam is limited. AAOx3. NAD. Normal affect.  Speech and respirations are unlabored.  Accessory  Clinical Findings    None  Assessment & Plan    Primary Cardiologist: Little Ishikawa, MD  Preoperative cardiovascular risk assessment.  Left shoulder reverse arthroplasty by Dr. Aundria Rud.  Chart reviewed as part of pre-operative protocol coverage. According to the RCRI, patient has a 0.4% risk of MACE. Patient reports activity equivalent to >4.0 METS (walking 1.5-2 miles four days a week, performing yard work).   Given past medical history and time since last visit, based on ACC/AHA guidelines, Jacob [name] would be at acceptable risk for the planned procedure without further cardiovascular testing.   Patient was advised that if he develops new symptoms prior to surgery to contact our office to arrange a follow-up appointment.  he verbalized understanding.  2. Anti-coag.  Patient is on Eliquis for history of DVT/PE.  Per Dr. Lubertha Basque last note, Eliquis managed per PCP.  Therefore, recommendations for holding Eliquis prior to surgery  should come from managing provider.  I will route this recommendation to the requesting party via Epic fax function.  Please call with questions.  Time:   Today, I have spent 5 minutes with the patient with telehealth technology discussing medical history, symptoms, and management plan.     Carlos Levering, NP  07/14/2022, 8:22 AM

## 2022-07-16 NOTE — Patient Instructions (Addendum)
SURGICAL WAITING ROOM VISITATION Patients having surgery or a procedure may have no more than 2 support people in the waiting area - these visitors may rotate.    Children under the age of 87 must have an adult with them who is not the patient.  If the patient needs to stay at the hospital during part of their recovery, the visitor guidelines for inpatient rooms apply. Pre-op nurse will coordinate an appropriate time for 1 support person to accompany patient in pre-op.  This support person may not rotate.    Please refer to the [facility] website for the visitor guidelines for Inpatients (after your surgery is over and you are in a regular room).       Your procedure is scheduled on: 08-01-22   Report to [facility] Main Entrance    Report to admitting at 5:15 AM   Call this number if you have problems the morning of surgery [phone]   Do not eat food :After Midnight.   After Midnight you may have the following liquids until 4:15 AM DAY OF SURGERY  Water Non-Citrus Juices (without pulp, NO RED-Apple, White grape, White cranberry) Black Coffee (NO MILK/CREAM OR CREAMERS, sugar ok)  Clear Tea (NO MILK/CREAM OR CREAMERS, sugar ok) regular and decaf                             Plain Jell-O (NO RED)                                           Fruit ices (not with fruit pulp, NO RED)                                     Popsicles (NO RED)                                                               Sports drinks like Gatorade (NO RED)                   The day of surgery:  Drink ONE (1) Pre-Surgery Clear Ensure at 4:15 AM the morning of surgery. Drink in one sitting. Do not sip.  This drink was given to you during your hospital  pre-op appointment visit. Nothing else to drink after completing the Pre-Surgery Clear Ensure.          If you have questions, please contact your surgeon's office.   FOLLOW ANY ADDITIONAL PRE OP INSTRUCTIONS YOU RECEIVED FROM YOUR SURGEON'S  OFFICE!!!     Oral Hygiene is also important to reduce your risk of infection.                                    Remember - BRUSH YOUR TEETH THE MORNING OF SURGERY WITH YOUR REGULAR TOOTHPASTE   Do NOT smoke after Midnight   Take these medicines the morning of surgery with A SIP OF WATER:   Amlodipine  Atorvastatin  Bupropion  Omeprazole  Tamsulosin  Trintellix  Valacyclovir if needed                              You may not have any metal on your body including  jewelry, and body piercing             Do not wear lotions, powders, cologne, or deodorant              Men may shave face and neck.   Do not bring valuables to the hospital. [facility] IS NOT RESPONSIBLE   FOR VALUABLES.   Contacts, dentures or bridgework may not be worn into surgery.   Bring small overnight bag day of surgery.   DO NOT BRING YOUR HOME MEDICATIONS TO THE HOSPITAL. PHARMACY WILL DISPENSE MEDICATIONS LISTED ON YOUR MEDICATION LIST TO YOU DURING YOUR ADMISSION IN THE HOSPITAL!    Special Instructions: Bring a copy of your healthcare power of attorney and living will documents the day of surgery if you haven't scanned them before.              Please read over the following fact sheets you were given: IF YOU HAVE QUESTIONS ABOUT YOUR PRE-OP INSTRUCTIONS PLEASE CALL [phone] Gwen  If you received a COVID test during your pre-op visit  it is requested that you wear a mask when out in public, stay away from anyone that may not be feeling well and notify your surgeon if you develop symptoms. If you test positive for Covid or have been in contact with anyone that has tested positive in the last 10 days please notify you surgeon.    [place]- Preparing for Total Shoulder Arthroplasty    Before surgery, you can play an important role. Because skin is not sterile, your skin needs to be as free of germs as possible. You can reduce the number of germs on your skin by using the following  products. Benzoyl Peroxide Gel Reduces the number of germs present on the skin Applied twice a day to shoulder area starting two days before surgery    ==================================================================  Please follow these instructions carefully:  BENZOYL PEROXIDE 5% GEL  Please do not use if you have an allergy to benzoyl peroxide.   If your skin becomes reddened/irritated stop using the benzoyl peroxide.  Starting two days before surgery, apply as follows: Apply benzoyl peroxide in the morning and at night. Apply after taking a shower. If you are not taking a shower clean entire shoulder front, back, and side along with the armpit with a clean wet washcloth.  Place a quarter-sized dollop on your shoulder and rub in thoroughly, making sure to cover the front, back, and side of your shoulder, along with the armpit.   2 days before ____ AM   ____ PM              1 day before ____ AM   ____ PM                         Do this twice a day for two days.  (Last application is the night before surgery, AFTER using the CHG soap as described below).  Do NOT apply benzoyl peroxide gel on the day of surgery.      Pre-operative 5 CHG Bath Instructions   You can play a key role in reducing the risk of infection after surgery.  Your skin needs to be as free of germs as possible. You can reduce the number of germs on your skin by washing with CHG (chlorhexidine gluconate) soap before surgery. CHG is an antiseptic soap that kills germs and continues to kill germs even after washing.   DO NOT use if you have an allergy to chlorhexidine/CHG or antibacterial soaps. If your skin becomes reddened or irritated, stop using the CHG and notify one of our RNs at  [phone] .   Please shower with the CHG soap starting 4 days before surgery using the following schedule:     Please keep in mind the following:  DO NOT shave, including legs and underarms, starting the day of your first  shower.   You may shave your face at any point before/day of surgery.  Place clean sheets on your bed the day you start using CHG soap. Use a clean washcloth (not used since being washed) for each shower. DO NOT sleep with pets once you start using the CHG.   CHG Shower Instructions:  If you choose to wash your hair and private area, wash first with your normal shampoo/soap.  After you use shampoo/soap, rinse your hair and body thoroughly to remove shampoo/soap residue.  Turn the water OFF and apply about 3 tablespoons (45 ml) of CHG soap to a CLEAN washcloth.  Apply CHG soap ONLY FROM YOUR NECK DOWN TO YOUR TOES (washing for 3-5 minutes)  DO NOT use CHG soap on face, private areas, open wounds, or sores.  Pay special attention to the area where your surgery is being performed.  If you are having back surgery, having someone wash your back for you may be helpful. Wait 2 minutes after CHG soap is applied, then you may rinse off the CHG soap.  Pat dry with a clean towel  Put on clean clothes/pajamas   If you choose to wear lotion, please use ONLY the CHG-compatible lotions on the back of this paper.     Additional instructions for the day of surgery: DO NOT APPLY any lotions, deodorants, cologne, or perfumes.   Put on clean/comfortable clothes.  Brush your teeth.  Ask your nurse before applying any prescription medications to the skin.      CHG Compatible Lotions   Aveeno Moisturizing lotion  Cetaphil Moisturizing Cream  Cetaphil Moisturizing Lotion  Clairol Herbal Essence Moisturizing Lotion, Dry Skin  Clairol Herbal Essence Moisturizing Lotion, Extra Dry Skin  Clairol Herbal Essence Moisturizing Lotion, Normal Skin  Curel Age Defying Therapeutic Moisturizing Lotion with Alpha Hydroxy  Curel Extreme Care Body Lotion  Curel Soothing Hands Moisturizing Hand Lotion  Curel Therapeutic Moisturizing Cream, Fragrance-Free  Curel Therapeutic Moisturizing Lotion, Fragrance-Free  Curel  Therapeutic Moisturizing Lotion, Original Formula  Eucerin Daily Replenishing Lotion  Eucerin Dry Skin Therapy Plus Alpha Hydroxy Crme  Eucerin Dry Skin Therapy Plus Alpha Hydroxy Lotion  Eucerin Original Crme  Eucerin Original Lotion  Eucerin Plus Crme Eucerin Plus Lotion  Eucerin TriLipid Replenishing Lotion  Keri Anti-Bacterial Hand Lotion  Keri Deep Conditioning Original Lotion Dry Skin Formula Softly Scented  Keri Deep Conditioning Original Lotion, Fragrance Free Sensitive Skin Formula  Keri Lotion Fast Absorbing Fragrance Free Sensitive Skin Formula  Keri Lotion Fast Absorbing Softly Scented Dry Skin Formula  Keri Original Lotion  Keri Skin Renewal Lotion Keri Silky Smooth Lotion  Keri Silky Smooth Sensitive Skin Lotion  Nivea Body Creamy Conditioning Oil  Nivea Body Extra Enriched Building services engineer  Sheer Moisturizing Lotion Nivea Crme  Nivea Skin Firming Lotion  NutraDerm 30 Skin Lotion  NutraDerm Skin Lotion  NutraDerm Therapeutic Skin Cream  NutraDerm Therapeutic Skin Lotion  ProShield Protective Hand Cream  Provon moisturizing lotion    Incentive Spirometer  An incentive spirometer is a tool that can help keep your lungs clear and active. This tool measures how well you are filling your lungs with each breath. Taking long deep breaths may help reverse or decrease the chance of developing breathing (pulmonary) problems (especially infection) following: A long period of time when you are unable to move or be active. BEFORE THE PROCEDURE  If the spirometer includes an indicator to show your best effort, your nurse or respiratory therapist will set it to a desired goal. If possible, sit up straight or lean slightly forward. Try not to slouch. Hold the incentive spirometer in an upright position. INSTRUCTIONS FOR USE  Sit on the edge of your bed if possible, or sit up as far as you can in bed or on a chair. Hold the incentive spirometer in  an upright position. Breathe out normally. Place the mouthpiece in your mouth and seal your lips tightly around it. Breathe in slowly and as deeply as possible, raising the piston or the ball toward the top of the column. Hold your breath for 3-5 seconds or for as long as possible. Allow the piston or ball to fall to the bottom of the column. Remove the mouthpiece from your mouth and breathe out normally. Rest for a few seconds and repeat Steps 1 through 7 at least 10 times every 1-2 hours when you are awake. Take your time and take a few normal breaths between deep breaths. The spirometer may include an indicator to show your best effort. Use the indicator as a goal to work toward during each repetition. After each set of 10 deep breaths, practice coughing to be sure your lungs are clear. If you have an incision (the cut made at the time of surgery), support your incision when coughing by placing a pillow or rolled up towels firmly against it. Once you are able to get out of bed, walk around indoors and cough well. You may stop using the incentive spirometer when instructed by your caregiver.  RISKS AND COMPLICATIONS Take your time so you do not get dizzy or light-headed. If you are in pain, you may need to take or ask for pain medication before doing incentive spirometry. It is harder to take a deep breath if you are having pain. AFTER USE Rest and breathe slowly and easily. It can be helpful to keep track of a log of your progress. Your caregiver can provide you with a simple table to help with this. If you are using the spirometer at home, follow these instructions: SEEK MEDICAL CARE IF:  You are having difficultly using the spirometer. You have trouble using the spirometer as often as instructed. Your pain medication is not giving enough relief while using the spirometer. You develop fever of 100.5 F (38.1 C) or higher. SEEK IMMEDIATE MEDICAL CARE IF:  You cough up bloody sputum that  had not been present before. You develop fever of 102 F (38.9 C) or greater. You develop worsening pain at or near the incision site. MAKE SURE YOU:  Understand these instructions. Will watch your condition. Will get help right away if you are not doing well or get worse. Document Released: 07/07/2006 Document Revised: 05/19/2011 Document Reviewed: 09/07/2006 Exit[employer] Patient Information  2014 [employer].   ________________________________________________________________________  PATIENT SIGNATURE_________________________________  NURSE SIGNATURE__________________________________

## 2022-07-16 NOTE — Progress Notes (Addendum)
COVID Vaccine Completed: Yes x3  Date of COVID positive in last 90 days:  No  PCP - Martha Clan, MD Cardiologist - Lewayne Bunting, MD/Timothy Gery Pray, MD in [place] Neurologist - Huston Foley, MD  Cardiac clearance in Epic dated 07-14-22 by Carlos Levering, NP  Chest x-ray - N/A EKG - 04-29-22 Epic Stress Test - N/A ECHO - 04-07-22 [facility] Cardiac Cath - N/A Pacemaker/ICD device last checked: Spinal Cord Stimulator:N/A Coronary CT- 12-31-21 Epic Long Term Monitor - 12-05-21 Epic Penile Implant  Bowel Prep - N/A  Sleep Study - Yes, neg sleep apnea CPAP - N/A  Fasting Blood Sugar - N/A Checks Blood Sugar _____ times a day  Last dose of GLP1 agonist-  N/A GLP1 instructions:  N/A   Last dose of SGLT-2 inhibitors-  N/A SGLT-2 instructions: N/A   Blood Thinner Instructions:  Eliquis (managed by PCP).   Per patient he is to stop 3 days before surgery.  Time:  Takes at 9 AM and 9 PM Aspirin Instructions: N/A Last Dose:  Activity level:  Can go up a flight of stairs and perform activities of daily living without stopping and without symptoms of chest pain or shortness of breath.  Able to exercise without symptoms  Anesthesia review:  Factor V Leidein, HTN, hx of saddle PE, incomplete RBBB, PAC's  Patient denies shortness of breath, fever, cough and chest pain at PAT appointment  Patient verbalized understanding of instructions that were given to them at the PAT appointment. Patient was also instructed that they will need to review over the PAT instructions again at home before surgery.

## 2022-07-21 ENCOUNTER — Encounter (HOSPITAL_COMMUNITY)
Admission: RE | Admit: 2022-07-21 | Discharge: 2022-07-21 | Disposition: A | Discharge status: Home / Self Care | Payer: Medicare Other | Source: Ambulatory Visit | Attending: Orthopedic Surgery | Admitting: Orthopedic Surgery

## 2022-07-21 ENCOUNTER — Other Ambulatory Visit: Payer: Self-pay

## 2022-07-21 ENCOUNTER — Encounter (HOSPITAL_COMMUNITY): Payer: Self-pay

## 2022-07-21 VITALS — BP 133/99 | HR 84 | Temp 98.6°F | Resp 20 | Ht 67.0 in | Wt 180.0 lb

## 2022-07-21 DIAGNOSIS — Z01812 Encounter for preprocedural laboratory examination: Secondary | ICD-10-CM | POA: Insufficient documentation

## 2022-07-21 DIAGNOSIS — I10 Essential (primary) hypertension: Secondary | ICD-10-CM | POA: Insufficient documentation

## 2022-07-21 DIAGNOSIS — Z86718 Personal history of other venous thrombosis and embolism: Secondary | ICD-10-CM | POA: Diagnosis not present

## 2022-07-21 DIAGNOSIS — K219 Gastro-esophageal reflux disease without esophagitis: Secondary | ICD-10-CM | POA: Diagnosis not present

## 2022-07-21 DIAGNOSIS — D6851 Activated protein C resistance: Secondary | ICD-10-CM | POA: Insufficient documentation

## 2022-07-21 DIAGNOSIS — Z86711 Personal history of pulmonary embolism: Secondary | ICD-10-CM | POA: Diagnosis not present

## 2022-07-21 DIAGNOSIS — M129 Arthropathy, unspecified: Secondary | ICD-10-CM | POA: Diagnosis not present

## 2022-07-21 DIAGNOSIS — Z01818 Encounter for other preprocedural examination: Secondary | ICD-10-CM

## 2022-07-21 HISTORY — DX: Other pulmonary embolism without acute cor pulmonale: I26.99

## 2022-07-21 HISTORY — DX: Activated protein C resistance: D68.51

## 2022-07-21 HISTORY — DX: Retinal edema: H35.81

## 2022-07-21 HISTORY — DX: Unspecified malignant neoplasm of skin, unspecified: C44.90

## 2022-07-21 LAB — CBC
HCT: 44.5 % (ref 39.0–52.0)
Hemoglobin: 15.2 g/dL (ref 13.0–17.0)
MCH: 31.2 pg (ref 26.0–34.0)
MCHC: 34.2 g/dL (ref 30.0–36.0)
MCV: 91.4 fL (ref 80.0–100.0)
Platelets: 191 10*3/uL (ref 150–400)
RBC: 4.87 MIL/uL (ref 4.22–5.81)
RDW: 13.1 % (ref 11.5–15.5)
WBC: 4 10*3/uL (ref 4.0–10.5)
nRBC: 0 % (ref 0.0–0.2)

## 2022-07-21 LAB — SURGICAL PCR SCREEN
MRSA, PCR: NEGATIVE
Staphylococcus aureus: NEGATIVE

## 2022-07-21 LAB — BASIC METABOLIC PANEL
Anion gap: 10 (ref 5–15)
BUN: 14 mg/dL (ref 8–23)
CO2: 25 mmol/L (ref 22–32)
Calcium: 9 mg/dL (ref 8.9–10.3)
Chloride: 104 mmol/L (ref 98–111)
Creatinine, Ser: 0.99 mg/dL (ref 0.61–1.24)
GFR, Estimated: >60 mL/min (ref >60)
Glucose, Bld: 100 mg/dL — ABNORMAL HIGH (ref 70–99)
Potassium: 3.8 mmol/L (ref 3.5–5.1)
Sodium: 139 mmol/L (ref 135–145)

## 2022-07-22 DIAGNOSIS — H35413 Lattice degeneration of retina, bilateral: Secondary | ICD-10-CM | POA: Diagnosis not present

## 2022-07-22 DIAGNOSIS — H2513 Age-related nuclear cataract, bilateral: Secondary | ICD-10-CM | POA: Diagnosis not present

## 2022-07-22 DIAGNOSIS — H353221 Exudative age-related macular degeneration, left eye, with active choroidal neovascularization: Secondary | ICD-10-CM | POA: Diagnosis not present

## 2022-07-22 NOTE — Progress Notes (Addendum)
Anesthesia chart review   Case: [id number] Date/Time: 08/01/22 0700   Procedure: REVERSE SHOULDER ARTHROPLASTY (Left: Shoulder) - 115   Anesthesia type: Choice   Pre-op diagnosis: Left shoulder rotator cuff arthropathy   Location: [facility] ROOM 08 / [facility]   Surgeons: Yolonda Kida, MD       DISCUSSION: 72 year old never smoker with history of HTN, GERD, DVT, PE, factor V Leiden, left shoulder OA scheduled for above procedure 08/01/2022 with Dr. Yolonda Kida  Patient seen by cardiology 07/14/2022 for preoperative evaluation.  Per office visit note, "Chart reviewed as part of pre-operative protocol coverage. According to the RCRI, patient has a 0.4% risk of MACE. Patient reports activity equivalent to >4.0 METS (walking 1.5-2 miles four days a week, performing yard work).    Given past medical history and time since last visit, based on ACC/AHA guidelines, Jacob [name] would be at acceptable risk for the planned procedure without further cardiovascular testing."  Patient reports he was advised to hold Eliquis 3 days prior to procedure.  Last dose per patient 07/28/2022 PM.  VS: BP (!) 133/99   Pulse 84   Temp 37 C (Oral)   Resp 20   Ht 5\' 7"  (1.702 m)   Wt 81.6 kg   SpO2 95%   BMI 28.19 kg/m   PROVIDERS: Cleatis Polka., MD is PCP  Primary Cardiologist:  Little Ishikawa, MD  LABS: Labs reviewed: Acceptable for surgery. (all labs ordered are listed, but only abnormal results are displayed)  Labs Reviewed  BASIC METABOLIC PANEL - Abnormal; Notable for the following components:      Result Value   Glucose, Bld 100 (*)    All other components within normal limits  SURGICAL PCR SCREEN  CBC     IMAGES:   EKG:   CV: Echo 12/05/2021  1. Left ventricular ejection fraction, by estimation, is 60 to 65%. The  left ventricle has normal function. The left ventricle has no regional  wall motion abnormalities.   2. Right ventricular systolic function is  normal. The right ventricular  size is normal.   Comparison(s): No significant change from prior study.  Past Medical History:  Diagnosis Date   Depression    DJD (degenerative joint disease)    DVT (deep venous thrombosis) (HCC) 12/04/2021   PE/ Facort V Leiden   ED (erectile dysfunction) of organic origin    Factor V Leiden (HCC)    GERD (gastroesophageal reflux disease)    Hemorrhoids    History of adenomatous polyp of colon    12-11-2014  tubular adenoam, hyperplastic   Hyperlipidemia    Hypertension    Macular edema    Receiving injections   Pulmonary embolism (HCC)    Skin cancer     Past Surgical History:  Procedure Laterality Date   ACHILLES TENDON REPAIR Right 11/2009   ruptured   ANTERIOR CERVICAL DECOMP/DISCECTOMY FUSION  04/23/2009   C7 -- T1   CARPAL TUNNEL RELEASE Bilateral    2020 (4 months apart)   COLONOSCOPY WITH PROPOFOL N/A 12/11/2014   Procedure: COLONOSCOPY WITH PROPOFOL;  Surgeon: Charolett Bumpers, MD;  Location: [facility];  Service: Endoscopy;  Laterality: N/A;   FINGER ARTHRODESIS Right 02/06/2017   Procedure: ARTHRODESIS RIGHT INDEX FINGER DISTAL INTERPHALANGEAL JOINT;  Surgeon: Betha Loa, MD;  Location: [facility];  Service: Orthopedics;  Laterality: Right;   INGUINAL HERNIA REPAIR Bilateral left 03/18/2002/  right -- yrs ago   PENILE  PROSTHESIS IMPLANT N/A 04/03/2016   Procedure: PENILE PROTHESIS INFLATABLE;  Surgeon: Marcine Matar, MD;  Location: [facility];  Service: Urology;  Laterality: N/A;   SHOULDER ARTHROSCOPY W/ ROTATOR CUFF REPAIR Right 2002 and 2012   SHOULDER ARTHROSCOPY WITH ROTATOR CUFF REPAIR AND SUBACROMIAL DECOMPRESSION Right 05/06/2012   Procedure: SHOULDER ARTHROSCOPY WITH ROTATOR CUFF REPAIR AND SUBACROMIAL DECOMPRESSION;  Surgeon: Loreta Ave, MD;  Location: [facility];  Service: Orthopedics;  Laterality: Right;  RIGHT SHOULDER ARTHROSCOPY  lysis of adhesions rotator  cuff repair    SHOULDER ARTHROSCOPY/  DEBRIDEMENT LABRUM AND ROTATOR CUFF PARTIAL TEAR/  RESECTION BURSA/  ACROMIOPLASTY/  CORACOACROMINAL LIGAMENT RELEASE/  DCR Left 12/17/2001   SOFT TISSUE MASS EXCISION  06/25/2006   right forearm-- angiolipoma   TONSILLECTOMY AND ADENOIDECTOMY  child   TOTAL SHOULDER ARTHROPLASTY Right 02/04/2016   ULNAR TUNNEL RELEASE Left    11/2021    MEDICATIONS:  amLODipine (NORVASC) 10 MG tablet   anastrozole (ARIMIDEX) 1 MG tablet   apixaban (ELIQUIS) 5 MG TABS tablet   atorvastatin (LIPITOR) 20 MG tablet   baclofen (LIORESAL) 10 MG tablet   buPROPion (WELLBUTRIN XL) 300 MG 24 hr tablet   furosemide (LASIX) 20 MG tablet   irbesartan (AVAPRO) 300 MG tablet   MAGNESIUM MALATE PO   melatonin 1 MG TABS tablet   Omega-3 Fatty Acids (FISH OIL PO)   omeprazole (PRILOSEC) 40 MG capsule   tamsulosin (FLOMAX) 0.4 MG CAPS capsule   TESTOSTERONE IM   TRINTELLIX 10 MG TABS tablet   valACYclovir (VALTREX) 500 MG tablet   No current facility-administered medications for this encounter.   Jodell Cipro Ward, PA-C [facility] [phone]

## 2022-07-28 ENCOUNTER — Encounter: Payer: Self-pay | Admitting: Oncology

## 2022-07-28 NOTE — Anesthesia Preprocedure Evaluation (Addendum)
Anesthesia Evaluation  Patient identified by MRN, date of birth, ID band Patient awake    Reviewed: Allergy & Precautions, H&P , NPO status , Patient's Chart, lab work & pertinent test results  Airway Mallampati: II  TM Distance: >3 FB Neck ROM: Full    Dental no notable dental hx.    Pulmonary neg pulmonary ROS   Pulmonary exam normal breath sounds clear to auscultation       Cardiovascular hypertension, Normal cardiovascular exam Rhythm:Regular Rate:Normal     Neuro/Psych negative neurological ROS  negative psych ROS   GI/Hepatic Neg liver ROS,GERD  Medicated,,  Endo/Other  negative endocrine ROS    Renal/GU negative Renal ROS  negative genitourinary   Musculoskeletal negative musculoskeletal ROS (+)    Abdominal   Peds negative pediatric ROS (+)  Hematology  (+) Blood dyscrasia   Anesthesia Other Findings   Reproductive/Obstetrics negative OB ROS                             Anesthesia Physical Anesthesia Plan  ASA: 2  Anesthesia Plan: General   Post-op Pain Management: Regional block*   Induction: Intravenous  PONV Risk Score and Plan: 2 and Ondansetron, Dexamethasone and Treatment may vary due to age or medical condition  Airway Management Planned: Oral ETT  Additional Equipment:   Intra-op Plan:   Post-operative Plan: Extubation in OR  Informed Consent: I have reviewed the patients History and Physical, chart, labs and discussed the procedure including the risks, benefits and alternatives for the proposed anesthesia with the patient or authorized representative who has indicated his/her understanding and acceptance.     Dental advisory given  Plan Discussed with: CRNA and Surgeon  Anesthesia Plan Comments: (See PAT note 07/21/2022)       Anesthesia Quick Evaluation

## 2022-07-30 DIAGNOSIS — H40013 Open angle with borderline findings, low risk, bilateral: Secondary | ICD-10-CM | POA: Diagnosis not present

## 2022-07-30 DIAGNOSIS — H353221 Exudative age-related macular degeneration, left eye, with active choroidal neovascularization: Secondary | ICD-10-CM | POA: Diagnosis not present

## 2022-07-30 DIAGNOSIS — H40023 Open angle with borderline findings, high risk, bilateral: Secondary | ICD-10-CM | POA: Diagnosis not present

## 2022-07-30 DIAGNOSIS — H5211 Myopia, right eye: Secondary | ICD-10-CM | POA: Diagnosis not present

## 2022-07-30 DIAGNOSIS — H52223 Regular astigmatism, bilateral: Secondary | ICD-10-CM | POA: Diagnosis not present

## 2022-07-30 DIAGNOSIS — H3530 Unspecified macular degeneration: Secondary | ICD-10-CM | POA: Diagnosis not present

## 2022-07-30 DIAGNOSIS — H40053 Ocular hypertension, bilateral: Secondary | ICD-10-CM | POA: Diagnosis not present

## 2022-07-30 DIAGNOSIS — H53143 Visual discomfort, bilateral: Secondary | ICD-10-CM | POA: Diagnosis not present

## 2022-07-30 DIAGNOSIS — H43392 Other vitreous opacities, left eye: Secondary | ICD-10-CM | POA: Diagnosis not present

## 2022-07-31 ENCOUNTER — Telehealth: Payer: Self-pay | Admitting: Neurology

## 2022-07-31 NOTE — Telephone Encounter (Signed)
[employer]- [employer] no auth req'd   Patient is scheduled at [facility] for 10/14/22 at 9 pm  Mailed packet to the patient.

## 2022-08-01 ENCOUNTER — Observation Stay (HOSPITAL_COMMUNITY)
Admission: RE | Admit: 2022-08-01 | Discharge: 2022-08-02 | Disposition: A | Discharge status: Home / Self Care | Payer: Medicare Other | Source: Ambulatory Visit | Attending: Orthopedic Surgery | Admitting: Orthopedic Surgery

## 2022-08-01 ENCOUNTER — Ambulatory Visit (HOSPITAL_COMMUNITY): Payer: Medicare Other | Admitting: Physician Assistant

## 2022-08-01 ENCOUNTER — Other Ambulatory Visit: Payer: Self-pay

## 2022-08-01 ENCOUNTER — Encounter (HOSPITAL_COMMUNITY): Payer: Self-pay | Admitting: Orthopedic Surgery

## 2022-08-01 ENCOUNTER — Observation Stay (HOSPITAL_COMMUNITY): Payer: Medicare Other

## 2022-08-01 ENCOUNTER — Encounter (HOSPITAL_COMMUNITY)
Admission: RE | Disposition: A | Discharge status: Home / Self Care | Payer: Self-pay | Source: Ambulatory Visit | Attending: Orthopedic Surgery

## 2022-08-01 DIAGNOSIS — I10 Essential (primary) hypertension: Secondary | ICD-10-CM

## 2022-08-01 DIAGNOSIS — Z79899 Other long term (current) drug therapy: Secondary | ICD-10-CM | POA: Insufficient documentation

## 2022-08-01 DIAGNOSIS — S46012A Strain of muscle(s) and tendon(s) of the rotator cuff of left shoulder, initial encounter: Secondary | ICD-10-CM | POA: Diagnosis not present

## 2022-08-01 DIAGNOSIS — Z85828 Personal history of other malignant neoplasm of skin: Secondary | ICD-10-CM | POA: Insufficient documentation

## 2022-08-01 DIAGNOSIS — Z96611 Presence of right artificial shoulder joint: Secondary | ICD-10-CM | POA: Diagnosis not present

## 2022-08-01 DIAGNOSIS — Z96612 Presence of left artificial shoulder joint: Secondary | ICD-10-CM | POA: Diagnosis not present

## 2022-08-01 DIAGNOSIS — M12812 Other specific arthropathies, not elsewhere classified, left shoulder: Secondary | ICD-10-CM | POA: Diagnosis not present

## 2022-08-01 DIAGNOSIS — Z7901 Long term (current) use of anticoagulants: Secondary | ICD-10-CM | POA: Diagnosis not present

## 2022-08-01 DIAGNOSIS — Z471 Aftercare following joint replacement surgery: Secondary | ICD-10-CM | POA: Diagnosis not present

## 2022-08-01 DIAGNOSIS — Z86718 Personal history of other venous thrombosis and embolism: Secondary | ICD-10-CM | POA: Diagnosis not present

## 2022-08-01 DIAGNOSIS — Z86711 Personal history of pulmonary embolism: Secondary | ICD-10-CM | POA: Diagnosis not present

## 2022-08-01 DIAGNOSIS — G8918 Other acute postprocedural pain: Secondary | ICD-10-CM | POA: Diagnosis not present

## 2022-08-01 DIAGNOSIS — M19012 Primary osteoarthritis, left shoulder: Secondary | ICD-10-CM | POA: Diagnosis not present

## 2022-08-01 HISTORY — PX: REVERSE SHOULDER ARTHROPLASTY: SHX5054

## 2022-08-01 LAB — CBC
HCT: 45 % (ref 39.0–52.0)
Hemoglobin: 15.3 g/dL (ref 13.0–17.0)
MCH: 31.6 pg (ref 26.0–34.0)
MCHC: 34 g/dL (ref 30.0–36.0)
MCV: 93 fL (ref 80.0–100.0)
Platelets: 202 10*3/uL (ref 150–400)
RBC: 4.84 MIL/uL (ref 4.22–5.81)
RDW: 13.2 % (ref 11.5–15.5)
WBC: 6.1 10*3/uL (ref 4.0–10.5)
nRBC: 0 % (ref 0.0–0.2)

## 2022-08-01 LAB — CREATININE, SERUM
Creatinine, Ser: 1.04 mg/dL (ref 0.61–1.24)
GFR, Estimated: >60 mL/min (ref >60)

## 2022-08-01 SURGERY — ARTHROPLASTY, SHOULDER, TOTAL, REVERSE
Anesthesia: General | Site: Shoulder | Laterality: Left

## 2022-08-01 MED ORDER — PHENYLEPHRINE HCL (PRESSORS) 10 MG/ML IV SOLN
INTRAVENOUS | Status: AC
  Filled 2022-08-01: qty 1

## 2022-08-01 MED ORDER — LACTATED RINGERS IV SOLN: INTRAVENOUS | Status: DC

## 2022-08-01 MED ORDER — ROCURONIUM BROMIDE 10 MG/ML (PF) SYRINGE
PREFILLED_SYRINGE | INTRAVENOUS | Status: AC
  Filled 2022-08-01: qty 10

## 2022-08-01 MED ORDER — PHENYLEPHRINE 80 MCG/ML (10ML) SYRINGE FOR IV PUSH (FOR BLOOD PRESSURE SUPPORT)
PREFILLED_SYRINGE | INTRAVENOUS | Status: AC
  Filled 2022-08-01: qty 10

## 2022-08-01 MED ORDER — ORAL CARE MOUTH RINSE: 15.0000 mL | Freq: Once | OROMUCOSAL | Status: AC

## 2022-08-01 MED ORDER — BUPIVACAINE HCL (PF) 0.5 % IJ SOLN
INTRAMUSCULAR | Status: DC | PRN
  Administered 2022-08-01: 20 mL via PERINEURAL

## 2022-08-01 MED ORDER — PHENYLEPHRINE HCL (PRESSORS) 10 MG/ML IV SOLN
INTRAVENOUS | Status: DC | PRN
  Administered 2022-08-01 (×5): 160 ug via INTRAVENOUS

## 2022-08-01 MED ORDER — PHENYLEPHRINE HCL-NACL 20-0.9 MG/250ML-% IV SOLN
INTRAVENOUS | Status: DC | PRN
  Administered 2022-08-01: 50 ug/min via INTRAVENOUS

## 2022-08-01 MED ORDER — MIDAZOLAM HCL 2 MG/2ML IJ SOLN
INTRAMUSCULAR | Status: AC
  Filled 2022-08-01: qty 2

## 2022-08-01 MED ORDER — CEFAZOLIN SODIUM-DEXTROSE 2-4 GM/100ML-% IV SOLN
2.0000 g | INTRAVENOUS | Status: AC
  Administered 2022-08-01: 2 g via INTRAVENOUS
  Filled 2022-08-01: qty 100

## 2022-08-01 MED ORDER — VANCOMYCIN HCL 1000 MG IV SOLR
INTRAVENOUS | Status: AC
  Filled 2022-08-01: qty 20

## 2022-08-01 MED ORDER — LIDOCAINE HCL (CARDIAC) PF 100 MG/5ML IV SOSY
PREFILLED_SYRINGE | INTRAVENOUS | Status: DC | PRN
  Administered 2022-08-01: 100 mg via INTRAVENOUS

## 2022-08-01 MED ORDER — ONDANSETRON HCL 4 MG/2ML IJ SOLN
INTRAMUSCULAR | Status: AC
  Filled 2022-08-01: qty 2

## 2022-08-01 MED ORDER — PROPOFOL 10 MG/ML IV BOLUS
INTRAVENOUS | Status: DC | PRN
  Administered 2022-08-01: 200 mg via INTRAVENOUS

## 2022-08-01 MED ORDER — ROCURONIUM BROMIDE 100 MG/10ML IV SOLN
INTRAVENOUS | Status: DC | PRN
  Administered 2022-08-01: 70 mg via INTRAVENOUS

## 2022-08-01 MED ORDER — STERILE WATER FOR IRRIGATION IR SOLN
Status: DC | PRN
  Administered 2022-08-01: 2000 mL

## 2022-08-01 MED ORDER — DOCUSATE SODIUM 100 MG PO CAPS
100.0000 mg | ORAL_CAPSULE | Freq: Two times a day (BID) | ORAL | Status: DC
  Administered 2022-08-01 – 2022-08-02 (×2): 100 mg via ORAL
  Filled 2022-08-01 (×2): qty 1

## 2022-08-01 MED ORDER — HYDROMORPHONE HCL 2 MG PO TABS: 2.0000 mg | ORAL_TABLET | Freq: Two times a day (BID) | ORAL | 0 refills | Status: AC | PRN

## 2022-08-01 MED ORDER — BUPIVACAINE LIPOSOME 1.3 % IJ SUSP
INTRAMUSCULAR | Status: DC | PRN
  Administered 2022-08-01: 10 mL via PERINEURAL

## 2022-08-01 MED ORDER — EPHEDRINE SULFATE (PRESSORS) 50 MG/ML IJ SOLN
INTRAMUSCULAR | Status: DC | PRN
  Administered 2022-08-01: 5 mg via INTRAVENOUS
  Administered 2022-08-01: 10 mg via INTRAVENOUS

## 2022-08-01 MED ORDER — HYDROMORPHONE HCL 1 MG/ML IJ SOLN: 0.5000 mg | INTRAMUSCULAR | Status: DC | PRN

## 2022-08-01 MED ORDER — LIDOCAINE HCL (PF) 2 % IJ SOLN
INTRAMUSCULAR | Status: AC
  Filled 2022-08-01: qty 5

## 2022-08-01 MED ORDER — PHENOL 1.4 % MT LIQD: 1.0000 | OROMUCOSAL | Status: DC | PRN

## 2022-08-01 MED ORDER — TRANEXAMIC ACID-NACL 1000-0.7 MG/100ML-% IV SOLN: 1000.0000 mg | Freq: Once | INTRAVENOUS | Status: DC

## 2022-08-01 MED ORDER — CHLORHEXIDINE GLUCONATE 0.12 % MT SOLN
15.0000 mL | Freq: Once | OROMUCOSAL | Status: AC
  Administered 2022-08-01: 15 mL via OROMUCOSAL

## 2022-08-01 MED ORDER — VANCOMYCIN HCL 1000 MG IV SOLR
INTRAVENOUS | Status: DC | PRN
  Administered 2022-08-01: 1000 mg via TOPICAL

## 2022-08-01 MED ORDER — ONDANSETRON HCL 4 MG/2ML IJ SOLN: 4.0000 mg | Freq: Four times a day (QID) | INTRAMUSCULAR | Status: DC | PRN

## 2022-08-01 MED ORDER — ONDANSETRON HCL 4 MG PO TABS: 4.0000 mg | ORAL_TABLET | Freq: Four times a day (QID) | ORAL | Status: DC | PRN

## 2022-08-01 MED ORDER — AMLODIPINE BESYLATE 5 MG PO TABS
5.0000 mg | ORAL_TABLET | Freq: Every day | ORAL | Status: DC
  Administered 2022-08-02: 5 mg via ORAL
  Filled 2022-08-01: qty 1

## 2022-08-01 MED ORDER — CEFAZOLIN SODIUM-DEXTROSE 1-4 GM/50ML-% IV SOLN
1.0000 g | Freq: Four times a day (QID) | INTRAVENOUS | Status: AC
  Administered 2022-08-01 – 2022-08-02 (×3): 1 g via INTRAVENOUS
  Filled 2022-08-01 (×3): qty 50

## 2022-08-01 MED ORDER — DEXAMETHASONE SODIUM PHOSPHATE 10 MG/ML IJ SOLN
INTRAMUSCULAR | Status: DC | PRN
  Administered 2022-08-01: 10 mg via INTRAVENOUS

## 2022-08-01 MED ORDER — VORTIOXETINE HBR 5 MG PO TABS
10.0000 mg | ORAL_TABLET | Freq: Every day | ORAL | Status: DC
  Administered 2022-08-02: 10 mg via ORAL
  Filled 2022-08-01: qty 2

## 2022-08-01 MED ORDER — SODIUM CHLORIDE 0.9 % IV SOLN: INTRAVENOUS | Status: DC | PRN

## 2022-08-01 MED ORDER — METHOCARBAMOL 500 MG IVPB - SIMPLE MED: 500.0000 mg | Freq: Four times a day (QID) | INTRAVENOUS | Status: DC | PRN

## 2022-08-01 MED ORDER — DEXAMETHASONE SODIUM PHOSPHATE 10 MG/ML IJ SOLN
INTRAMUSCULAR | Status: AC
  Filled 2022-08-01: qty 1

## 2022-08-01 MED ORDER — BACLOFEN 10 MG PO TABS
10.0000 mg | ORAL_TABLET | Freq: Three times a day (TID) | ORAL | Status: DC | PRN
  Administered 2022-08-02: 10 mg via ORAL
  Filled 2022-08-01 (×2): qty 1

## 2022-08-01 MED ORDER — HYDROMORPHONE HCL 2 MG PO TABS: 1.0000 mg | ORAL_TABLET | ORAL | Status: DC | PRN

## 2022-08-01 MED ORDER — ORAL CARE MOUTH RINSE: 15.0000 mL | OROMUCOSAL | Status: DC | PRN

## 2022-08-01 MED ORDER — SUGAMMADEX SODIUM 500 MG/5ML IV SOLN
INTRAVENOUS | Status: DC | PRN
  Administered 2022-08-01: 400 mg via INTRAVENOUS

## 2022-08-01 MED ORDER — METOCLOPRAMIDE HCL 5 MG PO TABS: 5.0000 mg | ORAL_TABLET | Freq: Three times a day (TID) | ORAL | Status: DC | PRN

## 2022-08-01 MED ORDER — ACETAMINOPHEN 500 MG PO TABS
1000.0000 mg | ORAL_TABLET | Freq: Four times a day (QID) | ORAL | Status: AC
  Administered 2022-08-01 – 2022-08-02 (×4): 1000 mg via ORAL
  Filled 2022-08-01 (×4): qty 2

## 2022-08-01 MED ORDER — MIDAZOLAM HCL 5 MG/5ML IJ SOLN
INTRAMUSCULAR | Status: DC | PRN
  Administered 2022-08-01 (×2): 1 mg via INTRAVENOUS

## 2022-08-01 MED ORDER — 0.9 % SODIUM CHLORIDE (POUR BTL) OPTIME
TOPICAL | Status: DC | PRN
  Administered 2022-08-01: 1000 mL

## 2022-08-01 MED ORDER — METHOCARBAMOL 500 MG PO TABS: 500.0000 mg | ORAL_TABLET | Freq: Four times a day (QID) | ORAL | Status: DC | PRN

## 2022-08-01 MED ORDER — ANASTROZOLE 1 MG PO TABS
1.0000 mg | ORAL_TABLET | Freq: Every day | ORAL | Status: DC
  Administered 2022-08-02: 1 mg via ORAL
  Filled 2022-08-01: qty 1

## 2022-08-01 MED ORDER — MAGNESIUM MALATE 1250 (141.7 MG) MG PO TABS: 1350.0000 mg | ORAL_TABLET | Freq: Every morning | ORAL | Status: DC

## 2022-08-01 MED ORDER — MAGNESIUM GLUCONATE 500 MG PO TABS
500.0000 mg | ORAL_TABLET | Freq: Every day | ORAL | Status: DC
  Administered 2022-08-02: 500 mg via ORAL
  Filled 2022-08-01: qty 1

## 2022-08-01 MED ORDER — TRANEXAMIC ACID-NACL 1000-0.7 MG/100ML-% IV SOLN
1000.0000 mg | Freq: Once | INTRAVENOUS | Status: AC
  Administered 2022-08-01: 1000 mg via INTRAVENOUS
  Filled 2022-08-01: qty 100

## 2022-08-01 MED ORDER — ONDANSETRON HCL 4 MG/2ML IJ SOLN
INTRAMUSCULAR | Status: DC | PRN
  Administered 2022-08-01: 4 mg via INTRAVENOUS

## 2022-08-01 MED ORDER — METOCLOPRAMIDE HCL 5 MG/ML IJ SOLN: 5.0000 mg | Freq: Three times a day (TID) | INTRAMUSCULAR | Status: DC | PRN

## 2022-08-01 MED ORDER — FENTANYL CITRATE (PF) 100 MCG/2ML IJ SOLN
INTRAMUSCULAR | Status: AC
  Filled 2022-08-01: qty 2

## 2022-08-01 MED ORDER — ENOXAPARIN SODIUM 40 MG/0.4ML IJ SOSY
40.0000 mg | PREFILLED_SYRINGE | INTRAMUSCULAR | Status: DC
  Administered 2022-08-02: 40 mg via SUBCUTANEOUS
  Filled 2022-08-01: qty 0.4

## 2022-08-01 MED ORDER — TAMSULOSIN HCL 0.4 MG PO CAPS
0.4000 mg | ORAL_CAPSULE | Freq: Every day | ORAL | Status: DC
  Administered 2022-08-02: 0.4 mg via ORAL
  Filled 2022-08-01: qty 1

## 2022-08-01 MED ORDER — APIXABAN 2.5 MG PO TABS: 2.5000 mg | ORAL_TABLET | Freq: Two times a day (BID) | ORAL | 0 refills | Status: DC

## 2022-08-01 MED ORDER — PANTOPRAZOLE SODIUM 40 MG PO TBEC
40.0000 mg | DELAYED_RELEASE_TABLET | Freq: Every day | ORAL | Status: DC
  Administered 2022-08-02: 40 mg via ORAL
  Filled 2022-08-01: qty 1

## 2022-08-01 MED ORDER — PROPOFOL 10 MG/ML IV BOLUS
INTRAVENOUS | Status: AC
  Filled 2022-08-01: qty 20

## 2022-08-01 MED ORDER — MENTHOL 3 MG MT LOZG: 1.0000 | LOZENGE | OROMUCOSAL | Status: DC | PRN

## 2022-08-01 MED ORDER — BUPROPION HCL ER (XL) 300 MG PO TB24
300.0000 mg | ORAL_TABLET | Freq: Every morning | ORAL | Status: DC
  Administered 2022-08-02: 300 mg via ORAL
  Filled 2022-08-01: qty 1

## 2022-08-01 MED ORDER — HYDROMORPHONE HCL 2 MG PO TABS: 2.0000 mg | ORAL_TABLET | ORAL | Status: DC | PRN

## 2022-08-01 MED ORDER — FENTANYL CITRATE (PF) 100 MCG/2ML IJ SOLN
INTRAMUSCULAR | Status: DC | PRN
  Administered 2022-08-01 (×2): 50 ug via INTRAVENOUS

## 2022-08-01 MED ORDER — IRBESARTAN 150 MG PO TABS
300.0000 mg | ORAL_TABLET | Freq: Every morning | ORAL | Status: DC
  Administered 2022-08-02: 300 mg via ORAL
  Filled 2022-08-01: qty 2

## 2022-08-01 MED ORDER — TRANEXAMIC ACID-NACL 1000-0.7 MG/100ML-% IV SOLN
1000.0000 mg | INTRAVENOUS | Status: AC
  Administered 2022-08-01: 1000 mg via INTRAVENOUS
  Filled 2022-08-01: qty 100

## 2022-08-01 MED ORDER — ATORVASTATIN CALCIUM 20 MG PO TABS
20.0000 mg | ORAL_TABLET | Freq: Every morning | ORAL | Status: DC
  Administered 2022-08-02: 20 mg via ORAL
  Filled 2022-08-01: qty 1

## 2022-08-01 SURGICAL SUPPLY — 66 items
AID PSTN UNV HD RSTRNT DISP (MISCELLANEOUS)
BAG COUNTER SPONGE SURGICOUNT (BAG) IMPLANT
BAG SPEC THK2 15X12 ZIP CLS (MISCELLANEOUS) ×1
BAG SPNG CNTER NS LX DISP (BAG)
BAG ZIPLOCK 12X15 (MISCELLANEOUS) ×2 IMPLANT
BIT DRILL FLUTED 3.0 STRL (BIT) IMPLANT
BLADE SAG 18X100X1.27 (BLADE) ×2 IMPLANT
BSPLAT GLND +2X24 MDLR (Joint) ×1 IMPLANT
COOLER ICEMAN CLASSIC (MISCELLANEOUS) ×2 IMPLANT
COVER BACK TABLE 60X90IN (DRAPES) ×2 IMPLANT
COVER SURGICAL LIGHT HANDLE (MISCELLANEOUS) ×2 IMPLANT
CUP SUT UNIV REVERS 39+2 LT (Shoulder) IMPLANT
DRAPE ORTHO SPLIT 77X108 STRL (DRAPES) ×2
DRAPE SHEET LG 3/4 BI-LAMINATE (DRAPES) ×2 IMPLANT
DRAPE SURG 17X11 SM STRL (DRAPES) ×2 IMPLANT
DRAPE SURG ORHT 6 SPLT 77X108 (DRAPES) ×4 IMPLANT
DRAPE TOP 10253 STERILE (DRAPES) ×2 IMPLANT
DRAPE U-SHAPE 47X51 STRL (DRAPES) ×2 IMPLANT
DRSG AQUACEL AG ADV 3.5X 6 (GAUZE/BANDAGES/DRESSINGS) IMPLANT
DRSG AQUACEL AG ADV 3.5X10 (GAUZE/BANDAGES/DRESSINGS) ×2 IMPLANT
DURAPREP 26ML APPLICATOR (WOUND CARE) IMPLANT
ELECT REM PT RETURN 15FT ADLT (MISCELLANEOUS) ×2 IMPLANT
FACESHIELD WRAPAROUND (MASK) ×1 IMPLANT
FACESHIELD WRAPAROUND OR TEAM (MASK) ×2 IMPLANT
GLENOID UNI REV MOD 24 +2 LAT (Joint) IMPLANT
GLENOSPHERE 39+4 LAT/24 UNI RV (Joint) IMPLANT
GLOVE BIO SURGEON STRL SZ7.5 (GLOVE) ×8 IMPLANT
GLOVE BIOGEL PI IND STRL 8 (GLOVE) ×4 IMPLANT
GOWN STRL REUS W/ TWL XL LVL3 (GOWN DISPOSABLE) ×4 IMPLANT
GOWN STRL REUS W/TWL XL LVL3 (GOWN DISPOSABLE) ×2
INSERT HUMERAL M/39 +3/CNSTRND (Miscellaneous) IMPLANT
KIT BASIN OR (CUSTOM PROCEDURE TRAY) ×2 IMPLANT
KIT TURNOVER KIT A (KITS) IMPLANT
MANIFOLD NEPTUNE II (INSTRUMENTS) ×2 IMPLANT
NDL TAPERED W/ NITINOL LOOP (MISCELLANEOUS) IMPLANT
NEEDLE TAPERED W/ NITINOL LOOP (MISCELLANEOUS) IMPLANT
NS IRRIG 1000ML POUR BTL (IV SOLUTION) ×2 IMPLANT
PACK SHOULDER (CUSTOM PROCEDURE TRAY) ×2 IMPLANT
PAD CAST 4YDX4 CTTN HI CHSV (CAST SUPPLIES) ×2 IMPLANT
PAD COLD SHLDR WRAP-ON (PAD) ×2 IMPLANT
PADDING CAST COTTON 4X4 STRL (CAST SUPPLIES) ×1
PIN SET MODULAR GLENOID SYSTEM (PIN) IMPLANT
RESTRAINT HEAD UNIVERSAL NS (MISCELLANEOUS) IMPLANT
SCREW CENTRAL MOD 35 (Screw) IMPLANT
SCREW PERI LOCK 5.5X16 (Screw) IMPLANT
SCREW PERI LOCK 5.5X32 (Screw) IMPLANT
SCREW PERIPHERAL 5.5X20 LOCK (Screw) IMPLANT
SLING ARM FOAM STRAP MED (SOFTGOODS) IMPLANT
SMARTMIX MINI TOWER (MISCELLANEOUS)
SPONGE T-LAP 4X18 ~~LOC~~+RFID (SPONGE) IMPLANT
STEM HUMERAL UNI REVERS SZ6 (Stem) IMPLANT
STRIP CLOSURE SKIN 1/2X4 (GAUZE/BANDAGES/DRESSINGS) ×2 IMPLANT
SUCTION FRAZIER HANDLE 10FR (MISCELLANEOUS) ×1
SUCTION TUBE FRAZIER 10FR DISP (MISCELLANEOUS) ×2 IMPLANT
SUT FIBERWIRE #2 38 T-5 BLUE (SUTURE)
SUT MNCRL AB 3-0 PS2 18 (SUTURE) ×2 IMPLANT
SUT VIC AB 0 CT1 36 (SUTURE) ×2 IMPLANT
SUT VIC AB 1 CT1 36 (SUTURE) ×2 IMPLANT
SUT VIC AB 2-0 CT1 27 (SUTURE) ×1
SUT VIC AB 2-0 CT1 TAPERPNT 27 (SUTURE) ×2 IMPLANT
SUTURE FIBERWR #2 38 T-5 BLUE (SUTURE) IMPLANT
SUTURE TAPE 1.3 40 TPR END (SUTURE) ×2 IMPLANT
SUTURETAPE 1.3 40 TPR END (SUTURE) ×2
TOWEL OR 17X26 10 PK STRL BLUE (TOWEL DISPOSABLE) ×2 IMPLANT
TOWER SMARTMIX MINI (MISCELLANEOUS) IMPLANT
TUBE SUCTION HIGH CAP CLEAR NV (SUCTIONS) ×2 IMPLANT

## 2022-08-01 NOTE — Addendum Note (Signed)
Addendum  created 08/01/22 1147 by Shanon Payor, CRNA   Flowsheet accepted

## 2022-08-01 NOTE — Progress Notes (Signed)
Pacu RN Report to floor given  Gave report to  [employer]. Room: 1331   Discussed surgery, meds given in OR and Pacu, VS, IV fluids given, EBL, urine output, pain and other pertinent information. Also discussed if pt had any family or friends here or belongings with them.   Discussed arm in sling, ice man is on, +CSM, cap refill < 3 sec. No pain, block in place, sensing pressure. No meds given in Pacu. Creatine was drawn in Pacu. Tolerating sips.   Pt exits my care.

## 2022-08-01 NOTE — Brief Op Note (Signed)
08/01/2022  8:38 AM  PATIENT:  Jacob [name]  72 y.o. male  PRE-OPERATIVE DIAGNOSIS:  Left shoulder rotator cuff arthropathy  POST-OPERATIVE DIAGNOSIS:  Left shoulder rotator cuff arthropathy  PROCEDURE:  Procedure(s) with comments: REVERSE SHOULDER ARTHROPLASTY (Left) - 115  SURGEON:  Surgeon(s) and Role:    Yolonda Kida, MD - Primary   ASSISTANTS: Dion Saucier, PA-C   Assistant attestation:  PA McClung present for the entire procedure.  ANESTHESIA:   regional and general  EBL:  250 mL   BLOOD ADMINISTERED:none  DRAINS: none   LOCAL MEDICATIONS USED:  NONE  SPECIMEN:  No Specimen  DISPOSITION OF SPECIMEN:  N/A  COUNTS:  YES  TOURNIQUET:  * No tourniquets in log *  DICTATION: .Note written in EPIC  PLAN OF CARE: Admit for overnight observation  PATIENT DISPOSITION:  PACU - hemodynamically stable.   Delay start of Pharmacological VTE agent (>24hrs) due to surgical blood loss or risk of bleeding: not applicable

## 2022-08-01 NOTE — Anesthesia Procedure Notes (Signed)
Procedure Name: Intubation Date/Time: 08/01/2022 7:21 AM  Performed by: Shanon Payor, CRNAPre-anesthesia Checklist: Patient identified, Emergency Drugs available, Suction available, Patient being monitored and Timeout performed Patient Re-evaluated:Patient Re-evaluated prior to induction Oxygen Delivery Method: Circle system utilized Preoxygenation: Pre-oxygenation with 100% oxygen Induction Type: IV induction Ventilation: Mask ventilation without difficulty Laryngoscope Size: Mac and 3 Grade View: Grade II Tube type: Oral Tube size: 7.5 mm Number of attempts: 1 Airway Equipment and Method: Stylet Placement Confirmation: ETT inserted through vocal cords under direct vision, positive ETCO2, CO2 detector and breath sounds checked- equal and bilateral Secured at: 22 cm Tube secured with: Tape Dental Injury: Teeth and Oropharynx as per pre-operative assessment

## 2022-08-01 NOTE — Anesthesia Procedure Notes (Signed)
Anesthesia Procedure Image    

## 2022-08-01 NOTE — Op Note (Signed)
08/01/2022  8:51 AM  PATIENT:  Jacob [name]    PRE-OPERATIVE DIAGNOSIS:  Left shoulder rotator cuff arthropathy  POST-OPERATIVE DIAGNOSIS:  Same  PROCEDURE:  REVERSE SHOULDER ARTHROPLASTY  SURGEON:  Yolonda Kida, MD  ASSISTANT: Dion Saucier, PA-C  Assistant attestation:  PA Mcclung present for the entire procedure.  ANESTHESIA:   General with interscalene block with Exparel  ESTIMATED BLOOD LOSS: 250 cc  PREOPERATIVE INDICATIONS:  Jacob [name] is a  72 y.o. male with a diagnosis of Left shoulder rotator cuff arthropathy who failed conservative measures and elected for surgical management.    The risks benefits and alternatives were discussed with the patient preoperatively including but not limited to the risks of infection, bleeding, nerve injury, cardiopulmonary complications, the need for revision surgery, dislocation, brachial plexus palsy, incomplete relief of pain, among others, and the patient was willing to proceed.  OPERATIVE IMPLANTS:  Arthrex universe reverse arthroplasty system with size 6 stem and a standard tray with a +3 constrained polyethylene liner  On the glenoid side standard 24 mm +2 lateralized baseplate with a 35 mm central screw and a 39+4 mm glenosphere.  4 peripheral locking screws.   OPERATIVE FINDINGS:  Advanced rotator cuff arthropathy with complete deficiency of the supraspinatus and infraspinatus.  Subscapularis intact as well as teres minor.  Long head of biceps tendon was in a stenotic groove but intact.  It had previously been tenodesis high in the groove and was absent from the joint.  Moderate synovitis.  Bone quality was excellent.  There was some central full-thickness cartilage loss on the humeral head but the glenoid fossa itself was without any arthritis.  OPERATIVE PROCEDURE: The patient was brought to the operating room and placed in the supine position. General anesthesia was administered. IV antibiotics were given. A  Foley was not placed. Time out was performed. The upper extremity was prepped and draped in usual sterile fashion. The patient was in a beachchair position. Deltopectoral approach was carried out. The biceps was tenodesed to the pectoralis tendon with #2 Fiberwire. The subscapularis was released off of the bone.   I then performed circumferential releases of the humerus, and then dislocated the head, and then reamed with the reamer to the above named size.  I then applied the jig, and cut the humeral head in 30 of retroversion, and then turned my attention to the glenoid.  Deep retractors were placed, and I resected the labrum, and then placed a guidepin into the center position on the glenoid, with slight inferior inclination. I then reamed over the guidepin, and this created a small metaphyseal cancellus blush inferiorly, removing just the cartilage to the subchondral bone superiorly. The base plate was selected and impacted place, and then I secured it centrally with a nonlocking screw, and I had excellent purchase both inferiorly and superiorly. I placed a short locking screws on anterior and posterior aspects.  I then turned my attention to the glenosphere, and impacted this into place, and the central setscrew was used to secure to the baseplate.  I sequentially broached, and then trialed, and was found to restore soft tissue tension, and it had 2 finger tightness. Therefore the above named components were selected. The shoulder felt stable throughout functional motion.   I then impacted the real prosthesis into place, as well as the real humeral tray, and reduced the shoulder. The shoulder had excellent motion, and was stable, and I irrigated the wounds copiously.   We utilized  the suture cup suture holes to secure the subscapularis back to the anterior humeral shaft at the lesser tuberosity.  I then irrigated the shoulder copiously once more, placed 1 g of vancomycin powder into the wound,  repaired the deltopectoral interval with #2 FiberWire followed by subcutaneous Vicryl, then monocryl for the skin,  with Steri-Strips and sterile gauze for the skin. The patient was awakened and returned back in stable and satisfactory condition. There no complications and they tolerated the procedure well.  All counts were correct x2.  Disposition:  Jacob [name] will be able to use the left upper extremity immediately for activities of daily living.  No lifting however, over 2 pounds.  He will be in his sling otherwise when sleeping and when out of the house x 2 weeks.  We will admit him for overnight observation.  He will receive Lovenox while in the hospital given his history of VTE/PE.  He will discharge home on 2 weeks of Eliquis and then transition to aspirin for an additional 4 weeks.

## 2022-08-01 NOTE — Discharge Instructions (Addendum)
Orthopedic surgery discharge instructions:  -Maintain postoperative bandage until follow-up appointment.  This is waterproof, and you may begin showering on postoperative day #3.  Do not submerge underwater.  Maintain that bandage until your follow-up appointment in 2 weeks.  -No lifting over 2 pounds with operateive arm.  You may use the arm immediately for activities of daily living such as bathing, washing your face and brushing your teeth, eating, and getting dressed.  Otherwise maintain your sling when you are out of the house and sleeping.  -Apply ice liberally to the shoulder throughout the day.  For mild to moderate pain use Tylenol and Advil as needed around-the-clock.  For breakthrough pain use oxycodone as necessary.  -You will take an 81 mg aspirin once per day x 6 weeks to help prevent blood clots.  -You will return to see Dr. Aundria Rud in the office in 2 weeks for routine postoperative check with x-rays.

## 2022-08-01 NOTE — TOC CM/SW Note (Signed)
  Transition of Care [facility]) Screening Note   Patient Details  Name: Jacob [name] Date of Birth: 19-Apr-1950   Transition of Care [facility]) CM/SW Contact:    Amada Jupiter, LCSW Phone Number: 08/01/2022, 1:58 PM    Transition of Care Department [facility]) has reviewed patient and no TOC needs have been identified at this time. We will continue to monitor patient advancement through interdisciplinary progression rounds. If new patient transition needs arise, please place a TOC consult.

## 2022-08-01 NOTE — Anesthesia Postprocedure Evaluation (Signed)
Anesthesia Post Note  Patient: Jacob [name]  Procedure([name]) Performed: REVERSE SHOULDER ARTHROPLASTY (Left: Shoulder)     Patient location during evaluation: PACU Anesthesia Type: General Level of consciousness: awake and alert Pain management: pain level controlled Vital Signs Assessment: post-procedure vital signs reviewed and stable Respiratory status: spontaneous breathing, nonlabored ventilation, respiratory function stable and patient connected to nasal cannula oxygen Cardiovascular status: blood pressure returned to baseline and stable Postop Assessment: no apparent nausea or vomiting Anesthetic complications: no  No notable events documented.  Last Vitals:  Vitals:   08/01/22 0925 08/01/22 0930  BP:    Pulse: 77 73  Resp: 11 17  Temp:    SpO2: 95% 95%    Last Pain:  Vitals:   08/01/22 0925  TempSrc:   PainSc: 0-No pain                 Jacob [name]

## 2022-08-01 NOTE — Transfer of Care (Signed)
Immediate Anesthesia Transfer of Care Note  Patient: Jacob [name]  Procedure(s) Performed: REVERSE SHOULDER ARTHROPLASTY (Left: Shoulder)  Patient Location: PACU  Anesthesia Type:General  Level of Consciousness: awake, drowsy, and patient cooperative  Airway & Oxygen Therapy: Patient Spontanous Breathing and Patient connected to nasal cannula oxygen  Post-op Assessment: Report given to RN and Post -op Vital signs reviewed and stable  Post vital signs: Reviewed and stable  Last Vitals:  Vitals Value Taken Time  BP 125/79 08/01/22 0855  Temp    Pulse 72 08/01/22 0857  Resp 14 08/01/22 0858  SpO2 94 % 08/01/22 0857  Vitals shown include unvalidated device data.  Last Pain:  Vitals:   08/01/22 0607  TempSrc: Oral  PainSc:       Patients Stated Pain Goal: 4 (08/01/22 0601)  Complications: No notable events documented.

## 2022-08-01 NOTE — Anesthesia Procedure Notes (Signed)
Anesthesia Regional Block: Interscalene brachial plexus block   Pre-Anesthetic Checklist: , timeout performed,  Correct Patient, Correct Site, Correct Laterality,  Correct Procedure, Correct Position, site marked,  Risks and benefits discussed,  Surgical consent,  Pre-op evaluation,  At surgeon's request and post-op pain management  Laterality: Left  Prep: chloraprep       Needles:  Injection technique: Single-shot  Needle Type: Echogenic Stimulator Needle     Needle Length: 9cm      Additional Needles:   Procedures:,,,, ultrasound used (permanent image in chart),,     Nerve Stimulator or Paresthesia:  Response: 0.48 mA  Additional Responses:   Narrative:  Start time: 08/01/2022 6:49 AM End time: 08/01/2022 7:00 AM Injection made incrementally with aspirations every 5 mL.  Performed by: Personally  Anesthesiologist: Eilene Ghazi, MD  Additional Notes: Patient tolerated the procedure well without complications

## 2022-08-01 NOTE — H&P (Signed)
ORTHOPAEDIC H and P_  REQUESTING PHYSICIAN: Yolonda Kida, MD  PCP:  Cleatis Polka., MD  Chief Complaint: Left shoulder rotator cuff arthropathy  HPI: Jacob [name] is a 72 y.o. male who complains of worsening left shoulder functiuon over the last few years.  Here today for reverse arthroplasty left shoulder.  Past Medical History:  Diagnosis Date   Depression    DJD (degenerative joint disease)    DVT (deep venous thrombosis) (HCC) 12/04/2021   PE/ Facort V Leiden   ED (erectile dysfunction) of organic origin    Factor V Leiden (HCC)    GERD (gastroesophageal reflux disease)    Hemorrhoids    History of adenomatous polyp of colon    12-11-2014  tubular adenoam, hyperplastic   Hyperlipidemia    Hypertension    Macular edema    Receiving injections   Pulmonary embolism (HCC)    Skin cancer    Past Surgical History:  Procedure Laterality Date   ACHILLES TENDON REPAIR Right 11/2009   ruptured   ANTERIOR CERVICAL DECOMP/DISCECTOMY FUSION  04/23/2009   C7 -- T1   CARPAL TUNNEL RELEASE Bilateral    2020 (4 months apart)   COLONOSCOPY WITH PROPOFOL N/A 12/11/2014   Procedure: COLONOSCOPY WITH PROPOFOL;  Surgeon: Charolett Bumpers, MD;  Location: [facility];  Service: Endoscopy;  Laterality: N/A;   FINGER ARTHRODESIS Right 02/06/2017   Procedure: ARTHRODESIS RIGHT INDEX FINGER DISTAL INTERPHALANGEAL JOINT;  Surgeon: Betha Loa, MD;  Location: [facility];  Service: Orthopedics;  Laterality: Right;   INGUINAL HERNIA REPAIR Bilateral left 03/18/2002/  right -- yrs ago   PENILE PROSTHESIS IMPLANT N/A 04/03/2016   Procedure: PENILE PROTHESIS INFLATABLE;  Surgeon: Marcine Matar, MD;  Location[facility];  Service: Urology;  Laterality: N/A;   SHOULDER ARTHROSCOPY W/ ROTATOR CUFF REPAIR Right 2002 and 2012   SHOULDER ARTHROSCOPY WITH ROTATOR CUFF REPAIR AND SUBACROMIAL DECOMPRESSION Right 05/06/2012   Procedure: SHOULDER  ARTHROSCOPY WITH ROTATOR CUFF REPAIR AND SUBACROMIAL DECOMPRESSION;  Surgeon: Loreta Ave, MD;  Location: [facility];  Service: Orthopedics;  Laterality: Right;  RIGHT SHOULDER ARTHROSCOPY  lysis of adhesions rotator cuff repair    SHOULDER ARTHROSCOPY/  DEBRIDEMENT LABRUM AND ROTATOR CUFF PARTIAL TEAR/  RESECTION BURSA/  ACROMIOPLASTY/  CORACOACROMINAL LIGAMENT RELEASE/  DCR Left 12/17/2001   SOFT TISSUE MASS EXCISION  06/25/2006   right forearm-- angiolipoma   TONSILLECTOMY AND ADENOIDECTOMY  child   TOTAL SHOULDER ARTHROPLASTY Right 02/04/2016   ULNAR TUNNEL RELEASE Left    11/2021   Social History   Socioeconomic History   Marital status: Married    Spouse name: Not on file   Number of children: Not on file   Years of education: Not on file   Highest education level: Not on file  Occupational History   Not on file  Tobacco Use   Smoking status: Never   Smokeless tobacco: Never  Vaping Use   Vaping Use: Never used  Substance and Sexual Activity   Alcohol use: Yes    Alcohol/week: 14.0 standard drinks of alcohol    Types: 14 Glasses of wine per week    Comment: daily wine, 2-3 per day, no h/o w/d   Drug use: No   Sexual activity: Not on file  Other Topics Concern   Not on file  Social History Narrative   Caffiene 1 cup daily coffee daily   Working: retired.    Social Determinants  of Health   Financial Resource Strain: Not on file  Food Insecurity: No Food Insecurity (06/06/2022)   Hunger Vital Sign    Worried About Running Out of Food in the Last Year: Never true    Ran Out of Food in the Last Year: Never true  Transportation Needs: No Transportation Needs (06/06/2022)   PRAPARE - Administrator, Civil Service (Medical): No    Lack of Transportation (Non-Medical): No  Physical Activity: Not on file  Stress: Not on file  Social Connections: Not on file   Family History  Problem Relation Age of Onset   Parkinsonism Father    Cancer  Sister    Colon cancer Neg Hx    Esophageal cancer Neg Hx    Rectal cancer Neg Hx    Stomach cancer Neg Hx    Allergies  Allergen Reactions   Vancomycin Other (See Comments)    Hypotension    Hydrocodone Other (See Comments)    "jumps out of skin"   Penicillins Other (See Comments)    Unknown childhood reaction   Percocet [Oxycodone-Acetaminophen] Other (See Comments)    "jumps out of skin"   Prior to Admission medications   Medication Sig Start Date End Date Taking? Authorizing Provider  amLODipine (NORVASC) 10 MG tablet Take 5 mg by mouth daily in the afternoon. 11/04/21  Yes [provider]  anastrozole (ARIMIDEX) 1 MG tablet Take 1 mg by mouth daily. 11/01/21  Yes [provider]  apixaban (ELIQUIS) 5 MG TABS tablet Take 1 tablet (5 mg total) by mouth 2 (two) times daily. 05/13/22  Yes Marinus Maw, MD  atorvastatin (LIPITOR) 20 MG tablet Take 20 mg by mouth every morning.    Yes [provider]  baclofen (LIORESAL) 10 MG tablet Take 1 tablet by mouth 3 (three) times daily as needed.   Yes [provider]  buPROPion (WELLBUTRIN XL) 300 MG 24 hr tablet Take 300 mg by mouth every morning.    Yes [provider]  furosemide (LASIX) 20 MG tablet Take 1 tablet (20 mg total) by mouth daily as needed. Only Take AS NEEDED. Patient taking differently: Take 20 mg by mouth daily as needed for fluid. Only Take AS NEEDED. 01/23/22  Yes Marinus Maw, MD  irbesartan (AVAPRO) 300 MG tablet Take 300 mg by mouth every morning.    Yes [provider]  MAGNESIUM MALATE PO Take 1,350 mg by mouth in the morning.   Yes [provider]  melatonin 1 MG TABS tablet Take 13 mg by mouth at bedtime as needed (as needed).   Yes [provider]  Omega-3 Fatty Acids (FISH OIL PO) Take 200 mg by mouth daily.   Yes [provider]  omeprazole (PRILOSEC) 40 MG capsule Take 40 mg by mouth every morning.    Yes [provider]   tamsulosin (FLOMAX) 0.4 MG CAPS capsule Take 0.4 mg by mouth daily. 01/21/21  Yes [provider]  TESTOSTERONE IM Inject 0.3 mLs into the muscle See admin instructions.   Yes [provider]  TRINTELLIX 10 MG TABS tablet Take 10 mg by mouth daily. 10/26/20  Yes [provider]  valACYclovir (VALTREX) 500 MG tablet Take 500 mg by mouth as needed (for flares).   Yes [provider]   No results found.  Positive ROS: All other systems have been reviewed and were otherwise negative with the exception of those mentioned in the HPI and as above.  Physical Exam: General: Alert, no acute distress Cardiovascular: No pedal edema Respiratory: No cyanosis, no use of accessory musculature GI: No organomegaly, abdomen is soft and non-tender Skin: No lesions in the area of chief complaint Neurologic: Sensation intact distally Psychiatric: Patient is competent for consent with normal mood and affect Lymphatic: No axillary or cervical lymphadenopathy  MUSCULOSKELETAL: LUE is wwp, NVI  Assessment: Left shoulder rotator cuff arthropathy  Plan: Proceed today with reverse arthroplasty for the left shoulder  We reviewed risks and benefits, bleeding, infection, fracture, dislocation, stiffness, pain, dvt, and the risk of anesthesia.  He has provided informed consent.  Dc home from PACU    Yolonda Kida, MD Cell [phone]    08/01/2022 6:25 AM

## 2022-08-01 NOTE — Plan of Care (Signed)
  Problem: Education: Goal: Knowledge of the prescribed therapeutic regimen will improve Outcome: Progressing   Problem: Activity: Goal: Ability to tolerate increased activity will improve Outcome: Progressing   Problem: Pain Management: Goal: Pain level will decrease with appropriate interventions Outcome: Progressing   Problem: Safety: Goal: Ability to remain free from injury will improve Outcome: Progressing   

## 2022-08-01 NOTE — Care Plan (Signed)
Ortho Bundle Case Management Note  Patient Details  Name: Jacob [name] MRN: [id number] Date of Birth: 1950/10/25                  L Rev TSA on 08-01-22  DCP: Home with wife  DME: No needs  PT: HEP   DME Arranged:  N/A DME Agency:       Additional Comments: Please contact me with any questions of if this plan should need to change.   Ennis Forts, RN,CCM [facility]  [phone] 08/01/2022, 11:21 AM

## 2022-08-02 DIAGNOSIS — Z85828 Personal history of other malignant neoplasm of skin: Secondary | ICD-10-CM | POA: Diagnosis not present

## 2022-08-02 DIAGNOSIS — Z86718 Personal history of other venous thrombosis and embolism: Secondary | ICD-10-CM | POA: Diagnosis not present

## 2022-08-02 DIAGNOSIS — M12812 Other specific arthropathies, not elsewhere classified, left shoulder: Secondary | ICD-10-CM | POA: Diagnosis not present

## 2022-08-02 DIAGNOSIS — I10 Essential (primary) hypertension: Secondary | ICD-10-CM | POA: Diagnosis not present

## 2022-08-02 DIAGNOSIS — Z96611 Presence of right artificial shoulder joint: Secondary | ICD-10-CM | POA: Diagnosis not present

## 2022-08-02 DIAGNOSIS — Z86711 Personal history of pulmonary embolism: Secondary | ICD-10-CM | POA: Diagnosis not present

## 2022-08-02 LAB — BASIC METABOLIC PANEL
Anion gap: 9 (ref 5–15)
BUN: 17 mg/dL (ref 8–23)
CO2: 25 mmol/L (ref 22–32)
Calcium: 8.6 mg/dL — ABNORMAL LOW (ref 8.9–10.3)
Chloride: 104 mmol/L (ref 98–111)
Creatinine, Ser: 1.11 mg/dL (ref 0.61–1.24)
GFR, Estimated: >60 mL/min (ref >60)
Glucose, Bld: 221 mg/dL — ABNORMAL HIGH (ref 70–99)
Potassium: 3.8 mmol/L (ref 3.5–5.1)
Sodium: 138 mmol/L (ref 135–145)

## 2022-08-02 LAB — HEMOGLOBIN AND HEMATOCRIT, BLOOD
HCT: 39 % (ref 39.0–52.0)
Hemoglobin: 13.7 g/dL (ref 13.0–17.0)

## 2022-08-02 NOTE — Progress Notes (Signed)
   Subjective: 1 Day Post-Op Procedure(s) (LRB): REVERSE SHOULDER ARTHROPLASTY (Left)  Pt doing well Arm still numb from the block Denies any symptoms overnight Ready for d/c home Patient reports pain as none.  Objective:   VITALS:   Vitals:   08/02/22 0120 08/02/22 0546  BP: 116/84 129/84  Pulse: 80 72  Resp: 17 17  Temp: 97.8 F (36.6 C) 98 F (36.7 C)  SpO2: 96% 97%    Left shoulder dressing intact  and sling in good position Numbness due to block but able to move his hand/fingers No signs of drainage or infection Nv intact distally  LABS Recent Labs    08/01/22 1046 08/02/22 0334  HGB 15.3 13.7  HCT 45.0 39.0  WBC 6.1  --   PLT 202  --     Recent Labs    08/01/22 1046 08/02/22 0334  NA  --  138  K  --  3.8  BUN  --  17  CREATININE 1.04 1.11  GLUCOSE  --  221*     Assessment/Plan: 1 Day Post-Op Procedure(s) (LRB): REVERSE SHOULDER ARTHROPLASTY (Left) D/c home today F/u in 2 weeks in the office Sling for support as needed Pain management     Brad Antonieta Iba, MPAS [facility] is now [facility]  [place] [address], [place], [place] [address] Phone: [phone] [email or web address] Facebook  [employer]

## 2022-08-02 NOTE — Progress Notes (Signed)
Pt alert and oriented. Surgical dressing clean , dry and intact. No queries or questions regarding discharge instructions. Belongings sent home with pt.

## 2022-08-02 NOTE — Discharge Summary (Signed)
In most cases prophylactic antibiotics for Dental procdeures after total joint surgery are not necessary.  Exceptions are as follows:  1. History of prior total joint infection  2. Severely immunocompromised (Organ Transplant, cancer chemotherapy, Rheumatoid biologic meds such as Humera)  3. Poorly controlled diabetes (A1C &gt; 8.0, blood glucose over 200)  If you have one of these conditions, contact your surgeon for an antibiotic prescription, prior to your dental procedure. Orthopedic Discharge Summary        Physician Discharge Summary  Patient ID: Jacob [name] MRN: [id number] DOB/AGE: 06/15/1950 72 y.o.  Admit date: 08/01/2022 Discharge date: 08/02/2022   Procedures:  Procedure(s) (LRB): REVERSE SHOULDER ARTHROPLASTY (Left)  Attending Physician:  Dr. Aundria Rud  Admission Diagnoses:   left shoulder cuff arthropathy  Discharge Diagnoses:  left shoulder cuff arthropathy   Past Medical History:  Diagnosis Date   Depression    DJD (degenerative joint disease)    DVT (deep venous thrombosis) (HCC) 12/04/2021   PE/ Facort V Leiden   ED (erectile dysfunction) of organic origin    Factor V Leiden (HCC)    GERD (gastroesophageal reflux disease)    Hemorrhoids    History of adenomatous polyp of colon    12-11-2014  tubular adenoam, hyperplastic   Hyperlipidemia    Hypertension    Macular edema    Receiving injections   Pulmonary embolism (HCC)    Skin cancer     PCP: Cleatis Polka., MD   Discharged Condition: good  Hospital Course:  Patient underwent the above stated procedure on 08/01/2022. Patient tolerated the procedure well and brought to the recovery room in good condition and subsequently to the floor. Patient had an uncomplicated hospital course and was stable for discharge.   Disposition: Discharge disposition: 01-Home or Self Care      with follow up in 2 weeks    Follow-up Information     Yolonda Kida, MD. Schedule an  appointment as soon as possible for a visit in 2 week(s).   Specialty: Orthopedic Surgery Why: For wound re-check Contact information: [address] [place] [address] [phone]                 Dental Antibiotics:  In most cases prophylactic antibiotics for Dental procdeures after total joint surgery are not necessary.  Exceptions are as follows:  1. History of prior total joint infection  2. Severely immunocompromised (Organ Transplant, cancer chemotherapy, Rheumatoid biologic meds such as Humera)  3. Poorly controlled diabetes (A1C &gt; 8.0, blood glucose over 200)  If you have one of these conditions, contact your surgeon for an antibiotic prescription, prior to your dental procedure.  Discharge Instructions     Call MD / Call 911   Complete by: As directed    If you experience chest pain or shortness of breath, CALL 911 and be transported to the hospital emergency room.  If you develope a fever above 101 F, pus (white drainage) or increased drainage or redness at the wound, or calf pain, call your surgeon's office.   Constipation Prevention   Complete by: As directed    Drink plenty of fluids.  Prune juice may be helpful.  You may use a stool softener, such as Colace (over the counter) 100 mg twice a day.  Use MiraLax (over the counter) for constipation as needed.   Diet - low sodium heart healthy   Complete by: As directed    Increase activity slowly as  tolerated   Complete by: As directed    Post-operative opioid taper instructions:   Complete by: As directed    POST-OPERATIVE OPIOID TAPER INSTRUCTIONS: It is important to wean off of your opioid medication as soon as possible. If you do not need pain medication after your surgery it is ok to stop day one. Opioids include: Codeine, Hydrocodone(Norco, Vicodin), Oxycodone(Percocet, oxycontin) and hydromorphone amongst others.  Long term and even short term use of opiods can cause: Increased  pain response Dependence Constipation Depression Respiratory depression And more.  Withdrawal symptoms can include Flu like symptoms Nausea, vomiting And more Techniques to manage these symptoms Hydrate well Eat regular healthy meals Stay active Use relaxation techniques(deep breathing, meditating, yoga) Do Not substitute Alcohol to help with tapering If you have been on opioids for less than two weeks and do not have pain than it is ok to stop all together.  Plan to wean off of opioids This plan should start within one week post op of your joint replacement. Maintain the same interval or time between taking each dose and first decrease the dose.  Cut the total daily intake of opioids by one tablet each day Next start to increase the time between doses. The last dose that should be eliminated is the evening dose.          Allergies as of 08/02/2022       Reactions   Vancomycin Other (See Comments)   Hypotension    Hydrocodone Other (See Comments)   "jumps out of skin"   Penicillins Other (See Comments)   Unknown childhood reaction   Percocet [oxycodone-acetaminophen] Other (See Comments)   "jumps out of skin"        Medication List     TAKE these medications    amLODipine 10 MG tablet Commonly known as: NORVASC Take 5 mg by mouth daily in the afternoon.   anastrozole 1 MG tablet Commonly known as: ARIMIDEX Take 1 mg by mouth daily.   apixaban 5 MG Tabs tablet Commonly known as: ELIQUIS Take 1 tablet (5 mg total) by mouth 2 (two) times daily. What changed: Another medication with the same name was added. Make sure you understand how and when to take each.   apixaban 2.5 MG Tabs tablet Commonly known as: Eliquis Take 1 tablet (2.5 mg total) by mouth 2 (two) times daily for 14 days. What changed: You were already taking a medication with the same name, and this prescription was added. Make sure you understand how and when to take each.   atorvastatin 20  MG tablet Commonly known as: LIPITOR Take 20 mg by mouth every morning.   baclofen 10 MG tablet Commonly known as: LIORESAL Take 1 tablet by mouth 3 (three) times daily as needed.   buPROPion 300 MG 24 hr tablet Commonly known as: WELLBUTRIN XL Take 300 mg by mouth every morning.   FISH OIL PO Take 200 mg by mouth daily.   furosemide 20 MG tablet Commonly known as: LASIX Take 1 tablet (20 mg total) by mouth daily as needed. Only Take AS NEEDED. What changed: reasons to take this   HYDROmorphone 2 MG tablet Commonly known as: Dilaudid Take 1 tablet (2 mg total) by mouth every 12 (twelve) hours as needed for up to 5 days for severe pain.   irbesartan 300 MG tablet Commonly known as: AVAPRO Take 300 mg by mouth every morning.   MAGNESIUM MALATE PO Take 1,350 mg by mouth in the morning.  melatonin 1 MG Tabs tablet Take 13 mg by mouth at bedtime as needed (as needed).   omeprazole 40 MG capsule Commonly known as: PRILOSEC Take 40 mg by mouth every morning.   tamsulosin 0.4 MG Caps capsule Commonly known as: FLOMAX Take 0.4 mg by mouth daily.   TESTOSTERONE IM Inject 0.3 mLs into the muscle See admin instructions.   Trintellix 10 MG Tabs tablet Generic drug: vortioxetine HBr Take 10 mg by mouth daily.   valACYclovir 500 MG tablet Commonly known as: VALTREX Take 500 mg by mouth as needed (for flares).          Signed: Thea Gist 08/02/2022, 7:51 AM  [facility] is now [facility] [address], [address], [place], [place] [address] Phone: [phone] Facebook  Instagram  [employer]

## 2022-08-02 NOTE — Plan of Care (Signed)
Problem: Education: Goal: Knowledge of the prescribed therapeutic regimen will improve Outcome: Progressing Goal: Understanding of activity limitations/precautions following surgery will improve Outcome: Progressing   Problem: Activity: Goal: Ability to tolerate increased activity will improve Outcome: Progressing   

## 2022-08-02 NOTE — Evaluation (Addendum)
Occupational Therapy Evaluation Patient Details Name: Jacob [name] MRN: [id number] DOB: 10-10-1950 Today's Date: 08/02/2022   History of Present Illness Patient is a 72 year old male who presented with left rotator cuff arthropathy with failed conservative measures. Patient underwent left reverse total shoulder arthroplasty. PMH: DJD, depression, skin cancer, achilles tendon repair, anterior cervical decompression/discectomy fusion C7-T1, bilateral carpal tunnel release, macular edema, L ulnar tunnel release.   Clinical Impression   Patient is a 72 year old male who s/p shoulder replacement without functional use of left upper extremity secondary to effects of surgery and interscalene block and shoulder precautions. Therapist provided education and instruction to patient in regards to exercises, precautions, positioning, donning upper extremity clothing and bathing while maintaining shoulder precautions, ice and edema management and donning/doffing sling. Patient verbalized understanding and demonstrated as needed. Patient needed assistance to donn shirt, underwear, pants, socks and shoes and provided with instruction on compensatory strategies to perform ADLs. Patient to follow up with MD for further therapy needs.        Recommendations for follow up therapy are one component of a multi-disciplinary discharge planning process, led by the attending physician.  Recommendations may be updated based on patient status, additional functional criteria and insurance authorization.   Assistance Recommended at Discharge PRN  Patient can return home with the following Assistance with cooking/housework;A little help with bathing/dressing/bathroom;Assist for transportation    Functional Status Assessment  Patient has not had a recent decline in their functional status  Equipment Recommendations  None recommended by OT       Precautions / Restrictions Precautions Precautions: Shoulder Type of  Shoulder Precautions: shoulder ROM FF 0-90, ER 0-30, no abduction. ok for hand wrist and elbow ROM. Shoulder Interventions: Shoulder sling/immobilizer;Off for dressing/bathing/exercises;At all times Precaution Booklet Issued: Yes (comment) (handout) Restrictions Weight Bearing Restrictions: Yes LUE Weight Bearing: Non weight bearing       Mobility Bed Mobility Overal bed mobility: Modified Independent        Transfers Overall transfer level: Modified independent          Balance Overall balance assessment: No apparent balance deficits (not formally assessed)               ADL either performed or assessed with clinical judgement   ADL Overall ADL's : At baseline       General ADL Comments: patient has had multiple shoulder surgeries prior to this one. patient needed min A to don button up shirt (buttons), don jeans (zipper and button/ belt) and sling with patient having difficult time with waist strap. patient was educated on how to avoid waist strap, patient reported he wanted to wear it for now. patient was able to teach back all eduation during session. patient reported wife would be present to assist as needed at home                  Pertinent Vitals/Pain Pain Assessment Pain Assessment: No/denies pain     Hand Dominance     Extremity/Trunk Assessment Upper Extremity Assessment Upper Extremity Assessment: LUE deficits/detail LUE Deficits / Details: wiggle digits minimally, unable to control movement of wrist, elbow or shoulder at this time   Lower Extremity Assessment Lower Extremity Assessment: Overall WFL for tasks assessed   Cervical / Trunk Assessment Cervical / Trunk Assessment: Normal   Communication     Cognition Arousal/Alertness: Awake/alert Behavior During Therapy: WFL for tasks assessed/performed Overall Cognitive Status: Within Functional Limits for tasks assessed  Shoulder Instructions Shoulder Instructions Donning/doffing shirt without moving shoulder: Minimal assistance Method for sponge bathing under operated UE: Modified independent Donning/doffing sling/immobilizer: Minimal assistance;Patient able to independently direct caregiver Correct positioning of sling/immobilizer: Modified independent ROM for elbow, wrist and digits of operated UE: Modified independent Sling wearing schedule (on at all times/off for ADL's): Modified independent Proper positioning of operated UE when showering: Modified independent Positioning of UE while sleeping: Modified independent    Home Living Family/patient expects to be discharged to:: Private residence Living Arrangements: Spouse/significant other Available Help at Discharge: Family;Available 24 hours/day        Prior Functioning/Environment Prior Level of Function : Independent/Modified Independent            OT Problem List: Impaired UE functional use      OT Treatment/Interventions:      OT Goals(Current goals can be found in the care plan section) Acute Rehab OT Goals Patient Stated Goal: to go home OT Goal Formulation: All assessment and education complete, DC therapy Time For Goal Achievement: 08/16/22 Potential to Achieve Goals: Fair  OT Frequency:         AM-PAC OT "6 Clicks" Daily Activity     Outcome Measure Help from another person eating meals?: None Help from another person taking care of personal grooming?: None Help from another person toileting, which includes using toliet, bedpan, or urinal?: A Little Help from another person bathing (including washing, rinsing, drying)?: A Little Help from another person to put on and taking off regular upper body clothing?: A Little Help from another person to put on and taking off regular lower body clothing?: A Little 6 Click Score: 20   End of Session Equipment Utilized During Treatment: Other (comment) (sling) Nurse Communication:  Mobility status  Activity Tolerance: Patient tolerated treatment well Patient left: in chair;with call bell/phone within reach  OT Visit Diagnosis: Pain Pain - Right/Left: Left Pain - part of body: Shoulder                Time: 1610-9604 OT Time Calculation (min): 17 min Charges:  OT General Charges $OT Visit: 1 Visit OT Evaluation $OT Eval Low Complexity: 1 Low  Suleiman Finigan OTR/L, MS Acute Rehabilitation Department Office# [phone]   Selinda Flavin 08/02/2022, 9:51 AM

## 2022-08-05 ENCOUNTER — Other Ambulatory Visit: Payer: Medicare Other

## 2022-08-05 ENCOUNTER — Ambulatory Visit: Payer: Medicare Other | Admitting: Oncology

## 2022-08-06 ENCOUNTER — Encounter (HOSPITAL_COMMUNITY): Payer: Self-pay | Admitting: Orthopedic Surgery

## 2022-08-06 DIAGNOSIS — C44319 Basal cell carcinoma of skin of other parts of face: Secondary | ICD-10-CM | POA: Diagnosis not present

## 2022-08-06 DIAGNOSIS — Z85828 Personal history of other malignant neoplasm of skin: Secondary | ICD-10-CM | POA: Diagnosis not present

## 2022-08-07 ENCOUNTER — Other Ambulatory Visit: Payer: Self-pay | Admitting: Oncology

## 2022-08-07 ENCOUNTER — Other Ambulatory Visit: Payer: Self-pay

## 2022-08-07 DIAGNOSIS — D6859 Other primary thrombophilia: Secondary | ICD-10-CM

## 2022-08-07 DIAGNOSIS — I82401 Acute embolism and thrombosis of unspecified deep veins of right lower extremity: Secondary | ICD-10-CM

## 2022-08-07 DIAGNOSIS — I2692 Saddle embolus of pulmonary artery without acute cor pulmonale: Secondary | ICD-10-CM

## 2022-08-07 NOTE — Progress Notes (Signed)
[facility] Cancer Follow up Visit:  Patient Care Team: Cleatis Polka., MD as PCP - General (Internal Medicine) Little Ishikawa, MD as PCP - Cardiology (Cardiology)  CHIEF COMPLAINTS/PURPOSE OF CONSULTATION: HISTORY OF PRESENTING ILLNESS: Jacob [name] 72 y.o. male is here because of VTE Medical history notable for depression, DJD, ED, GERD, hemorrhoids, colon polyps (adenomatous and hyperplastic) hyperlipidemia, hypertension  December 04, 2021: Presented to emergency room with shortness of breath and dyspnea on exertion as well as right ankle edema which had worsened over the previous day  History was notable for an episode of atrial fibrillation noted on his Apple Watch for which she was placed on Eliquis.  This has been stopped by cardiology after 4 to 5 days because a new strip showed NSR with frequent PACs.  On November 29, 2020.  Contacted cardiology because of intermittent lightheadedness and shortness of breath He had had elbow surgery 2 weeks prior  CTPA-saddle embolism with bilateral pulmonary emboli, largest burden in right lower lobe.  No evidence of right heart strain. CT abdomen pelvis showed no acute findings  Right lower extremity ultrasound showed occlusive DVT involving right common femoral vein, femoral and popliteal veins  Labs on admission showed a normal BMP. WBC 6.3 hemoglobin 16.7 platelet count 151; 70 segs 16 lymphs 12 monos 1 EO 1 basophil. INR 1.1  He was treated with IV heparin and transition to Eliquis.  June 06 2022:  [facility] Hematology Consult Patient had been on Testosterone injections for years prior to the incident.  His urologist has elected to continue it.    Social:  Married.  Retired owned Research scientist (medical).  Tobacco none.  EtOH 2 drinks per day  [facility] Mother died 83 possibly from VTE Father died 62 Parkinson's disease Sister alive 61 melanoma  Sister alive 32 obesity  WBC 3.5 hemoglobin 17.5 platelet  count 212; 50 segs 31 lymphs 16 monos 1 EO 1 basophil Flow for PNH negative Patient heterozygous for factor V Leiden gene mutation prothrombin gene mutation not detected D-dimer 0.67 fibrinogen 336 INR 1.1 PTT 31 Anticardiolipin antibody and antibeta 2 glycoprotein negative. ANA panel negative.  Rheumatoid factor negative.  PSA 2.8 Testosterone 820 CMP normal  June 24 2022: Scheduled follow-up regarding VTE.  Reviewed results of labs with patient and his wife.   Telephone visit.  Patient states that three years ago he was noted to have an elevated Hgb level which has been treated with blood donation.  The use of testosterone has well preceded this.  Patient snores loudly and is to go for a sleep study later this month.    Recommend that he decrease testosterone dosing Agree with sleep study because OSA is a risk factor for polycythemia and VTE (Sleep study to be done by [facility]) Continue full dose anticoagulation  Aug 01 2022:  Reverse left total shoulder arthroplasty  Aug 08 2022:  Scheduled follow-up regarding VTE.  Patient states that shoulder surgery went well.  He is also s/p Mohs surgery to left mandibular area for squamous cell carcinoma.  Has not yet undergone sleep study and will not likely be done until August at least. Cut testosterone back to 3/10ths of a cc q 8 days and can tell the difference but it is OK.  Remains on Eliquis 5 mg bid.   His three adult children are going to be tested for FV Leiden.   Hgb 14.2 PLT 263   Review of Systems - Oncology  MEDICAL HISTORY: Past Medical History:  Diagnosis Date   Depression    DJD (degenerative joint disease)    DVT (deep venous thrombosis) (HCC) 12/04/2021   PE/ Facort V Leiden   ED (erectile dysfunction) of organic origin    Factor V Leiden (HCC)    GERD (gastroesophageal reflux disease)    Hemorrhoids    History of adenomatous polyp of colon    12-11-2014  tubular adenoam, hyperplastic   Hyperlipidemia     Hypertension    Macular edema    Receiving injections   Pulmonary embolism (HCC)    Skin cancer     SURGICAL HISTORY: Past Surgical History:  Procedure Laterality Date   ACHILLES TENDON REPAIR Right 11/2009   ruptured   ANTERIOR CERVICAL DECOMP/DISCECTOMY FUSION  04/23/2009   C7 -- T1   CARPAL TUNNEL RELEASE Bilateral    2020 (4 months apart)   COLONOSCOPY WITH PROPOFOL N/A 12/11/2014   Procedure: COLONOSCOPY WITH PROPOFOL;  Surgeon: Charolett Bumpers, MD;  Location: [facility];  Service: Endoscopy;  Laterality: N/A;   FINGER ARTHRODESIS Right 02/06/2017   Procedure: ARTHRODESIS RIGHT INDEX FINGER DISTAL INTERPHALANGEAL JOINT;  Surgeon: Betha Loa, MD;  Location: [facility];  Service: Orthopedics;  Laterality: Right;   INGUINAL HERNIA REPAIR Bilateral left 03/18/2002/  right -- yrs ago   PENILE PROSTHESIS IMPLANT N/A 04/03/2016   Procedure: PENILE PROTHESIS INFLATABLE;  Surgeon: Marcine Matar, MD;  Location: [facility];  Service: Urology;  Laterality: N/A;   REVERSE SHOULDER ARTHROPLASTY Left 08/01/2022   Procedure: REVERSE SHOULDER ARTHROPLASTY;  Surgeon: Yolonda Kida, MD;  Location: [facility];  Service: Orthopedics;  Laterality: Left;  115   SHOULDER ARTHROSCOPY W/ ROTATOR CUFF REPAIR Right 2002 and 2012   SHOULDER ARTHROSCOPY WITH ROTATOR CUFF REPAIR AND SUBACROMIAL DECOMPRESSION Right 05/06/2012   Procedure: SHOULDER ARTHROSCOPY WITH ROTATOR CUFF REPAIR AND SUBACROMIAL DECOMPRESSION;  Surgeon: Loreta Ave, MD;  Location:  SURGERY CENTER;  Service: Orthopedics;  Laterality: Right;  RIGHT SHOULDER ARTHROSCOPY  lysis of adhesions rotator cuff repair    SHOULDER ARTHROSCOPY/  DEBRIDEMENT LABRUM AND ROTATOR CUFF PARTIAL TEAR/  RESECTION BURSA/  ACROMIOPLASTY/  CORACOACROMINAL LIGAMENT RELEASE/  DCR Left 12/17/2001   SOFT TISSUE MASS EXCISION  06/25/2006   right forearm-- angiolipoma   TONSILLECTOMY AND ADENOIDECTOMY  child    TOTAL SHOULDER ARTHROPLASTY Right 02/04/2016   ULNAR TUNNEL RELEASE Left    11/2021    SOCIAL HISTORY: Social History   Socioeconomic History   Marital status: Married    Spouse name: Not on file   Number of children: Not on file   Years of education: Not on file   Highest education level: Not on file  Occupational History   Not on file  Tobacco Use   Smoking status: Never   Smokeless tobacco: Never  Vaping Use   Vaping Use: Never used  Substance and Sexual Activity   Alcohol use: Yes    Alcohol/week: 14.0 standard drinks of alcohol    Types: 14 Glasses of wine per week    Comment: daily wine, 2-3 per day, no h/o w/d   Drug use: No   Sexual activity: Not on file  Other Topics Concern   Not on file  Social History Narrative   Caffiene 1 cup daily coffee daily   Working: retired.    Social Determinants of Health   Financial Resource Strain: Not on file  Food Insecurity: No Food Insecurity (08/01/2022)   Hunger  Vital Sign    Worried About Programme researcher, broadcasting/film/video in the Last Year: Never true    Ran Out of Food in the Last Year: Never true  Transportation Needs: No Transportation Needs (08/01/2022)   PRAPARE - Administrator, Civil Service (Medical): No    Lack of Transportation (Non-Medical): No  Physical Activity: Not on file  Stress: Not on file  Social Connections: Not on file  Intimate Partner Violence: Not At Risk (08/01/2022)   Humiliation, Afraid, Rape, and Kick questionnaire    Fear of Current or Ex-Partner: No    Emotionally Abused: No    Physically Abused: No    Sexually Abused: No    FAMILY HISTORY Family History  Problem Relation Age of Onset   Parkinsonism Father    Cancer Sister    Colon cancer Neg Hx    Esophageal cancer Neg Hx    Rectal cancer Neg Hx    Stomach cancer Neg Hx     ALLERGIES:  is allergic to vancomycin, hydrocodone, penicillins, and percocet [oxycodone-acetaminophen].  MEDICATIONS:  Current Outpatient Medications   Medication Sig Dispense Refill   amLODipine (NORVASC) 10 MG tablet Take 5 mg by mouth daily in the afternoon.     anastrozole (ARIMIDEX) 1 MG tablet Take 1 mg by mouth daily.     apixaban (ELIQUIS) 2.5 MG TABS tablet Take 1 tablet (2.5 mg total) by mouth 2 (two) times daily for 14 days. 28 tablet 0   apixaban (ELIQUIS) 5 MG TABS tablet Take 1 tablet (5 mg total) by mouth 2 (two) times daily. 90 tablet 7   atorvastatin (LIPITOR) 20 MG tablet Take 20 mg by mouth every morning.      baclofen (LIORESAL) 10 MG tablet Take 1 tablet by mouth 3 (three) times daily as needed.     buPROPion (WELLBUTRIN XL) 300 MG 24 hr tablet Take 300 mg by mouth every morning.      furosemide (LASIX) 20 MG tablet Take 1 tablet (20 mg total) by mouth daily as needed. Only Take AS NEEDED. (Patient taking differently: Take 20 mg by mouth daily as needed for fluid. Only Take AS NEEDED.) 90 tablet 3   irbesartan (AVAPRO) 300 MG tablet Take 300 mg by mouth every morning.      MAGNESIUM MALATE PO Take 1,350 mg by mouth in the morning.     melatonin 1 MG TABS tablet Take 13 mg by mouth at bedtime as needed (as needed).     Omega-3 Fatty Acids (FISH OIL PO) Take 200 mg by mouth daily.     omeprazole (PRILOSEC) 40 MG capsule Take 40 mg by mouth every morning.      tamsulosin (FLOMAX) 0.4 MG CAPS capsule Take 0.4 mg by mouth daily.     TESTOSTERONE IM Inject 0.3 mLs into the muscle See admin instructions.     TRINTELLIX 10 MG TABS tablet Take 10 mg by mouth daily.     valACYclovir (VALTREX) 500 MG tablet Take 500 mg by mouth as needed (for flares).     No current facility-administered medications for this visit.    PHYSICAL EXAMINATION:  ECOG PERFORMANCE STATUS: 0 - Asymptomatic   There were no vitals filed for this visit.   There were no vitals filed for this visit.    Physical Exam  As part of a telephone visit a physical exam was not conducted  LABORATORY DATA: I have personally reviewed the data as  listed:  Admission on 08/01/2022,  Discharged on 08/02/2022  Component Date Value Ref Range Status   Creatinine, Ser 08/01/2022 1.04  0.61 - 1.24 mg/dL Final   GFR, Estimated 08/01/2022 >60  >60 mL/min Final   Comment: (NOTE) Calculated using the CKD-EPI Creatinine Equation (2021) Performed at [facility], [address] [place], [place] [address]    WBC 08/01/2022 6.1  4.0 - 10.5 K/uL Final   RBC 08/01/2022 4.84  4.22 - 5.81 MIL/uL Final   Hemoglobin 08/01/2022 15.3  13.0 - 17.0 g/dL Final   HCT 60/45/4098 45.0  39.0 - 52.0 % Final   MCV 08/01/2022 93.0  80.0 - 100.0 fL Final   MCH 08/01/2022 31.6  26.0 - 34.0 pg Final   MCHC 08/01/2022 34.0  30.0 - 36.0 g/dL Final   RDW 11/91/4782 13.2  11.5 - 15.5 % Final   Platelets 08/01/2022 202  150 - 400 K/uL Final   nRBC 08/01/2022 0.0  0.0 - 0.2 % Final   Performed at [facility], [address] [place], [place] [address]   Hemoglobin 08/02/2022 13.7  13.0 - 17.0 g/dL Final   HCT 30/86/5784 39.0  39.0 - 52.0 % Final   Performed at [facility], [address] [place], [place] [address]   Sodium 08/02/2022 138  135 - 145 mmol/L Final   Potassium 08/02/2022 3.8  3.5 - 5.1 mmol/L Final   Chloride 08/02/2022 104  98 - 111 mmol/L Final   CO2 08/02/2022 25  22 - 32 mmol/L Final   Glucose, Bld 08/02/2022 221 (H)  70 - 99 mg/dL Final   Glucose reference range applies only to samples taken after fasting for at least 8 hours.   BUN 08/02/2022 17  8 - 23 mg/dL Final   Creatinine, Ser 08/02/2022 1.11  0.61 - 1.24 mg/dL Final   Calcium 52/84/1324 8.6 (L)  8.9 - 10.3 mg/dL Final   GFR, Estimated 08/02/2022 >60  >60 mL/min Final   Comment: (NOTE) Calculated using the CKD-EPI Creatinine Equation (2021)    Anion gap 08/02/2022 9  5 - 15 Final   Performed at [facility], [address] [place], [place] [address]  Hospital Outpatient Visit on 07/21/2022  Component Date  Value Ref Range Status   MRSA, PCR 07/21/2022 NEGATIVE  NEGATIVE Final   Staphylococcus aureus 07/21/2022 NEGATIVE  NEGATIVE Final   Comment: (NOTE) The Xpert SA Assay (FDA approved for NASAL specimens in patients 69 years of age and older), is one component of a comprehensive surveillance program. It is not intended to diagnose infection nor to guide or monitor treatment. Performed at [facility], [address], [place], [place] [address]    Sodium 07/21/2022 139  135 - 145 mmol/L Final   Potassium 07/21/2022 3.8  3.5 - 5.1 mmol/L Final   Chloride 07/21/2022 104  98 - 111 mmol/L Final   CO2 07/21/2022 25  22 - 32 mmol/L Final   Glucose, Bld 07/21/2022 100 (H)  70 - 99 mg/dL Final   Glucose reference range applies only to samples taken after fasting for at least 8 hours.   BUN 07/21/2022 14  8 - 23 mg/dL Final   Creatinine, Ser 07/21/2022 0.99  0.61 - 1.24 mg/dL Final   Calcium 64/40/3474 9.0  8.9 - 10.3 mg/dL Final   GFR, Estimated 07/21/2022 >60  >60 mL/min Final   Comment: (NOTE) Calculated using the CKD-EPI Creatinine Equation (2021)    Anion gap 07/21/2022 10  5 - 15 Final   Performed at [facility], [address], [place], [place] [address]   WBC 07/21/2022 4.0  4.0 - 10.5 K/uL Final   RBC 07/21/2022 4.87  4.22 - 5.81 MIL/uL Final   Hemoglobin 07/21/2022 15.2  13.0 - 17.0 g/dL Final   HCT 19/14/7829 44.5  39.0 - 52.0 % Final   MCV 07/21/2022 91.4  80.0 - 100.0 fL Final   MCH 07/21/2022 31.2  26.0 - 34.0 pg Final   MCHC 07/21/2022 34.2  30.0 - 36.0 g/dL Final   RDW 56/21/3086 13.1  11.5 - 15.5 % Final   Platelets 07/21/2022 191  150 - 400 K/uL Final   nRBC 07/21/2022 0.0  0.0 - 0.2 % Final   Performed at [facility], [address] [place], [place] [address]    RADIOGRAPHIC STUDIES: I have personally reviewed the radiological images as listed and agree with the findings in the report  No results  found.  ASSESSMENT/PLAN 72 y.o. male is here because of VTE.   Medical history notable for depression, DJD, ED, GERD, hemorrhoids, colon polyps (adenomatous and hyperplastic) hyperlipidemia, hypertension  Pulmonary embolism  December 05 2022- Presented with a fib, SOB.  CT PA showed saddle embolism with bilateral pulmonary emboli.  No right heart strain  RLE U/S showed occlusive proximal DVT.  Treated with IV heparin followed by Eliquis  VTE risk factors  June 06 2022- Doubtful that the elbow surgery in early September 2023 constituted a risk factor  The most obvious risk factor is testosterone replacement therapy  Hypercoagulable state evaluation demonstrated patient is heterozygous for the FV Leiden gene  June 24 2022- History concerning for possible OSA.  To undergo sleep study  Therapeutics:  June 06 2022: Continue full dose anticoagulation at this time with consideration given to downward titration of testosterone to lowest effective dose June 24 2022- Continue full dose anticoagulation.  Recommend that testosterone dosing be decreased to that which is minimally effective and does not cause polycythemia.  If found to have OSA that should be treated.   Aug 08 2022- Remains on Eliquis 5 mb bid.  Testosterone dose had been diminished.  D dimer 2.03 but this may be the product of recent surgery.      Cancer Staging  No matching staging information was found for the patient.   No problem-specific Assessment & Plan notes found for this encounter.    No orders of the defined types were placed in this encounter.   30  minutes was spent in patient care.  This included time spent preparing to see the patient (e.g., review of tests), obtaining and/or reviewing separately obtained history, counseling and educating the patient/family/caregiver, ordering medications, tests, or procedures; documenting clinical information in the electronic or other health record, independently  interpreting results and communicating results to the patient/family/caregiver as well as coordination of care.       All questions were answered. The patient knows to call the clinic with any problems, questions or concerns.  This note was electronically signed.    Loni Muse, MD  08/07/2022 1:14 PM

## 2022-08-08 ENCOUNTER — Inpatient Hospital Stay: Payer: Medicare Other

## 2022-08-08 ENCOUNTER — Inpatient Hospital Stay (HOSPITAL_BASED_OUTPATIENT_CLINIC_OR_DEPARTMENT_OTHER): Payer: Medicare Other | Admitting: Oncology

## 2022-08-08 ENCOUNTER — Inpatient Hospital Stay: Payer: Medicare Other | Attending: Oncology

## 2022-08-08 VITALS — BP 106/73 | HR 93 | Temp 98.9°F | Resp 17 | Ht 67.0 in | Wt 178.9 lb

## 2022-08-08 DIAGNOSIS — N529 Male erectile dysfunction, unspecified: Secondary | ICD-10-CM

## 2022-08-08 DIAGNOSIS — Z96612 Presence of left artificial shoulder joint: Secondary | ICD-10-CM | POA: Diagnosis not present

## 2022-08-08 DIAGNOSIS — I82411 Acute embolism and thrombosis of right femoral vein: Secondary | ICD-10-CM | POA: Diagnosis not present

## 2022-08-08 DIAGNOSIS — E291 Testicular hypofunction: Secondary | ICD-10-CM | POA: Diagnosis not present

## 2022-08-08 DIAGNOSIS — D6859 Other primary thrombophilia: Secondary | ICD-10-CM

## 2022-08-08 DIAGNOSIS — D6851 Activated protein C resistance: Secondary | ICD-10-CM

## 2022-08-08 DIAGNOSIS — I2692 Saddle embolus of pulmonary artery without acute cor pulmonale: Secondary | ICD-10-CM

## 2022-08-08 DIAGNOSIS — F1090 Alcohol use, unspecified, uncomplicated: Secondary | ICD-10-CM | POA: Diagnosis not present

## 2022-08-08 DIAGNOSIS — Z809 Family history of malignant neoplasm, unspecified: Secondary | ICD-10-CM | POA: Diagnosis not present

## 2022-08-08 DIAGNOSIS — Z7901 Long term (current) use of anticoagulants: Secondary | ICD-10-CM | POA: Insufficient documentation

## 2022-08-08 DIAGNOSIS — Z808 Family history of malignant neoplasm of other organs or systems: Secondary | ICD-10-CM | POA: Diagnosis not present

## 2022-08-08 DIAGNOSIS — I82401 Acute embolism and thrombosis of unspecified deep veins of right lower extremity: Secondary | ICD-10-CM

## 2022-08-08 DIAGNOSIS — R7989 Other specified abnormal findings of blood chemistry: Secondary | ICD-10-CM

## 2022-08-08 DIAGNOSIS — I4891 Unspecified atrial fibrillation: Secondary | ICD-10-CM | POA: Diagnosis not present

## 2022-08-08 LAB — CBC WITH DIFFERENTIAL (CANCER CENTER ONLY)
Abs Immature Granulocytes: 0.05 10*3/uL (ref 0.00–0.07)
Basophils Absolute: 0.1 10*3/uL (ref 0.0–0.1)
Basophils Relative: 1 %
Eosinophils Absolute: 0.2 10*3/uL (ref 0.0–0.5)
Eosinophils Relative: 2 %
HCT: 40.8 % (ref 39.0–52.0)
Hemoglobin: 14.2 g/dL (ref 13.0–17.0)
Immature Granulocytes: 1 %
Lymphocytes Relative: 20 %
Lymphs Abs: 1.5 10*3/uL (ref 0.7–4.0)
MCH: 31.8 pg (ref 26.0–34.0)
MCHC: 34.8 g/dL (ref 30.0–36.0)
MCV: 91.3 fL (ref 80.0–100.0)
Monocytes Absolute: 1 10*3/uL (ref 0.1–1.0)
Monocytes Relative: 14 %
Neutro Abs: 4.5 10*3/uL (ref 1.7–7.7)
Neutrophils Relative %: 62 %
Platelet Count: 263 10*3/uL (ref 150–400)
RBC: 4.47 MIL/uL (ref 4.22–5.81)
RDW: 12.8 % (ref 11.5–15.5)
WBC Count: 7.2 10*3/uL (ref 4.0–10.5)
nRBC: 0 % (ref 0.0–0.2)

## 2022-08-08 LAB — D-DIMER, QUANTITATIVE: D-Dimer, Quant: 2.03 ug/mL-FEU — ABNORMAL HIGH (ref 0.00–0.50)

## 2022-08-10 LAB — TESTOSTERONE: Testosterone: 662 ng/dL (ref 264–916)

## 2022-08-11 ENCOUNTER — Telehealth: Payer: Self-pay | Admitting: Oncology

## 2022-08-11 NOTE — Telephone Encounter (Signed)
Spoke with patient confirming upcoming appointment  

## 2022-08-13 ENCOUNTER — Encounter: Payer: Self-pay | Admitting: Oncology

## 2022-08-14 ENCOUNTER — Telehealth: Payer: Self-pay

## 2022-08-14 NOTE — Telephone Encounter (Signed)
Called patient to relay message below form Dr. Angelene Giovanni as per patients request. Patient voiced full understanding.   D dimer is a test used to determine if you are adequately anticoagulated   You  Ribakove, Cindie Crumbly, MD20 hours ago (2:58 PM)    Please advise paitent   Drue Stager III "Cam"  P Onc Nurse Cc (supporting Loni Muse, MD)21 hours ago (2:08 PM)    Dr Verdene Lennert, What does my D-Dimer diagnosis mean? Cam [address]

## 2022-08-17 DIAGNOSIS — R7989 Other specified abnormal findings of blood chemistry: Secondary | ICD-10-CM | POA: Insufficient documentation

## 2022-08-18 DIAGNOSIS — Z4789 Encounter for other orthopedic aftercare: Secondary | ICD-10-CM | POA: Diagnosis not present

## 2022-08-20 DIAGNOSIS — M25551 Pain in right hip: Secondary | ICD-10-CM | POA: Diagnosis not present

## 2022-08-20 DIAGNOSIS — M25552 Pain in left hip: Secondary | ICD-10-CM | POA: Diagnosis not present

## 2022-08-20 DIAGNOSIS — M159 Polyosteoarthritis, unspecified: Secondary | ICD-10-CM | POA: Diagnosis not present

## 2022-08-20 DIAGNOSIS — M2550 Pain in unspecified joint: Secondary | ICD-10-CM | POA: Diagnosis not present

## 2022-08-20 DIAGNOSIS — R29898 Other symptoms and signs involving the musculoskeletal system: Secondary | ICD-10-CM | POA: Diagnosis not present

## 2022-08-26 DIAGNOSIS — D6851 Activated protein C resistance: Secondary | ICD-10-CM | POA: Diagnosis not present

## 2022-08-26 DIAGNOSIS — Z6827 Body mass index (BMI) 27.0-27.9, adult: Secondary | ICD-10-CM | POA: Diagnosis not present

## 2022-08-26 DIAGNOSIS — M5416 Radiculopathy, lumbar region: Secondary | ICD-10-CM | POA: Diagnosis not present

## 2022-08-27 DIAGNOSIS — M5126 Other intervertebral disc displacement, lumbar region: Secondary | ICD-10-CM | POA: Diagnosis not present

## 2022-08-27 DIAGNOSIS — M5416 Radiculopathy, lumbar region: Secondary | ICD-10-CM | POA: Diagnosis not present

## 2022-09-09 DIAGNOSIS — M5136 Other intervertebral disc degeneration, lumbar region: Secondary | ICD-10-CM | POA: Diagnosis not present

## 2022-09-09 DIAGNOSIS — M5416 Radiculopathy, lumbar region: Secondary | ICD-10-CM | POA: Diagnosis not present

## 2022-09-09 DIAGNOSIS — Z6828 Body mass index (BMI) 28.0-28.9, adult: Secondary | ICD-10-CM | POA: Diagnosis not present

## 2022-09-10 DIAGNOSIS — Z85828 Personal history of other malignant neoplasm of skin: Secondary | ICD-10-CM | POA: Diagnosis not present

## 2022-09-10 DIAGNOSIS — D485 Neoplasm of uncertain behavior of skin: Secondary | ICD-10-CM | POA: Diagnosis not present

## 2022-09-10 DIAGNOSIS — C44729 Squamous cell carcinoma of skin of left lower limb, including hip: Secondary | ICD-10-CM | POA: Diagnosis not present

## 2022-09-10 DIAGNOSIS — L570 Actinic keratosis: Secondary | ICD-10-CM | POA: Diagnosis not present

## 2022-09-23 DIAGNOSIS — Z4789 Encounter for other orthopedic aftercare: Secondary | ICD-10-CM | POA: Diagnosis not present

## 2022-09-25 DIAGNOSIS — Z6828 Body mass index (BMI) 28.0-28.9, adult: Secondary | ICD-10-CM | POA: Diagnosis not present

## 2022-09-25 DIAGNOSIS — R700 Elevated erythrocyte sedimentation rate: Secondary | ICD-10-CM | POA: Diagnosis not present

## 2022-09-25 DIAGNOSIS — E663 Overweight: Secondary | ICD-10-CM | POA: Diagnosis not present

## 2022-09-25 DIAGNOSIS — M2559 Pain in other specified joint: Secondary | ICD-10-CM | POA: Diagnosis not present

## 2022-09-29 DIAGNOSIS — M5416 Radiculopathy, lumbar region: Secondary | ICD-10-CM | POA: Diagnosis not present

## 2022-09-30 ENCOUNTER — Telehealth: Payer: Self-pay | Admitting: *Deleted

## 2022-09-30 DIAGNOSIS — M5136 Other intervertebral disc degeneration, lumbar region: Secondary | ICD-10-CM | POA: Diagnosis not present

## 2022-09-30 NOTE — Telephone Encounter (Signed)
   Name: Jacob [name]  DOB: 1950-08-01  MRN: [id number]  Primary Cardiologist: Little Ishikawa, MD   Preoperative team, please contact this patient and set up a phone call appointment for further preoperative risk assessment. Please obtain consent and complete medication review. Thank you for your help.  I confirm that guidance regarding antiplatelet and oral anticoagulation therapy has been completed and, if necessary, noted below.  Pt on Eliquis for hx of DVT/PE being managed by PCP. Recommend clearance come from managing provider.     Joylene Grapes, NP 09/30/2022, 1:25 PM [facility]

## 2022-09-30 NOTE — Telephone Encounter (Signed)
Pt on Eliquis for hx of DVT/PE being managed by PCP. Recommend clearance come from managing provider.

## 2022-09-30 NOTE — Telephone Encounter (Signed)
S/w pt is set up for telephone pre op.  Med rec and consent done.  Will remove from pre op pool.     Patient Consent for Virtual Visit         Jacob [name] has provided verbal consent on 09/30/2022 for a virtual visit (video or telephone).   CONSENT FOR VIRTUAL VISIT FOR:  Jacob [name]  By participating in this virtual visit I agree to the following:  I hereby voluntarily request, consent and authorize [facility] and its employed or contracted physicians, physician assistants, nurse practitioners or other licensed health care professionals (the Practitioner), to provide me with telemedicine health care services (the "Services") as deemed necessary by the treating Practitioner. I acknowledge and consent to receive the Services by the Practitioner via telemedicine. I understand that the telemedicine visit will involve communicating with the Practitioner through live audiovisual communication technology and the disclosure of certain medical information by electronic transmission. I acknowledge that I have been given the opportunity to request an in-person assessment or other available alternative prior to the telemedicine visit and am voluntarily participating in the telemedicine visit.  I understand that I have the right to withhold or withdraw my consent to the use of telemedicine in the course of my care at any time, without affecting my right to future care or treatment, and that the Practitioner or I may terminate the telemedicine visit at any time. I understand that I have the right to inspect all information obtained and/or recorded in the course of the telemedicine visit and may receive copies of available information for a reasonable fee.  I understand that some of the potential risks of receiving the Services via telemedicine include:  Delay or interruption in medical evaluation due to technological equipment failure or disruption; Information transmitted may not be  sufficient (e.g. poor resolution of images) to allow for appropriate medical decision making by the Practitioner; and/or  In rare instances, security protocols could fail, causing a breach of personal health information.  Furthermore, I acknowledge that it is my responsibility to provide information about my medical history, conditions and care that is complete and accurate to the best of my ability. I acknowledge that Practitioner's advice, recommendations, and/or decision may be based on factors not within their control, such as incomplete or inaccurate data provided by me or distortions of diagnostic images or specimens that may result from electronic transmissions. I understand that the practice of medicine is not an exact science and that Practitioner makes no warranties or guarantees regarding treatment outcomes. I acknowledge that a copy of this consent can be made available to me via my patient portal [facility] MyChart), or I can request a printed copy by calling the office of [facility].    I understand that my insurance will be billed for this visit.   I have read or had this consent read to me. I understand the contents of this consent, which adequately explains the benefits and risks of the Services being provided via telemedicine.  I have been provided ample opportunity to ask questions regarding this consent and the Services and have had my questions answered to my satisfaction. I give my informed consent for the services to be provided through the use of telemedicine in my medical care

## 2022-09-30 NOTE — Telephone Encounter (Signed)
   Pre-operative Risk Assessment    Patient Name: Jacob [name]  DOB: 03-Sep-1950 MRN: [id number]      Request for Surgical Clearance    Procedure:   L4-L5 ESI  Date of Surgery:  Clearance TBD                                 Surgeon:  Dr. Aileen Fass Surgeon's Group or Practice Name:  [facility] Phone number:  [phone] Fax number:  [phone]   Type of Clearance Requested:   - Medical  - Pharmacy:  Hold Apixaban (Eliquis) 3 days prior.   Type of Anesthesia:  Not Indicated   Additional requests/questions:    Signed, Emmit Pomfret   09/30/2022, 11:41 AM

## 2022-10-01 ENCOUNTER — Telehealth: Payer: Self-pay | Admitting: *Deleted

## 2022-10-01 DIAGNOSIS — H2513 Age-related nuclear cataract, bilateral: Secondary | ICD-10-CM | POA: Diagnosis not present

## 2022-10-01 DIAGNOSIS — H25043 Posterior subcapsular polar age-related cataract, bilateral: Secondary | ICD-10-CM | POA: Diagnosis not present

## 2022-10-01 DIAGNOSIS — H25013 Cortical age-related cataract, bilateral: Secondary | ICD-10-CM | POA: Diagnosis not present

## 2022-10-01 DIAGNOSIS — H40013 Open angle with borderline findings, low risk, bilateral: Secondary | ICD-10-CM | POA: Diagnosis not present

## 2022-10-01 DIAGNOSIS — H353221 Exudative age-related macular degeneration, left eye, with active choroidal neovascularization: Secondary | ICD-10-CM | POA: Diagnosis not present

## 2022-10-01 DIAGNOSIS — H2512 Age-related nuclear cataract, left eye: Secondary | ICD-10-CM | POA: Diagnosis not present

## 2022-10-01 DIAGNOSIS — H43392 Other vitreous opacities, left eye: Secondary | ICD-10-CM | POA: Diagnosis not present

## 2022-10-01 NOTE — Telephone Encounter (Signed)
   Patient Name: Jacob [name]  DOB: May 12, 1950 MRN: [id number]  Primary Cardiologist: Little Ishikawa, MD  Chart reviewed as part of pre-operative protocol coverage. Cataract extractions are recognized in guidelines as low risk surgeries that do not typically require specific preoperative testing or holding of blood thinner therapy. Therefore, given past medical history and time since last visit, based on ACC/AHA guidelines, Jacob [name] would be at acceptable risk for the planned procedure without further cardiovascular testing.   I will route this recommendation to the requesting party via Epic fax function and remove from pre-op pool.  Please call with questions.  Levi Aland, NP-C 10/01/2022, 11:04 AM [address], [address] Office [phone] Fax [phone]

## 2022-10-01 NOTE — Telephone Encounter (Signed)
  PT IS ALREADY SCHEDULED FOR A TELE VISIT 10/13/22 FOR ANOTHER PROCEDURE        Pre-operative Risk Assessment    Patient Name: Jacob [name]  DOB: 1950-05-09 MRN: [id number]      Request for Surgical Clearance    Procedure:   CATARACT EXTRACTION W/ INTRAOCULAR LENS IMPLANTATION OF THE LEFT EYE FOLLOWED BY THE RIGHT EYE  Date of Surgery:  Clearance 10/28/22   & 11/11/2022                            Surgeon:  DR. Delaney Meigs Surgeon's Group or Practice Name:  [facility] Phone number:  [phone] Fax number:  [phone]   Type of Clearance Requested:   - Medical    Type of Anesthesia:  Not Indicated   Additional requests/questions:    Wilhemina Cash   10/01/2022, 10:18 AM

## 2022-10-13 ENCOUNTER — Ambulatory Visit: Payer: Medicare Other | Attending: Internal Medicine | Admitting: Student

## 2022-10-13 DIAGNOSIS — Z01810 Encounter for preprocedural cardiovascular examination: Secondary | ICD-10-CM

## 2022-10-13 NOTE — Progress Notes (Signed)
Virtual Visit via Telephone Note   Because of Jacob [name]'s co-morbid illnesses, he is at least at moderate risk for complications without adequate follow up.  This format is felt to be most appropriate for this patient at this time.  The patient did not have access to video technology/had technical difficulties with video requiring transitioning to audio format only (telephone).  All issues noted in this document were discussed and addressed.  No physical exam could be performed with this format.  Please refer to the patient's chart for his consent to telehealth for [facility].  Evaluation Performed:  Preoperative cardiovascular risk assessment _____________   Date:  10/13/2022   Patient ID:  Jacob [name] Aug 03, 1950, MRN [id number] Patient Location:  Home Provider location:   Office  Primary Care Provider:  Cleatis Polka., MD Primary Cardiologist:  Little Ishikawa, MD  Chief Complaint / Patient Profile   72 y.o. y/o male with a h/o palpitations/PACs, hypertension, hyperlipidemia, factor V Leiden mutation, DVT/PE on anticoagulation managed by PCP who is pending L4-L5 ESI by Dr. Lorrine Kin and presents today for telephonic preoperative cardiovascular risk assessment.  History of Present Illness    Jacob [name] is a 72 y.o. male who presents via audio/video conferencing for a telehealth visit today.  Pt was last seen in cardiology clinic on 04/29/2022 by Dr. Ladona Ridgel.  At that time Jacob [name] was stable from a cardiac standpoint.  The patient is now pending procedure as outlined above. Since his last visit, he is doing well. Patient denies shortness of breath or dyspnea on exertion. No chest pain, pressure, or tightness. Denies lower extremity edema, orthopnea, or PND. No palpitations.  He stays active walking for exercise daily.   Past Medical History    Past Medical History:  Diagnosis Date   Depression    DJD (degenerative joint disease)    DVT  (deep venous thrombosis) (HCC) 12/04/2021   PE/ Facort V Leiden   ED (erectile dysfunction) of organic origin    Factor V Leiden (HCC)    GERD (gastroesophageal reflux disease)    Hemorrhoids    History of adenomatous polyp of colon    12-11-2014  tubular adenoam, hyperplastic   Hyperlipidemia    Hypertension    Macular edema    Receiving injections   Pulmonary embolism (HCC)    Skin cancer    Past Surgical History:  Procedure Laterality Date   ACHILLES TENDON REPAIR Right 11/2009   ruptured   ANTERIOR CERVICAL DECOMP/DISCECTOMY FUSION  04/23/2009   C7 -- T1   CARPAL TUNNEL RELEASE Bilateral    2020 (4 months apart)   COLONOSCOPY WITH PROPOFOL N/A 12/11/2014   Procedure: COLONOSCOPY WITH PROPOFOL;  Surgeon: Charolett Bumpers, MD;  Location: [facility];  Service: Endoscopy;  Laterality: N/A;   FINGER ARTHRODESIS Right 02/06/2017   Procedure: ARTHRODESIS RIGHT INDEX FINGER DISTAL INTERPHALANGEAL JOINT;  Surgeon: Betha Loa, MD;  Location: [facility];  Service: Orthopedics;  Laterality: Right;   INGUINAL HERNIA REPAIR Bilateral left 03/18/2002/  right -- yrs ago   PENILE PROSTHESIS IMPLANT N/A 04/03/2016   Procedure: PENILE PROTHESIS INFLATABLE;  Surgeon: Marcine Matar, MD;  Location: [facility];  Service: Urology;  Laterality: N/A;   REVERSE SHOULDER ARTHROPLASTY Left 08/01/2022   Procedure: REVERSE SHOULDER ARTHROPLASTY;  Surgeon: Yolonda Kida, MD;  Location: [facility];  Service: Orthopedics;  Laterality: Left;  115   SHOULDER ARTHROSCOPY  W/ ROTATOR CUFF REPAIR Right 2002 and 2012   SHOULDER ARTHROSCOPY WITH ROTATOR CUFF REPAIR AND SUBACROMIAL DECOMPRESSION Right 05/06/2012   Procedure: SHOULDER ARTHROSCOPY WITH ROTATOR CUFF REPAIR AND SUBACROMIAL DECOMPRESSION;  Surgeon: Loreta Ave, MD;  Location: [facility];  Service: Orthopedics;  Laterality: Right;  RIGHT SHOULDER ARTHROSCOPY  lysis of adhesions rotator cuff repair     SHOULDER ARTHROSCOPY/  DEBRIDEMENT LABRUM AND ROTATOR CUFF PARTIAL TEAR/  RESECTION BURSA/  ACROMIOPLASTY/  CORACOACROMINAL LIGAMENT RELEASE/  DCR Left 12/17/2001   SOFT TISSUE MASS EXCISION  06/25/2006   right forearm-- angiolipoma   TONSILLECTOMY AND ADENOIDECTOMY  child   TOTAL SHOULDER ARTHROPLASTY Right 02/04/2016   ULNAR TUNNEL RELEASE Left    11/2021    Allergies  Allergies  Allergen Reactions   Vancomycin Other (See Comments)    Hypotension    Hydrocodone Other (See Comments)    "jumps out of skin"   Penicillins Other (See Comments)    Unknown childhood reaction   Percocet [Oxycodone-Acetaminophen] Other (See Comments)    "jumps out of skin"    Home Medications    Prior to Admission medications   Medication Sig Start Date End Date Taking? Authorizing Provider  amLODipine (NORVASC) 10 MG tablet Take 5 mg by mouth daily in the afternoon. 11/04/21   [provider]  anastrozole (ARIMIDEX) 1 MG tablet Take 1 mg by mouth daily. 11/01/21   [provider]  apixaban (ELIQUIS) 2.5 MG TABS tablet Take 1 tablet (2.5 mg total) by mouth 2 (two) times daily for 14 days. 08/01/22 08/15/22  Yolonda Kida, MD  apixaban (ELIQUIS) 5 MG TABS tablet Take 1 tablet (5 mg total) by mouth 2 (two) times daily. 05/13/22   Marinus Maw, MD  atorvastatin (LIPITOR) 20 MG tablet Take 20 mg by mouth every morning.     [provider]  baclofen (LIORESAL) 10 MG tablet Take 1 tablet by mouth 3 (three) times daily as needed.    [provider]  buPROPion (WELLBUTRIN XL) 300 MG 24 hr tablet Take 300 mg by mouth every morning.     [provider]  furosemide (LASIX) 20 MG tablet Take 1 tablet (20 mg total) by mouth daily as needed. Only Take AS NEEDED. Patient taking differently: Take 20 mg by mouth daily as needed for fluid. Only Take AS NEEDED. 01/23/22   Marinus Maw, MD  irbesartan (AVAPRO) 300 MG tablet Take 300 mg by mouth every morning.      [provider]  MAGNESIUM MALATE PO Take 1,350 mg by mouth in the morning.    [provider]  melatonin 1 MG TABS tablet Take 13 mg by mouth at bedtime as needed (as needed).    [provider]  Omega-3 Fatty Acids (FISH OIL PO) Take 200 mg by mouth daily.    [provider]  omeprazole (PRILOSEC) 40 MG capsule Take 40 mg by mouth every morning.     [provider]  tamsulosin (FLOMAX) 0.4 MG CAPS capsule Take 0.4 mg by mouth daily. 01/21/21   [provider]  TESTOSTERONE IM Inject 0.4 mLs into the muscle See admin instructions. Every 10 days    [provider]  traMADol (ULTRAM) 50 MG tablet Take 1 tablet by mouth 2 (two) times daily as needed for moderate pain (back pain). 09/29/22   [provider]  TRINTELLIX 10 MG TABS tablet Take 10 mg by mouth daily. 10/26/20   [provider]  valACYclovir (VALTREX) 500 MG tablet Take 500 mg by mouth as needed (for flares).    [provider]    Physical Exam    Vital Signs:  Jacob [name] does not have vital signs available for review today.  Given telephonic nature of communication, physical exam is limited. AAOx3. NAD. Normal affect.  Speech and respirations are unlabored.  Accessory Clinical Findings    None  Assessment & Plan    Primary Cardiologist: Little Ishikawa, MD  Preoperative cardiovascular risk assessment.  L4-L5 ESI by Dr. Lorrine Kin.  Chart reviewed as part of pre-operative protocol coverage. According to the RCRI, patient has a 0.4% risk of MACE. Patient reports activity equivalent to >4.0 METS (walking daily).   Given past medical history and time since last visit, based on ACC/AHA guidelines, Jacob [name] would be at acceptable risk for the planned procedure without further cardiovascular testing.   Patient was advised that if he develops new symptoms prior to surgery to contact our office to arrange a follow-up appointment.   he verbalized understanding.  Eliquis prescribed by a noncardiology provider therefore recommendations for holding deferred to prescribing provider (PCP).    I will route this recommendation to the requesting party via Epic fax function.  Please call with questions.  Time:   Today, I have spent 5 minutes with the patient with telehealth technology discussing medical history, symptoms, and management plan.     Jacob Levering, NP  10/13/2022, 8:37 AM

## 2022-10-14 ENCOUNTER — Ambulatory Visit (INDEPENDENT_AMBULATORY_CARE_PROVIDER_SITE_OTHER): Payer: Medicare Other | Admitting: Neurology

## 2022-10-14 DIAGNOSIS — R002 Palpitations: Secondary | ICD-10-CM

## 2022-10-14 DIAGNOSIS — G4719 Other hypersomnia: Secondary | ICD-10-CM

## 2022-10-14 DIAGNOSIS — I491 Atrial premature depolarization: Secondary | ICD-10-CM

## 2022-10-14 DIAGNOSIS — Z9189 Other specified personal risk factors, not elsewhere classified: Secondary | ICD-10-CM

## 2022-10-14 DIAGNOSIS — Z86711 Personal history of pulmonary embolism: Secondary | ICD-10-CM

## 2022-10-14 DIAGNOSIS — R9431 Abnormal electrocardiogram [ECG] [EKG]: Secondary | ICD-10-CM

## 2022-10-14 DIAGNOSIS — G4733 Obstructive sleep apnea (adult) (pediatric): Secondary | ICD-10-CM

## 2022-10-14 DIAGNOSIS — E663 Overweight: Secondary | ICD-10-CM

## 2022-10-14 DIAGNOSIS — R0683 Snoring: Secondary | ICD-10-CM

## 2022-10-14 DIAGNOSIS — H353221 Exudative age-related macular degeneration, left eye, with active choroidal neovascularization: Secondary | ICD-10-CM | POA: Diagnosis not present

## 2022-10-14 DIAGNOSIS — G4720 Circadian rhythm sleep disorder, unspecified type: Secondary | ICD-10-CM

## 2022-10-15 DIAGNOSIS — M5136 Other intervertebral disc degeneration, lumbar region: Secondary | ICD-10-CM | POA: Diagnosis not present

## 2022-10-21 DIAGNOSIS — M25512 Pain in left shoulder: Secondary | ICD-10-CM | POA: Diagnosis not present

## 2022-10-22 NOTE — Procedures (Signed)
Physician Interpretation:     [facility] POLYSOMNOGRAPHY  INTERPRETATION REPORT   STUDY DATE:  10/14/2022     PATIENT NAME:  Jacob [name], IIl         DATE OF BIRTH:  [birth year]  PATIENT ID:  [id number]    TYPE OF STUDY:  PSG  READING PHYSICIAN: Huston Foley, MD, PhD SCORING TECHNICIAN: Margaretann Loveless, RPSGT    Referred by: Cleatis Polka., MD  ? History and Indication for Testing: 72 year old male with an underlying complex medical history of hypertension, hyperlipidemia, reflux disease, depression, anxiety, history of transient global amnesia in 2022, suspected A-fib in August 2023, history of DVT and PE in September 2023, factor V Leiden mutation with hypercoagulable state, on Eliquis, bilateral hip bursitis, arthritis with status post reverse right shoulder replacement in 2017, pending shoulder replacement on the left, history of cervical spine fusion, bilateral rotator cuff repairs, left ulnar nerve release, degenerative joint disease, ED, with status post penile implant, macular edema with status post injections, hypogonadism on testosterone replacement, overactive bladder, history of atrial bigeminy, and overweight state, who reports snoring and excessive daytime somnolence. His Epworth sleepiness score is 3 out of 24, fatigue severity score is 13 out of 63. The patient wore his dental device for the study. He took Tylenol PM, tramadol and melatonin prior to study start. Height: 67 in Weight: 179 lb (BMI 28) Neck Size: 17 in.   MEDICATIONS: Norvasc, Arimidex, Eliquis, Lipitor, Lioresal, Wellbutrin XL, Epinephrine, Lasix, Avapro, Magnesium Malate, Fish Oil, Prilosec, Flomax, Testosterone, Trintellix, Valtrex   TECHNICAL DESCRIPTION: A registered sleep technologist was in attendance for the duration of the recording.  Data collection, scoring, video monitoring, and reporting were performed in compliance with the AASM Manual for the Scoring of Sleep and  Associated Events; (Hypopnea is scored based on the criteria listed in Section VIII D. 1b in the AASM Manual V2.6 using a 4% oxygen desaturation rule or Hypopnea is scored based on the criteria listed in Section VIII D. 1a in the AASM Manual V2.6 using 3% oxygen desaturation and /or arousal rule).   SLEEP CONTINUITY AND SLEEP ARCHITECTURE:  Lights-out was at 21:54: and lights-on at  04:56:, with a total recording time of 7 hours, 3 min. Total sleep time ( TST) was 345.0 minutes with a decreased sleep efficiency at 81.6%. There was  26.1% REM sleep.   BODY POSITION:  TST was divided  between the following sleep positions: 68.0% supine;  8.4% lateral;  24% prone. Duration of total sleep and percent of total sleep in their respective position is as follows: supine 234 minutes (68%), non-supine 111 minutes (32%); right 29 minutes (8%), left 00 minutes (0%), and prone 81 minutes (24%).  Total supine REM sleep time was 65 minutes (73% of total REM sleep). Sleep latency was increased at 39.5 minutes.  REM sleep latency was increased at 189.0 minutes. Of the total sleep time, the percentage of stage N1 sleep was 5.9%, stage N2 sleep was 52%, which is normal, stage N3 sleep was 16.2%, and REM sleep was 26.1%, which is mildly increased. Wake after sleep onset (WASO) time accounted for 38.5 minutes with minimal to mild sleep fragmentation noted.   RESPIRATORY MONITORING:  Based on CMS criteria (using a 4% oxygen desaturation rule for scoring hypopneas), there were 7 apneas (7 obstructive; 0 central; 0 mixed), and 93 hypopneas.  Apnea index was 1.2. Hypopnea index was 16.2. The apnea-hypopnea index was 17.4 overall (21.5 supine, 39  non-supine; 56.7 REM, 63.2 supine REM).  There were 0 respiratory effort-related arousals (RERAs).  The RERA index was 0 events/h. Total respiratory disturbance index (RDI) was 17.4 events/h. RDI results showed: supine RDI  21.5 /h; non-supine RDI 8.7 /h; REM RDI 56.7 /h, supine REM RDI  63.2 /h.   Based on AASM criteria (using a 3% oxygen desaturation and /or arousal rule for scoring hypopneas), there were 7 apneas (7 obstructive; 0 central; 0 mixed), and 95 hypopneas. Apnea index was 1.2. Hypopnea index was 16.5. The apnea-hypopnea index was 17.7 overall (22.0 supine, 39 non-supine; 57.3 REM, 64.1 supine REM). There were 0 respiratory effort-related arousals (RERAs).  The RERA index was 0 events/h. Total respiratory disturbance index (RDI) was 17.7 events/h. RDI results showed: supine RDI  22.0 /h; non-supine RDI 8.7 /h; REM RDI 57.3 /h, supine REM RDI 64.1 /h.   OXIMETRY: Oxyhemoglobin Saturation Nadir during sleep was at  69% from a mean of 92%.  Of the Total sleep time (TST)   hypoxemia (=<88%) was present for  24.6 minutes, or 7.1% of total sleep time.   LIMB MOVEMENTS: There were 0 periodic limb movements of sleep (0.0/hr), of which 0 (0.0/hr) were associated with an arousal.  AROUSAL: There were 88 arousals in total, for an arousal index of 15 arousals/hour.  Of these, 44 were identified as respiratory-related arousals (8 /h), 0 were PLM-related arousals (0 /h), and 63 were non-specific arousals (11 /h).   EEG: Review of the EEG showed no abnormal electrical discharges and symmetrical bihemispheric findings.    EKG: The EKG revealed normal sinus rhythm (NSR), with rare PVCs noted. The average heart rate during sleep was 71 bpm.   AUDIO/VIDEO REVIEW: The audio and video review did not show any abnormal or unusual behaviors, movements, phonations or vocalizations. The patient took one restroom break. Snoring was noted, mostly in the mild range.  POST-STUDY QUESTIONNAIRE: Post study, the patient indicated, that sleep was worse than usual.    IMPRESSION:   1. Moderate obstructive Sleep Apnea (OSA) 2. Non specific abnormal EKG  RECOMMENDATIONS:  1. This study demonstrates moderate obstructive sleep apnea, with a total AHI of 17.4/hour, REM AHI of 56.7/hour, supine AHI of  21.5/hour and O2 nadir of 69% (during supine REM sleep). Of note, the patient wore his dental device for the sleep study, which may have led to an underestimation of his baseline sleep disordered breathing. Treatment with a positive airway pressure (PAP) device is recommended.  The patient will be advised to proceed with home autoPAP therapy for now.  A laboratory attended, full night PAP-titration study can be considered down the road, to optimize therapy settings, mask fit, monitoring of tolerance and of proper oxygen saturations. Other treatment options may be limited, and may include (generally speaking) surgical options in selected patients. Concomitant weight loss is highly recommended (where clinically feasible). Please note that untreated obstructive sleep apnea may carry additional perioperative morbidity. Patients with significant obstructive sleep apnea should receive perioperative PAP therapy and the surgeons and particularly the anesthesiologist should be informed of the diagnosis and the severity of the sleep disordered breathing. 3. The patient should be cautioned not to drive, work at heights, or operate dangerous or heavy equipment when tired or sleepy. Review and reiteration of good sleep hygiene measures should be pursued with any patient. 4. The study showed rare PVCs on single lead EKG; clinical correlation is recommended. The patient is well-known to cardiology.  5. The patient will be seen in  follow-up in the sleep clinic at [facility] for discussion of the test results, symptom and treatment compliance review, further management strategies, etc. The patient and the referring provider will be notified of the test results.   I certify that I have reviewed the entire raw data recording prior to the issuance of this report in accordance with the Standards of Accreditation of the American Academy of Sleep Medicine (AASM).  Huston Foley, MD, PhD Medical Director, [facility]) Diplomat, ABPN (Neurology and Sleep)             Technical Report:   General Information  Name: Greyson, Sobotta BMI: 13.08 Physician: Huston Foley, MD  ID: [id number] Height: 67.0 in Technician: Margaretann Loveless, RPSGT  Sex: Male Weight: 179.0 lb Record: [id number]  Age: 81 [Aug 27, 1950] Date: 10/14/2022    Medical & Medication History    72 year old male with an underlying complex medical history of hypertension, hyperlipidemia, reflux disease, depression, anxiety, history of transient global amnesia in 2022, suspectedA-fib in August 2023, history of DVT and PE in September 2023, factor V Leiden mutation with hypercoagulable state, on Eliquis, bilateral hip bursitis, arthritis with status post reverse right shoulder replacement in 2017, pending shoulder replacement on the left, history of cervical spine fusion, bilateral rotator cuff repairs, left ulnar nerve release, degenerative joint disease, ED, with status post penile implant, macular edema with status post injections, hypogonadism on testosterone replacement, overactive bladder, history of atrial bigeminy, and overweight state, who reports snoring and excessive daytime somnolence. Of note, he has been using an oral appliance through his dentist for the past 1 year to alleviate snoring. It is a custom fitted device. He uses it nightly with very minimal exceptions. He reports having done a home sleep test with his dentist about 2 years ago, as he recalls it was negative for sleep apnea but showed snoring.  Norvasc, Arimidex, Eliquis, Lipitor, Lioresal, Wellbutrin XL, Epinephrine, Lasix, Avapro, Magnesium Malate, Fish Oil, Prilosec, Flomax, Testosterone, Trintellix, Valtrex   Sleep Disorder      Comments   The patient came into the lab for a PSG. The patient took Tylenol PM, Melatonin and Tramadol prior to start of study. The patient had one restroom break. The patient wore dental device for the study. EKG kept in  NSR with occasional PVC's. Mild snoring. All sleep stages witnessed. Respiratory events scored with a 4% desat. Majority of respiratory events were during REM no matter the position. Slept lateral and supine.     Lights out: 09:54:14 PM Lights on: 04:56:57 AM   Time Total Supine Side Prone Upright  Recording (TRT) 7h 3.53m 4h 12.26m 0h 59.23m 1h 52.37m 0h 0.54m  Sleep (TST) 5h 45.42m 3h 54.1m 0h 29.7m 1h 21.60m 0h 0.58m   Latency N1 N2 N3 REM Onset Per. Slp. Eff.  Actual 0h 0.62m 0h 2.18m 1h 21.22m 3h 9.36m 0h 39.10m 0h 50.22m 81.56%   Stg Dur Wake N1 N2 N3 REM  Total 78.0 20.5 178.5 56.0 90.0  Supine 17.5 8.5 109.5 51.0 65.5  Side 30.0 2.0 2.5 0.0 24.5  Prone 30.5 10.0 66.5 5.0 0.0  Upright 0.0 0.0 0.0 0.0 0.0   Stg % Wake N1 N2 N3 REM  Total 18.4 5.9 51.7 16.2 26.1  Supine 4.1 2.5 31.7 14.8 19.0  Side 7.1 0.6 0.7 0.0 7.1  Prone 7.2 2.9 19.3 1.4 0.0  Upright 0.0 0.0 0.0 0.0 0.0     Apnea Summary Sub Supine Side Prone Upright  Total 7 Total 7 7 0 0 0    REM 7 7 0 0 0    NREM 0 0 0 0 0  Obs 7 REM 7 7 0 0 0    NREM 0 0 0 0 0  Mix 0 REM 0 0 0 0 0    NREM 0 0 0 0 0  Cen 0 REM 0 0 0 0 0    NREM 0 0 0 0 0   Rera Summary Sub Supine Side Prone Upright  Total 0 Total 0 0 0 0 0    REM 0 0 0 0 0    NREM 0 0 0 0 0   Hypopnea Summary Sub Supine Side Prone Upright  Total 95 Total 95 79 16 0 0    REM 79 63 16 0 0    NREM 16 16 0 0 0   4% Hypopnea Summary Sub Supine Side Prone Upright  Total (4%) 93 Total 93 77 16 0 0    REM 78 62 16 0 0    NREM 15 15 0 0 0     AHI Total Obs Mix Cen  17.74 Apnea 1.22 1.22 0.00 0.00   Hypopnea 16.52 -- -- --  17.39 Hypopnea (4%) 16.17 -- -- --    Total Supine Side Prone Upright  Position AHI 17.74 22.00 33.10 0.00 0.00  REM AHI 57.33   NREM AHI 3.76   Position RDI 17.74 22.00 33.10 0.00 0.00  REM RDI 57.33   NREM RDI 3.76    4% Hypopnea Total Supine Side Prone Upright  Position AHI (4%) 17.39 21.49 33.10 0.00 0.00  REM AHI (4%) 56.67   NREM AHI  (4%) 3.53   Position RDI (4%) 17.39 21.49 33.10 0.00 0.00  REM RDI (4%) 56.67   NREM RDI (4%) 3.53    Desaturation Information Threshold: 2% <100% <90% <80% <70% <60% <50% <40%  Supine 169.0 57.0 11.0 1.0 0.0 0.0 0.0  Side 22.0 13.0 0.0 0.0 0.0 0.0 0.0  Prone 46.0 1.0 0.0 0.0 0.0 0.0 0.0  Upright 0.0 0.0 0.0 0.0 0.0 0.0 0.0  Total 237.0 71.0 11.0 1.0 0.0 0.0 0.0  Index 37.1 11.1 1.7 0.2 0.0 0.0 0.0   Threshold: 3% <100% <90% <80% <70% <60% <50% <40%  Supine 108.0 56.0 11.0 1.0 0.0 0.0 0.0  Side 21.0 13.0 0.0 0.0 0.0 0.0 0.0  Prone 6.0 1.0 0.0 0.0 0.0 0.0 0.0  Upright 0.0 0.0 0.0 0.0 0.0 0.0 0.0  Total 135.0 70.0 11.0 1.0 0.0 0.0 0.0  Index 21.1 11.0 1.7 0.2 0.0 0.0 0.0   Threshold: 4% <100% <90% <80% <70% <60% <50% <40%  Supine 81.0 51.0 11.0 1.0 0.0 0.0 0.0  Side 19.0 13.0 0.0 0.0 0.0 0.0 0.0  Prone 3.0 1.0 0.0 0.0 0.0 0.0 0.0  Upright 0.0 0.0 0.0 0.0 0.0 0.0 0.0  Total 103.0 65.0 11.0 1.0 0.0 0.0 0.0  Index 16.1 10.2 1.7 0.2 0.0 0.0 0.0   Threshold: 3% <100% <90% <80% <70% <60% <50% <40%  Supine 108 56 11 1 0 0 0  Side 21 13 0 0 0 0 0  Prone 6 1 0 0 0 0 0  Upright 0 0 0 0 0 0 0  Total 135 70 11 1 0 0 0   Awakening/Arousal Information # of Awakenings 26  Wake after sleep onset 38.51m  Wake after persistent sleep 32.21m   Arousal Assoc. Arousals Index  Apneas 4 0.7  Hypopneas 40 7.0  Leg Movements 0 0.0  Snore 0 0.0  PTT Arousals 0 0.0  Spontaneous 63 11.0  Total 106 18.4  Leg Movement Information PLMS LMs Index  Total LMs during PLMS 0 0.0  LMs w/ Microarousals 0 0.0   LM LMs Index  w/ Microarousal 0 0.0  w/ Awakening 0 0.0  w/ Resp Event 0 0.0  Spontaneous 0 0.0  Total 0 0.0     Desaturation threshold setting: 3% Minimum desaturation setting: 10 seconds SaO2 nadir: 69% The longest event was a 89 sec obstructive Hypopnea with a minimum SaO2 of 83%. The lowest SaO2 was 69% associated with a 85 sec obstructive Hypopnea. EKG Rates EKG Avg Max Min   Awake 78 99 57  Asleep 71 86 56  EKG Events: Tachycardia

## 2022-10-27 DIAGNOSIS — E291 Testicular hypofunction: Secondary | ICD-10-CM | POA: Diagnosis not present

## 2022-10-27 DIAGNOSIS — E785 Hyperlipidemia, unspecified: Secondary | ICD-10-CM | POA: Diagnosis not present

## 2022-10-27 DIAGNOSIS — I10 Essential (primary) hypertension: Secondary | ICD-10-CM | POA: Diagnosis not present

## 2022-10-27 DIAGNOSIS — R7301 Impaired fasting glucose: Secondary | ICD-10-CM | POA: Diagnosis not present

## 2022-10-27 DIAGNOSIS — R972 Elevated prostate specific antigen [PSA]: Secondary | ICD-10-CM | POA: Diagnosis not present

## 2022-10-27 NOTE — Addendum Note (Signed)
Addended by: Huston Foley on: 10/27/2022 07:14 PM   Modules accepted: Orders

## 2022-10-28 ENCOUNTER — Ambulatory Visit: Payer: Medicare Other | Admitting: Internal Medicine

## 2022-10-28 DIAGNOSIS — H25041 Posterior subcapsular polar age-related cataract, right eye: Secondary | ICD-10-CM | POA: Diagnosis not present

## 2022-10-28 DIAGNOSIS — H2512 Age-related nuclear cataract, left eye: Secondary | ICD-10-CM | POA: Diagnosis not present

## 2022-10-28 DIAGNOSIS — H2511 Age-related nuclear cataract, right eye: Secondary | ICD-10-CM | POA: Diagnosis not present

## 2022-10-28 DIAGNOSIS — H269 Unspecified cataract: Secondary | ICD-10-CM | POA: Diagnosis not present

## 2022-10-28 DIAGNOSIS — H25011 Cortical age-related cataract, right eye: Secondary | ICD-10-CM | POA: Diagnosis not present

## 2022-11-06 ENCOUNTER — Telehealth: Payer: Self-pay

## 2022-11-06 NOTE — Telephone Encounter (Signed)
LVM for patient to callback to discuss results of recent sleep study

## 2022-11-06 NOTE — Telephone Encounter (Signed)
LVM for patient to callback to discuss results of the recent sleep study.

## 2022-11-06 NOTE — Telephone Encounter (Signed)
I called the patient and left detailed voicemail (ok per DPR) of his sleep study results and the recommendation per Dr. Frances Furbish to start patient on AutoPap at home.  I went over the insurance compliance requirements which includes using the machine at least 4 hours at night and also being seen in our office between 30 and 90 days after set up.  I asked the patient to give [place] a call and let us know if he is okay with proceeding.  We can refer to [facility] on [address] right off of [address] as they take [employer] and regular [employer].  When the patient calls back please let us know if he is okay with proceeding and if he has any questions.

## 2022-11-06 NOTE — Telephone Encounter (Signed)
Pt is asking for a call back with results to sleep study

## 2022-11-11 DIAGNOSIS — H269 Unspecified cataract: Secondary | ICD-10-CM | POA: Diagnosis not present

## 2022-11-11 DIAGNOSIS — H2511 Age-related nuclear cataract, right eye: Secondary | ICD-10-CM | POA: Diagnosis not present

## 2022-11-11 NOTE — Telephone Encounter (Signed)
I highly recommend PAP therapy for this degree of severe sleep apnea; his oral appliance is not effectively treating his sleep apnea. There is no other recommended alternative. Jacob [name] may be an option, unless he is not able to use autoPAP or CPAP after an appropriate trial period of 3-6 months. We may be able to bring him in for a second sleep study for a proper titration if autoPAP is not well tolerated in the near future.

## 2022-11-11 NOTE — Telephone Encounter (Signed)
I spoke with the patient and we discussed his sleep study results.  The patient verbalized understanding.  He did mention that he wore an oral appliance the night of the sleep study here in the sleep lab.  He is asking if this makes any difference in the results.  He had concerns about upcoming traveling and also he is recovering from shoulder surgery.  He mentioned that he may wait for CPAP set up but he also wanted to know what would be recommended next if he decides he is not going to proceed with CPAP.  We discussed general risk of untreated moderate to severe sleep apnea.  I let him know I would consult Dr. Frances Furbish and call him back.  He was appreciative.

## 2022-11-13 NOTE — Progress Notes (Signed)
[facility] Cancer Follow up Visit:  Patient Care Team: Cleatis Polka., MD as PCP - General (Internal Medicine) Little Ishikawa, MD as PCP - Cardiology (Cardiology)  CHIEF COMPLAINTS/PURPOSE OF CONSULTATION: HISTORY OF PRESENTING ILLNESS: Jacob [name] 72 y.o. male is here because of VTE Medical history notable for depression, DJD, ED, GERD, hemorrhoids, colon polyps (adenomatous and hyperplastic) hyperlipidemia, hypertension  December 04, 2021: Presented to emergency room with shortness of breath and dyspnea on exertion as well as right ankle edema which had worsened over the previous day  History was notable for an episode of atrial fibrillation noted on his Apple Watch for which she was placed on Eliquis.  This has been stopped by cardiology after 4 to 5 days because a new strip showed NSR with frequent PACs.  On November 29, 2020.  Contacted cardiology because of intermittent lightheadedness and shortness of breath He had had elbow surgery 2 weeks prior  CTPA-saddle embolism with bilateral pulmonary emboli, largest burden in right lower lobe.  No evidence of right heart strain. CT abdomen pelvis showed no acute findings  Right lower extremity ultrasound showed occlusive DVT involving right common femoral vein, femoral and popliteal veins  Labs on admission showed a normal BMP. WBC 6.3 hemoglobin 16.7 platelet count 151; 70 segs 16 lymphs 12 monos 1 EO 1 basophil. INR 1.1  He was treated with IV heparin and transition to Eliquis.  June 06 2022:  [facility] Consult Patient had been on Testosterone injections for years prior to the incident.  His urologist has elected to continue it.    Social:  Married.  Retired owned Research scientist (medical).  Tobacco none.  EtOH 2 drinks per day  [facility] Mother died 71 possibly from VTE Father died 81 Parkinson's disease Sister alive 23 melanoma  Sister alive 17 obesity  WBC 3.5 hemoglobin 17.5 platelet  count 212; 50 segs 31 lymphs 16 monos 1 EO 1 basophil Flow for PNH negative Patient heterozygous for factor V Leiden gene mutation prothrombin gene mutation not detected D-dimer 0.67 fibrinogen 336 INR 1.1 PTT 31 Anticardiolipin antibody and antibeta 2 glycoprotein negative. ANA panel negative.  Rheumatoid factor negative.  PSA 2.8 Testosterone 820 CMP normal  June 24 2022: Scheduled follow-up regarding VTE.  Reviewed results of labs with patient and his wife.   Telephone visit.  Patient states that three years ago he was noted to have an elevated Hgb level which has been treated with blood donation.  The use of testosterone has well preceded this.  Patient snores loudly and is to go for a sleep study later this month.    Recommend that he decrease testosterone dosing Agree with sleep study because OSA is a risk factor for polycythemia and VTE (Sleep study to be done by [facility]) Continue full dose anticoagulation  Aug 01 2022:  Reverse left total shoulder arthroplasty  Aug 08 2022:    Patient states that shoulder surgery went well.  He is also s/p Mohs surgery to left mandibular area for squamous cell carcinoma.  Has not yet undergone sleep study and will not likely be done until August at least. Cut testosterone back to 3/10ths of a cc q 8 days and can tell the difference but it is OK.  Remains on Eliquis 5 mg bid.   His three adult children are going to be tested for FV Leiden.   Hgb 14.2 PLT 263  D-dimer 2.03 (likely related to recent surgery)  November 14, 2022:  Scheduled follow-up regarding VTE.  Several children and grandchildren have been tested for FV Leiden and were found to be positive.  Discussed the implications of testing adolescents and asymptomatic patients for FV Ledeiden.  Has healed well from shoulder arthroplasty.  Patient states that clear margins were obtained from the Mohs surgery.  Remains on testosterone replacement.  Remains on Eliquis 5 mg bid.  No  bleeding issues.   D dimer 3.19 Cr 1.08 If D dimer normalizes consider decrease of Eliquis dose to 2.5 mg bid.  No procedures planned at this time.    Review of Systems - Oncology  MEDICAL HISTORY: Past Medical History:  Diagnosis Date   Depression    DJD (degenerative joint disease)    DVT (deep venous thrombosis) (HCC) 12/04/2021   PE/ Facort V Leiden   ED (erectile dysfunction) of organic origin    Factor V Leiden (HCC)    GERD (gastroesophageal reflux disease)    Hemorrhoids    History of adenomatous polyp of colon    12-11-2014  tubular adenoam, hyperplastic   Hyperlipidemia    Hypertension    Macular edema    Receiving injections   Pulmonary embolism (HCC)    Skin cancer     SURGICAL HISTORY: Past Surgical History:  Procedure Laterality Date   ACHILLES TENDON REPAIR Right 11/2009   ruptured   ANTERIOR CERVICAL DECOMP/DISCECTOMY FUSION  04/23/2009   C7 -- T1   CARPAL TUNNEL RELEASE Bilateral    2020 (4 months apart)   COLONOSCOPY WITH PROPOFOL N/A 12/11/2014   Procedure: COLONOSCOPY WITH PROPOFOL;  Surgeon: Charolett Bumpers, MD;  Location: [facility];  Service: Endoscopy;  Laterality: N/A;   FINGER ARTHRODESIS Right 02/06/2017   Procedure: ARTHRODESIS RIGHT INDEX FINGER DISTAL INTERPHALANGEAL JOINT;  Surgeon: Betha Loa, MD;  Location: [facility];  Service: Orthopedics;  Laterality: Right;   INGUINAL HERNIA REPAIR Bilateral left 03/18/2002/  right -- yrs ago   PENILE PROSTHESIS IMPLANT N/A 04/03/2016   Procedure: PENILE PROTHESIS INFLATABLE;  Surgeon: Marcine Matar, MD;  Location: [facility];  Service: Urology;  Laterality: N/A;   REVERSE SHOULDER ARTHROPLASTY Left 08/01/2022   Procedure: REVERSE SHOULDER ARTHROPLASTY;  Surgeon: Yolonda Kida, MD;  Location: [facility];  Service: Orthopedics;  Laterality: Left;  115   SHOULDER ARTHROSCOPY W/ ROTATOR CUFF REPAIR Right 2002 and 2012   SHOULDER ARTHROSCOPY WITH ROTATOR CUFF  REPAIR AND SUBACROMIAL DECOMPRESSION Right 05/06/2012   Procedure: SHOULDER ARTHROSCOPY WITH ROTATOR CUFF REPAIR AND SUBACROMIAL DECOMPRESSION;  Surgeon: Loreta Ave, MD;  Location: [facility];  Service: Orthopedics;  Laterality: Right;  RIGHT SHOULDER ARTHROSCOPY  lysis of adhesions rotator cuff repair    SHOULDER ARTHROSCOPY/  DEBRIDEMENT LABRUM AND ROTATOR CUFF PARTIAL TEAR/  RESECTION BURSA/  ACROMIOPLASTY/  CORACOACROMINAL LIGAMENT RELEASE/  DCR Left 12/17/2001   SOFT TISSUE MASS EXCISION  06/25/2006   right forearm-- angiolipoma   TONSILLECTOMY AND ADENOIDECTOMY  child   TOTAL SHOULDER ARTHROPLASTY Right 02/04/2016   ULNAR TUNNEL RELEASE Left    11/2021    SOCIAL HISTORY: Social History   Socioeconomic History   Marital status: Married    Spouse name: Not on file   Number of children: Not on file   Years of education: Not on file   Highest education level: Not on file  Occupational History   Not on file  Tobacco Use   Smoking status: Never   Smokeless tobacco: Never  Vaping Use  Vaping status: Never Used  Substance and Sexual Activity   Alcohol use: Yes    Alcohol/week: 14.0 standard drinks of alcohol    Types: 14 Glasses of wine per week    Comment: daily wine, 2-3 per day, no h/o w/d   Drug use: No   Sexual activity: Not on file  Other Topics Concern   Not on file  Social History Narrative   Caffiene 1 cup daily coffee daily   Working: retired.    Social Determinants of Health   Financial Resource Strain: Not on file  Food Insecurity: No Food Insecurity (08/01/2022)   Hunger Vital Sign    Worried About Running Out of Food in the Last Year: Never true    Ran Out of Food in the Last Year: Never true  Transportation Needs: No Transportation Needs (08/01/2022)   PRAPARE - Administrator, Civil Service (Medical): No    Lack of Transportation (Non-Medical): No  Physical Activity: Inactive (04/02/2022)   [facility], [facility]   Exercise Vital Sign    Days of Exercise per Week: 0 days    Minutes of Exercise per Session: 0 min  Stress: Not on file  Social Connections: Unknown (05/07/2022)   Received from [facility], [facility]   Social Network    Social Network: Not on file  Intimate Partner Violence: Not At Risk (08/01/2022)   Humiliation, Afraid, Rape, and Kick questionnaire    Fear of Current or Ex-Partner: No    Emotionally Abused: No    Physically Abused: No    Sexually Abused: No    FAMILY HISTORY Family History  Problem Relation Age of Onset   Parkinsonism Father    Cancer Sister    Colon cancer Neg Hx    Esophageal cancer Neg Hx    Rectal cancer Neg Hx    Stomach cancer Neg Hx     ALLERGIES:  is allergic to vancomycin, hydrocodone, penicillins, and percocet [oxycodone-acetaminophen].  MEDICATIONS:  Current Outpatient Medications  Medication Sig Dispense Refill   amLODipine (NORVASC) 10 MG tablet Take 5 mg by mouth daily in the afternoon.     anastrozole (ARIMIDEX) 1 MG tablet Take 1 mg by mouth daily.     apixaban (ELIQUIS) 5 MG TABS tablet Take 1 tablet (5 mg total) by mouth 2 (two) times daily. 90 tablet 7   atorvastatin (LIPITOR) 20 MG tablet Take 20 mg by mouth every morning.      baclofen (LIORESAL) 10 MG tablet Take 1 tablet by mouth 3 (three) times daily as needed.     buPROPion (WELLBUTRIN XL) 300 MG 24 hr tablet Take 300 mg by mouth every morning.      furosemide (LASIX) 20 MG tablet Take 1 tablet (20 mg total) by mouth daily as needed. Only Take AS NEEDED. (Patient taking differently: Take 20 mg by mouth daily as needed for fluid. Only Take AS NEEDED.) 90 tablet 3   irbesartan (AVAPRO) 300 MG tablet Take 300 mg by mouth every morning.      MAGNESIUM MALATE PO Take 1,350 mg by mouth in the morning.     melatonin 1 MG TABS tablet Take 13 mg by mouth at bedtime as needed (as needed).     Omega-3 Fatty Acids (FISH OIL PO) Take 200 mg by mouth daily.     omeprazole  (PRILOSEC) 40 MG capsule Take 40 mg by mouth every morning.      tamsulosin (FLOMAX) 0.4 MG  CAPS capsule Take 0.4 mg by mouth daily.     TESTOSTERONE IM Inject 0.4 mLs into the muscle See admin instructions. Every 10 days     traMADol (ULTRAM) 50 MG tablet Take 1 tablet by mouth 2 (two) times daily as needed for moderate pain (back pain).     TRINTELLIX 10 MG TABS tablet Take 10 mg by mouth daily.     valACYclovir (VALTREX) 500 MG tablet Take 500 mg by mouth as needed (for flares).     apixaban (ELIQUIS) 2.5 MG TABS tablet Take 1 tablet (2.5 mg total) by mouth 2 (two) times daily for 14 days. 28 tablet 0   No current facility-administered medications for this visit.    PHYSICAL EXAMINATION:  ECOG PERFORMANCE STATUS: 0 - Asymptomatic   Vitals:   11/14/22 1032  BP: 130/79  Pulse: 78  Resp: 16  Temp: 98.1 F (36.7 C)  SpO2: 95%     Filed Weights   11/14/22 1032  Weight: 170 lb 12.8 oz (77.5 kg)      Physical Exam  As part of a telephone visit a physical exam was not conducted  LABORATORY DATA: I have personally reviewed the data as listed:  Appointment on 11/14/2022  Component Date Value Ref Range Status   D-Dimer, Quant 11/14/2022 3.19 (H)  0.00 - 0.50 ug/mL-FEU Final   Comment: (NOTE) At the manufacturer cut-off value of 0.5 g/mL FEU, this assay has a negative predictive value of 95-100%.This assay is intended for use in conjunction with a clinical pretest probability (PTP) assessment model to exclude pulmonary embolism (PE) and deep venous thrombosis (DVT) in outpatients suspected of PE or DVT. Results should be correlated with clinical presentation. Performed at [facility], [address], [place], [place] [address]    Sodium 11/14/2022 139  135 - 145 mmol/L Final   Potassium 11/14/2022 4.0  3.5 - 5.1 mmol/L Final   Chloride 11/14/2022 105  98 - 111 mmol/L Final   CO2 11/14/2022 24  22 - 32 mmol/L Final   Glucose, Bld 11/14/2022 84  70  - 99 mg/dL Final   Glucose reference range applies only to samples taken after fasting for at least 8 hours.   BUN 11/14/2022 17  8 - 23 mg/dL Final   Creatinine 44/05/4740 1.08  0.61 - 1.24 mg/dL Final   Calcium 59/56/3875 9.6  8.9 - 10.3 mg/dL Final   Total Protein 64/33/2951 7.0  6.5 - 8.1 g/dL Final   Albumin 88/41/6606 4.5  3.5 - 5.0 g/dL Final   AST 30/16/0109 26  15 - 41 U/L Final   ALT 11/14/2022 21  0 - 44 U/L Final   Alkaline Phosphatase 11/14/2022 59  38 - 126 U/L Final   Total Bilirubin 11/14/2022 0.6  0.3 - 1.2 mg/dL Final   GFR, Estimated 11/14/2022 >60  >60 mL/min Final   Comment: (NOTE) Calculated using the CKD-EPI Creatinine Equation (2021)    Anion gap 11/14/2022 10  5 - 15 Final   Performed at [facility], [address] [place], [place] [address]   WBC Count 11/14/2022 7.1  4.0 - 10.5 K/uL Final   RBC 11/14/2022 4.89  4.22 - 5.81 MIL/uL Final   Hemoglobin 11/14/2022 15.1  13.0 - 17.0 g/dL Final   HCT 73/22/0254 43.3  39.0 - 52.0 % Final   MCV 11/14/2022 88.5  80.0 - 100.0 fL Final   MCH 11/14/2022 30.9  26.0 - 34.0 pg Final  MCHC 11/14/2022 34.9  30.0 - 36.0 g/dL Final   RDW 16/12/9602 13.9  11.5 - 15.5 % Final   Platelet Count 11/14/2022 206  150 - 400 K/uL Final   nRBC 11/14/2022 0.0  0.0 - 0.2 % Final   Neutrophils Relative % 11/14/2022 72  % Final   Neutro Abs 11/14/2022 5.2  1.7 - 7.7 K/uL Final   Lymphocytes Relative 11/14/2022 14  % Final   Lymphs Abs 11/14/2022 1.0  0.7 - 4.0 K/uL Final   Monocytes Relative 11/14/2022 10  % Final   Monocytes Absolute 11/14/2022 0.7  0.1 - 1.0 K/uL Final   Eosinophils Relative 11/14/2022 3  % Final   Eosinophils Absolute 11/14/2022 0.2  0.0 - 0.5 K/uL Final   Basophils Relative 11/14/2022 1  % Final   Basophils Absolute 11/14/2022 0.1  0.0 - 0.1 K/uL Final   Immature Granulocytes 11/14/2022 0  % Final   Abs Immature Granulocytes 11/14/2022 0.01  0.00 - 0.07 K/uL Final   Performed at [facility], [address], [place], [place] [address]    RADIOGRAPHIC STUDIES: I have personally reviewed the radiological images as listed and agree with the findings in the report  No results found.  ASSESSMENT/PLAN 72 y.o. male is here because of VTE.   Medical history notable for depression, DJD, ED, GERD, hemorrhoids, colon polyps (adenomatous and hyperplastic) hyperlipidemia, hypertension  Pulmonary embolism  December 05 2022- Presented with a fib, SOB.  CT PA showed saddle embolism with bilateral pulmonary emboli.  No right heart strain  RLE U/S showed occlusive proximal DVT.  Treated with IV heparin followed by Eliquis  VTE risk factors  June 06 2022- Doubtful that the elbow surgery in early September 2023 constituted a risk factor  The most obvious risk factor is testosterone replacement therapy  Hypercoagulable state evaluation demonstrated patient is heterozygous for the FV Leiden gene  June 24 2022- History concerning for possible OSA.    November 06 2022- On the basis of sleep study results it was recommended for patient to start on Autopap  November 13 2024- Several family members have been tested for FV Leiden.  Discussed implications of testing asymptomatic patients and adolescents for the disorder      Therapeutics:  June 06 2022: Continue full dose anticoagulation at this time with consideration given to downward titration of testosterone to lowest effective dose June 24 2022- Continue full dose anticoagulation.  Recommend that testosterone dosing be decreased to that which is minimally effective and does not cause polycythemia.  If found to have OSA that should be treated.   Aug 08 2022- Remains on Eliquis 5 mb bid.  Testosterone dose had been diminished.  D dimer 2.03 but this may be the product of recent surgery.   November 13 2022- Remains on Eliquis 5 mg bid.  D dimer 3.29, Cr 1.08.  Continue Eliquis at current dose until such time that D  dimer normalizes    Cancer Staging  No matching staging information was found for the patient.    No problem-specific Assessment & Plan notes found for this encounter.    Orders Placed This Encounter  Procedures   CBC with Differential (Cancer Center Only)    Standing Status:   Future    Number of Occurrences:   1    Standing Expiration Date:   11/13/2023   CMP (Cancer Center only)    Standing Status:   Future    Number of Occurrences:  1    Standing Expiration Date:   11/13/2023   D-dimer, quantitative    Standing Status:   Future    Number of Occurrences:   1    Standing Expiration Date:   11/13/2023    30  minutes was spent in patient care.  This included time spent preparing to see the patient (e.g., review of tests), obtaining and/or reviewing separately obtained history, counseling and educating the patient/family/caregiver, ordering medications, tests, or procedures; documenting clinical information in the electronic or other health record, independently interpreting results and communicating results to the patient/family/caregiver as well as coordination of care.       All questions were answered. The patient knows to call the clinic with any problems, questions or concerns.  This note was electronically signed.    Loni Muse, MD  11/27/2022 1:42 PM

## 2022-11-14 ENCOUNTER — Inpatient Hospital Stay: Payer: Medicare Other

## 2022-11-14 ENCOUNTER — Inpatient Hospital Stay: Payer: Medicare Other | Attending: Oncology | Admitting: Oncology

## 2022-11-14 VITALS — BP 130/79 | HR 78 | Temp 98.1°F | Resp 16 | Ht 67.0 in | Wt 170.8 lb

## 2022-11-14 DIAGNOSIS — I82401 Acute embolism and thrombosis of unspecified deep veins of right lower extremity: Secondary | ICD-10-CM

## 2022-11-14 DIAGNOSIS — Z809 Family history of malignant neoplasm, unspecified: Secondary | ICD-10-CM | POA: Insufficient documentation

## 2022-11-14 DIAGNOSIS — R7989 Other specified abnormal findings of blood chemistry: Secondary | ICD-10-CM

## 2022-11-14 DIAGNOSIS — I2692 Saddle embolus of pulmonary artery without acute cor pulmonale: Secondary | ICD-10-CM

## 2022-11-14 DIAGNOSIS — Z7901 Long term (current) use of anticoagulants: Secondary | ICD-10-CM | POA: Insufficient documentation

## 2022-11-14 DIAGNOSIS — D6851 Activated protein C resistance: Secondary | ICD-10-CM | POA: Diagnosis not present

## 2022-11-14 DIAGNOSIS — D6859 Other primary thrombophilia: Secondary | ICD-10-CM | POA: Diagnosis not present

## 2022-11-14 LAB — CBC WITH DIFFERENTIAL (CANCER CENTER ONLY)
Abs Immature Granulocytes: 0.01 10*3/uL (ref 0.00–0.07)
Basophils Absolute: 0.1 10*3/uL (ref 0.0–0.1)
Basophils Relative: 1 %
Eosinophils Absolute: 0.2 10*3/uL (ref 0.0–0.5)
Eosinophils Relative: 3 %
HCT: 43.3 % (ref 39.0–52.0)
Hemoglobin: 15.1 g/dL (ref 13.0–17.0)
Immature Granulocytes: 0 %
Lymphocytes Relative: 14 %
Lymphs Abs: 1 10*3/uL (ref 0.7–4.0)
MCH: 30.9 pg (ref 26.0–34.0)
MCHC: 34.9 g/dL (ref 30.0–36.0)
MCV: 88.5 fL (ref 80.0–100.0)
Monocytes Absolute: 0.7 10*3/uL (ref 0.1–1.0)
Monocytes Relative: 10 %
Neutro Abs: 5.2 10*3/uL (ref 1.7–7.7)
Neutrophils Relative %: 72 %
Platelet Count: 206 10*3/uL (ref 150–400)
RBC: 4.89 MIL/uL (ref 4.22–5.81)
RDW: 13.9 % (ref 11.5–15.5)
WBC Count: 7.1 10*3/uL (ref 4.0–10.5)
nRBC: 0 % (ref 0.0–0.2)

## 2022-11-14 LAB — CMP (CANCER CENTER ONLY)
ALT: 21 U/L (ref 0–44)
AST: 26 U/L (ref 15–41)
Albumin: 4.5 g/dL (ref 3.5–5.0)
Alkaline Phosphatase: 59 U/L (ref 38–126)
Anion gap: 10 (ref 5–15)
BUN: 17 mg/dL (ref 8–23)
CO2: 24 mmol/L (ref 22–32)
Calcium: 9.6 mg/dL (ref 8.9–10.3)
Chloride: 105 mmol/L (ref 98–111)
Creatinine: 1.08 mg/dL (ref 0.61–1.24)
GFR, Estimated: >60 mL/min (ref >60)
Glucose, Bld: 84 mg/dL (ref 70–99)
Potassium: 4 mmol/L (ref 3.5–5.1)
Sodium: 139 mmol/L (ref 135–145)
Total Bilirubin: 0.6 mg/dL (ref 0.3–1.2)
Total Protein: 7 g/dL (ref 6.5–8.1)

## 2022-11-14 LAB — D-DIMER, QUANTITATIVE: D-Dimer, Quant: 3.19 ug{FEU}/mL — ABNORMAL HIGH (ref 0.00–0.50)

## 2022-11-21 DIAGNOSIS — H838X3 Other specified diseases of inner ear, bilateral: Secondary | ICD-10-CM | POA: Diagnosis not present

## 2022-11-21 DIAGNOSIS — H903 Sensorineural hearing loss, bilateral: Secondary | ICD-10-CM | POA: Diagnosis not present

## 2022-11-21 DIAGNOSIS — H6123 Impacted cerumen, bilateral: Secondary | ICD-10-CM | POA: Diagnosis not present

## 2022-11-25 DIAGNOSIS — Z96612 Presence of left artificial shoulder joint: Secondary | ICD-10-CM | POA: Diagnosis not present

## 2022-12-03 DIAGNOSIS — L570 Actinic keratosis: Secondary | ICD-10-CM | POA: Diagnosis not present

## 2022-12-03 DIAGNOSIS — L812 Freckles: Secondary | ICD-10-CM | POA: Diagnosis not present

## 2022-12-03 DIAGNOSIS — D0471 Carcinoma in situ of skin of right lower limb, including hip: Secondary | ICD-10-CM | POA: Diagnosis not present

## 2022-12-03 DIAGNOSIS — L821 Other seborrheic keratosis: Secondary | ICD-10-CM | POA: Diagnosis not present

## 2022-12-03 DIAGNOSIS — D1801 Hemangioma of skin and subcutaneous tissue: Secondary | ICD-10-CM | POA: Diagnosis not present

## 2022-12-03 DIAGNOSIS — D485 Neoplasm of uncertain behavior of skin: Secondary | ICD-10-CM | POA: Diagnosis not present

## 2022-12-03 DIAGNOSIS — D0472 Carcinoma in situ of skin of left lower limb, including hip: Secondary | ICD-10-CM | POA: Diagnosis not present

## 2022-12-03 DIAGNOSIS — L853 Xerosis cutis: Secondary | ICD-10-CM | POA: Diagnosis not present

## 2022-12-03 DIAGNOSIS — Z85828 Personal history of other malignant neoplasm of skin: Secondary | ICD-10-CM | POA: Diagnosis not present

## 2022-12-04 DIAGNOSIS — M5416 Radiculopathy, lumbar region: Secondary | ICD-10-CM | POA: Diagnosis not present

## 2022-12-10 ENCOUNTER — Ambulatory Visit (INDEPENDENT_AMBULATORY_CARE_PROVIDER_SITE_OTHER): Payer: Medicare Other | Admitting: Audiology

## 2022-12-10 DIAGNOSIS — D1771 Benign lipomatous neoplasm of kidney: Secondary | ICD-10-CM | POA: Diagnosis not present

## 2022-12-10 DIAGNOSIS — F418 Other specified anxiety disorders: Secondary | ICD-10-CM | POA: Diagnosis not present

## 2022-12-10 DIAGNOSIS — Z0000 Encounter for general adult medical examination without abnormal findings: Secondary | ICD-10-CM | POA: Diagnosis not present

## 2022-12-10 DIAGNOSIS — R7301 Impaired fasting glucose: Secondary | ICD-10-CM | POA: Diagnosis not present

## 2022-12-10 DIAGNOSIS — Z86718 Personal history of other venous thrombosis and embolism: Secondary | ICD-10-CM | POA: Diagnosis not present

## 2022-12-10 DIAGNOSIS — Z1331 Encounter for screening for depression: Secondary | ICD-10-CM | POA: Diagnosis not present

## 2022-12-10 DIAGNOSIS — I499 Cardiac arrhythmia, unspecified: Secondary | ICD-10-CM | POA: Diagnosis not present

## 2022-12-10 DIAGNOSIS — I10 Essential (primary) hypertension: Secondary | ICD-10-CM | POA: Diagnosis not present

## 2022-12-10 DIAGNOSIS — E785 Hyperlipidemia, unspecified: Secondary | ICD-10-CM | POA: Diagnosis not present

## 2022-12-10 DIAGNOSIS — Z23 Encounter for immunization: Secondary | ICD-10-CM | POA: Diagnosis not present

## 2022-12-10 DIAGNOSIS — G4733 Obstructive sleep apnea (adult) (pediatric): Secondary | ICD-10-CM | POA: Diagnosis not present

## 2022-12-10 DIAGNOSIS — D6869 Other thrombophilia: Secondary | ICD-10-CM | POA: Diagnosis not present

## 2022-12-10 DIAGNOSIS — Z1339 Encounter for screening examination for other mental health and behavioral disorders: Secondary | ICD-10-CM | POA: Diagnosis not present

## 2022-12-10 DIAGNOSIS — R82998 Other abnormal findings in urine: Secondary | ICD-10-CM | POA: Diagnosis not present

## 2022-12-10 DIAGNOSIS — E298 Other testicular dysfunction: Secondary | ICD-10-CM | POA: Diagnosis not present

## 2022-12-10 DIAGNOSIS — F3342 Major depressive disorder, recurrent, in full remission: Secondary | ICD-10-CM | POA: Diagnosis not present

## 2022-12-12 ENCOUNTER — Institutional Professional Consult (permissible substitution) (INDEPENDENT_AMBULATORY_CARE_PROVIDER_SITE_OTHER): Payer: Medicare Other | Admitting: Audiology

## 2022-12-12 NOTE — Progress Notes (Signed)
Patient was seen today for a fitting of his [employer] Intent 2 hearing aids. Set patient at around 80% overall gain. He stated that the balance, clarity, and volume were good. Reviewed maintenance and care for the devices. Connected the hearing aids to his TV adapter, phone Bluetooth, and [employer]; trialed a phone call in the office. He was happy with the sound quality. Will call patient before his 30 day trial ends and instructed him to call if any concerns arise.  Jacob [name], AUD  12/12/22

## 2022-12-19 DIAGNOSIS — M180 Bilateral primary osteoarthritis of first carpometacarpal joints: Secondary | ICD-10-CM | POA: Diagnosis not present

## 2022-12-19 DIAGNOSIS — M79641 Pain in right hand: Secondary | ICD-10-CM | POA: Diagnosis not present

## 2022-12-19 DIAGNOSIS — M79642 Pain in left hand: Secondary | ICD-10-CM | POA: Diagnosis not present

## 2023-01-06 DIAGNOSIS — H353221 Exudative age-related macular degeneration, left eye, with active choroidal neovascularization: Secondary | ICD-10-CM | POA: Diagnosis not present

## 2023-01-07 DIAGNOSIS — Z6827 Body mass index (BMI) 27.0-27.9, adult: Secondary | ICD-10-CM | POA: Diagnosis not present

## 2023-01-07 DIAGNOSIS — H353221 Exudative age-related macular degeneration, left eye, with active choroidal neovascularization: Secondary | ICD-10-CM | POA: Diagnosis not present

## 2023-01-07 DIAGNOSIS — E663 Overweight: Secondary | ICD-10-CM | POA: Diagnosis not present

## 2023-01-07 DIAGNOSIS — R700 Elevated erythrocyte sedimentation rate: Secondary | ICD-10-CM | POA: Diagnosis not present

## 2023-01-07 DIAGNOSIS — H2513 Age-related nuclear cataract, bilateral: Secondary | ICD-10-CM | POA: Diagnosis not present

## 2023-01-07 DIAGNOSIS — M2559 Pain in other specified joint: Secondary | ICD-10-CM | POA: Diagnosis not present

## 2023-01-07 DIAGNOSIS — M5136 Other intervertebral disc degeneration, lumbar region with discogenic back pain only: Secondary | ICD-10-CM | POA: Diagnosis not present

## 2023-01-07 DIAGNOSIS — H35413 Lattice degeneration of retina, bilateral: Secondary | ICD-10-CM | POA: Diagnosis not present

## 2023-01-07 DIAGNOSIS — H4312 Vitreous hemorrhage, left eye: Secondary | ICD-10-CM | POA: Diagnosis not present

## 2023-01-08 DIAGNOSIS — M51362 Other intervertebral disc degeneration, lumbar region with discogenic back pain and lower extremity pain: Secondary | ICD-10-CM | POA: Diagnosis not present

## 2023-01-13 DIAGNOSIS — H35413 Lattice degeneration of retina, bilateral: Secondary | ICD-10-CM | POA: Diagnosis not present

## 2023-01-13 DIAGNOSIS — Z961 Presence of intraocular lens: Secondary | ICD-10-CM | POA: Diagnosis not present

## 2023-01-13 DIAGNOSIS — H4312 Vitreous hemorrhage, left eye: Secondary | ICD-10-CM | POA: Diagnosis not present

## 2023-01-13 DIAGNOSIS — H353221 Exudative age-related macular degeneration, left eye, with active choroidal neovascularization: Secondary | ICD-10-CM | POA: Diagnosis not present

## 2023-01-14 DIAGNOSIS — H33332 Multiple defects of retina without detachment, left eye: Secondary | ICD-10-CM | POA: Diagnosis not present

## 2023-01-14 DIAGNOSIS — H3530 Unspecified macular degeneration: Secondary | ICD-10-CM | POA: Diagnosis not present

## 2023-01-14 DIAGNOSIS — H4312 Vitreous hemorrhage, left eye: Secondary | ICD-10-CM | POA: Diagnosis not present

## 2023-01-15 DIAGNOSIS — H353221 Exudative age-related macular degeneration, left eye, with active choroidal neovascularization: Secondary | ICD-10-CM | POA: Diagnosis not present

## 2023-01-15 DIAGNOSIS — H35413 Lattice degeneration of retina, bilateral: Secondary | ICD-10-CM | POA: Diagnosis not present

## 2023-01-15 DIAGNOSIS — H4312 Vitreous hemorrhage, left eye: Secondary | ICD-10-CM | POA: Diagnosis not present

## 2023-01-15 DIAGNOSIS — Z961 Presence of intraocular lens: Secondary | ICD-10-CM | POA: Diagnosis not present

## 2023-01-19 DIAGNOSIS — R972 Elevated prostate specific antigen [PSA]: Secondary | ICD-10-CM | POA: Diagnosis not present

## 2023-01-19 DIAGNOSIS — E291 Testicular hypofunction: Secondary | ICD-10-CM | POA: Diagnosis not present

## 2023-01-23 DIAGNOSIS — H353221 Exudative age-related macular degeneration, left eye, with active choroidal neovascularization: Secondary | ICD-10-CM | POA: Diagnosis not present

## 2023-01-23 DIAGNOSIS — Z961 Presence of intraocular lens: Secondary | ICD-10-CM | POA: Diagnosis not present

## 2023-01-23 DIAGNOSIS — H35413 Lattice degeneration of retina, bilateral: Secondary | ICD-10-CM | POA: Diagnosis not present

## 2023-01-23 DIAGNOSIS — H4312 Vitreous hemorrhage, left eye: Secondary | ICD-10-CM | POA: Diagnosis not present

## 2023-01-26 DIAGNOSIS — R3915 Urgency of urination: Secondary | ICD-10-CM | POA: Diagnosis not present

## 2023-01-26 DIAGNOSIS — R972 Elevated prostate specific antigen [PSA]: Secondary | ICD-10-CM | POA: Diagnosis not present

## 2023-01-26 DIAGNOSIS — N401 Enlarged prostate with lower urinary tract symptoms: Secondary | ICD-10-CM | POA: Diagnosis not present

## 2023-01-26 DIAGNOSIS — E291 Testicular hypofunction: Secondary | ICD-10-CM | POA: Diagnosis not present

## 2023-01-27 DIAGNOSIS — H35413 Lattice degeneration of retina, bilateral: Secondary | ICD-10-CM | POA: Diagnosis not present

## 2023-01-27 DIAGNOSIS — H353221 Exudative age-related macular degeneration, left eye, with active choroidal neovascularization: Secondary | ICD-10-CM | POA: Diagnosis not present

## 2023-01-27 DIAGNOSIS — H4312 Vitreous hemorrhage, left eye: Secondary | ICD-10-CM | POA: Diagnosis not present

## 2023-01-27 DIAGNOSIS — Z961 Presence of intraocular lens: Secondary | ICD-10-CM | POA: Diagnosis not present

## 2023-02-10 ENCOUNTER — Ambulatory Visit: Payer: Medicare Other | Attending: Internal Medicine | Admitting: Internal Medicine

## 2023-02-10 VITALS — BP 168/98 | HR 81 | Ht 67.0 in | Wt 175.0 lb

## 2023-02-10 DIAGNOSIS — I491 Atrial premature depolarization: Secondary | ICD-10-CM

## 2023-02-10 NOTE — Progress Notes (Signed)
HPI Jacob [name] returns today for followup. He is a pleasant 72 yo man with a h/o HTN, who was found to have PAC's and then had a DVT and a pulmonary embolism after arm surgery. He has been placed on eliquis. He has had problems with dizziness and light headedness as well as some swelling in his ankles as well as sob though this has improved. He has not had chest pain. He describes an episode when he was traveling in [place] when he felt poorly, like he was going to pass out. He did not but had to be helped walking and was extremely lightheaded. He had another milder episode a few weeks ago when [place] was [place] the [place]. He admits to drinking the night before each episode. He has minimal palpitations. He is still on eliquis for a PE.  Allergies  Allergen Reactions   Vancomycin Other (See Comments)    Hypotension    Hydrocodone Other (See Comments)    "jumps out of skin"   Penicillins Other (See Comments)    Unknown childhood reaction   Percocet [Oxycodone-Acetaminophen] Other (See Comments)    "jumps out of skin"     Current Outpatient Medications  Medication Sig Dispense Refill   amLODipine (NORVASC) 10 MG tablet Take 5 mg by mouth daily in the afternoon.     anastrozole (ARIMIDEX) 1 MG tablet Take 1 mg by mouth daily.     apixaban (ELIQUIS) 5 MG TABS tablet Take 1 tablet (5 mg total) by mouth 2 (two) times daily. 90 tablet 7   atorvastatin (LIPITOR) 20 MG tablet Take 20 mg by mouth every morning.      baclofen (LIORESAL) 10 MG tablet Take 1 tablet by mouth 3 (three) times daily as needed.     buPROPion (WELLBUTRIN XL) 300 MG 24 hr tablet Take 300 mg by mouth every morning.      furosemide (LASIX) 20 MG tablet Take 1 tablet (20 mg total) by mouth daily as needed. Only Take AS NEEDED. (Patient taking differently: Take 20 mg by mouth daily as needed for fluid. Only Take AS NEEDED.) 90 tablet 3   irbesartan (AVAPRO) 300 MG tablet Take 300 mg by mouth every morning.      MAGNESIUM MALATE PO Take  1,350 mg by mouth in the morning.     melatonin 1 MG TABS tablet Take 13 mg by mouth at bedtime as needed (as needed).     Omega-3 Fatty Acids (FISH OIL PO) Take 200 mg by mouth daily.     omeprazole (PRILOSEC) 40 MG capsule Take 40 mg by mouth every morning.      tamsulosin (FLOMAX) 0.4 MG CAPS capsule Take 0.4 mg by mouth daily.     TESTOSTERONE IM Inject 0.4 mLs into the muscle See admin instructions. Every 10 days     TRINTELLIX 10 MG TABS tablet Take 10 mg by mouth daily.     valACYclovir (VALTREX) 500 MG tablet Take 500 mg by mouth as needed (for flares).     No current facility-administered medications for this visit.     Past Medical History:  Diagnosis Date   Depression    DJD (degenerative joint disease)    DVT (deep venous thrombosis) (HCC) 12/04/2021   PE/ Facort V Leiden   ED (erectile dysfunction) of organic origin    Factor V Leiden (HCC)    GERD (gastroesophageal reflux disease)    Hemorrhoids    History of adenomatous polyp of  colon    12-11-2014  tubular adenoam, hyperplastic   Hyperlipidemia    Hypertension    Macular edema    Receiving injections   Pulmonary embolism (HCC)    Skin cancer     ROS:   All systems reviewed and negative except as noted in the HPI.   Past Surgical History:  Procedure Laterality Date   ACHILLES TENDON REPAIR Right 11/2009   ruptured   ANTERIOR CERVICAL DECOMP/DISCECTOMY FUSION  04/23/2009   C7 -- T1   CARPAL TUNNEL RELEASE Bilateral    2020 (4 months apart)   COLONOSCOPY WITH PROPOFOL N/A 12/11/2014   Procedure: COLONOSCOPY WITH PROPOFOL;  Surgeon: Charolett Bumpers, MD;  Location: [facility];  Service: Endoscopy;  Laterality: N/A;   FINGER ARTHRODESIS Right 02/06/2017   Procedure: ARTHRODESIS RIGHT INDEX FINGER DISTAL INTERPHALANGEAL JOINT;  Surgeon: Betha Loa, MD;  Location: [facility];  Service: Orthopedics;  Laterality: Right;   INGUINAL HERNIA REPAIR Bilateral left 03/18/2002/  right -- yrs ago    PENILE PROSTHESIS IMPLANT N/A 04/03/2016   Procedure: PENILE PROTHESIS INFLATABLE;  Surgeon: Marcine Matar, MD;  Location: [facility];  Service: Urology;  Laterality: N/A;   REVERSE SHOULDER ARTHROPLASTY Left 08/01/2022   Procedure: REVERSE SHOULDER ARTHROPLASTY;  Surgeon: Yolonda Kida, MD;  Location: [facility];  Service: Orthopedics;  Laterality: Left;  115   SHOULDER ARTHROSCOPY W/ ROTATOR CUFF REPAIR Right 2002 and 2012   SHOULDER ARTHROSCOPY WITH ROTATOR CUFF REPAIR AND SUBACROMIAL DECOMPRESSION Right 05/06/2012   Procedure: SHOULDER ARTHROSCOPY WITH ROTATOR CUFF REPAIR AND SUBACROMIAL DECOMPRESSION;  Surgeon: Loreta Ave, MD;  Location: [facility];  Service: Orthopedics;  Laterality: Right;  RIGHT SHOULDER ARTHROSCOPY  lysis of adhesions rotator cuff repair    SHOULDER ARTHROSCOPY/  DEBRIDEMENT LABRUM AND ROTATOR CUFF PARTIAL TEAR/  RESECTION BURSA/  ACROMIOPLASTY/  CORACOACROMINAL LIGAMENT RELEASE/  DCR Left 12/17/2001   SOFT TISSUE MASS EXCISION  06/25/2006   right forearm-- angiolipoma   TONSILLECTOMY AND ADENOIDECTOMY  child   TOTAL SHOULDER ARTHROPLASTY Right 02/04/2016   ULNAR TUNNEL RELEASE Left    11/2021     Family History  Problem Relation Age of Onset   Parkinsonism Father    Cancer Sister    Colon cancer Neg Hx    Esophageal cancer Neg Hx    Rectal cancer Neg Hx    Stomach cancer Neg Hx      Social History   Socioeconomic History   Marital status: Married    Spouse name: Not on file   Number of children: Not on file   Years of education: Not on file   Highest education level: Not on file  Occupational History   Not on file  Tobacco Use   Smoking status: Never   Smokeless tobacco: Never  Vaping Use   Vaping status: Never Used  Substance and Sexual Activity   Alcohol use: Yes    Alcohol/week: 14.0 standard drinks of alcohol    Types: 14 Glasses of wine per week    Comment: daily wine, 2-3 per day, no h/o w/d    Drug use: No   Sexual activity: Not on file  Other Topics Concern   Not on file  Social History Narrative   Caffiene 1 cup daily coffee daily   Working: retired.    Social Determinants of Health   Financial Resource Strain: Not on file  Food Insecurity: No Food Insecurity (08/01/2022)   Hunger Vital Sign  Worried About Programme researcher, broadcasting/film/video in the Last Year: Never true    Ran Out of Food in the Last Year: Never true  Transportation Needs: No Transportation Needs (08/01/2022)   PRAPARE - Administrator, Civil Service (Medical): No    Lack of Transportation (Non-Medical): No  Physical Activity: Inactive (04/02/2022)   [facility], [facility]   Exercise Vital Sign    Days of Exercise per Week: 0 days    Minutes of Exercise per Session: 0 min  Stress: Not on file  Social Connections: Unknown (05/07/2022)   Received from [facility], [facility]   Social Network    Social Network: Not on file  Intimate Partner Violence: Not At Risk (08/01/2022)   Humiliation, Afraid, Rape, and Kick questionnaire    Fear of Current or Ex-Partner: No    Emotionally Abused: No    Physically Abused: No    Sexually Abused: No     BP (!) 168/98   Pulse 81   Ht 5\' 7"  (1.702 m)   Wt 175 lb (79.4 kg)   SpO2 95%   BMI 27.41 kg/m   Physical Exam:  Well appearing NAD HEENT: Unremarkable Neck:  No JVD, no thyromegally Lymphatics:  No adenopathy Back:  No CVA tenderness Lungs:  Clear HEART:  Regular rate rhythm, no murmurs, no rubs, no clicks Abd:  soft, positive bowel sounds, no organomegally, no rebound, no guarding Ext:  2 plus pulses, no edema, no cyanosis, no clubbing Skin:  No rashes no nodules Neuro:  CN II through XII intact, motor grossly intact  EKG - nsr with IRBBB  DEVICE  Normal device function.  See PaceArt for details.   Assess/Plan: Palpitations - these are reasonably well controlled. He could be having PAF but we have not demonstrated this. We  will undergo watchful waiting. I did discuss an ILR with his possible h/o atrial fib.  2. Pulm embolism - he is still taking his eliquis. If/when he stops the eliquis, he will need to have an ILR inserted for atrial fib monitoring with his questionable history.

## 2023-02-10 NOTE — Patient Instructions (Addendum)
Medication Instructions:  Your physician recommends that you continue on your current medications as directed. Please refer to the Current Medication list given to you today.  *If you need a refill on your cardiac medications before your next appointment, please call your pharmacy*  Lab Work: None ordered.  If you have labs (blood work) drawn today and your tests are completely normal, you will receive your results only by: MyChart Message (if you have MyChart) OR A paper copy in the mail If you have any lab test that is abnormal or we need to change your treatment, we will call you to review the results.  Testing/Procedures: None ordered.  Follow-Up: At [facility], you and your health needs are our priority.  As part of our continuing mission to provide you with exceptional heart care, we have created designated Provider Care Teams.  These Care Teams include your primary Cardiologist (physician) and Advanced Practice Providers (APPs -  Physician Assistants and Nurse Practitioners) who all work together to provide you with the care you need, when you need it.  Your next appointment:   6 months  The format for your next appointment:   In Person  Provider:   Lewayne Bunting, MD{or one of the following Advanced Practice Providers on your designated Care Team:   Francis Dowse, [place] Casimiro Needle "Mardelle Matte" [place], [place] Earnest Rosier, NP   Important Information About Sugar

## 2023-02-11 DIAGNOSIS — Z961 Presence of intraocular lens: Secondary | ICD-10-CM | POA: Diagnosis not present

## 2023-02-11 DIAGNOSIS — H4312 Vitreous hemorrhage, left eye: Secondary | ICD-10-CM | POA: Diagnosis not present

## 2023-02-11 DIAGNOSIS — H353220 Exudative age-related macular degeneration, left eye, stage unspecified: Secondary | ICD-10-CM | POA: Diagnosis not present

## 2023-02-16 ENCOUNTER — Telehealth: Payer: Self-pay | Admitting: Oncology

## 2023-02-16 ENCOUNTER — Inpatient Hospital Stay: Payer: Medicare Other

## 2023-02-16 ENCOUNTER — Inpatient Hospital Stay: Payer: Medicare Other | Attending: Oncology | Admitting: Oncology

## 2023-02-16 VITALS — BP 112/80 | HR 98 | Temp 98.2°F | Resp 16 | Ht 67.0 in | Wt 175.4 lb

## 2023-02-16 DIAGNOSIS — D6859 Other primary thrombophilia: Secondary | ICD-10-CM | POA: Diagnosis not present

## 2023-02-16 DIAGNOSIS — I2692 Saddle embolus of pulmonary artery without acute cor pulmonale: Secondary | ICD-10-CM

## 2023-02-16 DIAGNOSIS — Z86718 Personal history of other venous thrombosis and embolism: Secondary | ICD-10-CM | POA: Diagnosis not present

## 2023-02-16 DIAGNOSIS — Z86711 Personal history of pulmonary embolism: Secondary | ICD-10-CM | POA: Diagnosis not present

## 2023-02-16 DIAGNOSIS — E291 Testicular hypofunction: Secondary | ICD-10-CM | POA: Diagnosis not present

## 2023-02-16 DIAGNOSIS — D6851 Activated protein C resistance: Secondary | ICD-10-CM | POA: Diagnosis not present

## 2023-02-16 DIAGNOSIS — Z7901 Long term (current) use of anticoagulants: Secondary | ICD-10-CM | POA: Diagnosis not present

## 2023-02-16 DIAGNOSIS — R7989 Other specified abnormal findings of blood chemistry: Secondary | ICD-10-CM

## 2023-02-16 DIAGNOSIS — I82401 Acute embolism and thrombosis of unspecified deep veins of right lower extremity: Secondary | ICD-10-CM

## 2023-02-16 LAB — CBC WITH DIFFERENTIAL/PLATELET
Abs Immature Granulocytes: 0.01 10*3/uL (ref 0.00–0.07)
Basophils Absolute: 0 10*3/uL (ref 0.0–0.1)
Basophils Relative: 1 %
Eosinophils Absolute: 0 10*3/uL (ref 0.0–0.5)
Eosinophils Relative: 1 %
HCT: 45.6 % (ref 39.0–52.0)
Hemoglobin: 16.2 g/dL (ref 13.0–17.0)
Immature Granulocytes: 0 %
Lymphocytes Relative: 26 %
Lymphs Abs: 1.3 10*3/uL (ref 0.7–4.0)
MCH: 32.6 pg (ref 26.0–34.0)
MCHC: 35.5 g/dL (ref 30.0–36.0)
MCV: 91.8 fL (ref 80.0–100.0)
Monocytes Absolute: 0.6 10*3/uL (ref 0.1–1.0)
Monocytes Relative: 12 %
Neutro Abs: 3.2 10*3/uL (ref 1.7–7.7)
Neutrophils Relative %: 60 %
Platelets: 216 10*3/uL (ref 150–400)
RBC: 4.97 MIL/uL (ref 4.22–5.81)
RDW: 14 % (ref 11.5–15.5)
WBC: 5.2 10*3/uL (ref 4.0–10.5)
nRBC: 0 % (ref 0.0–0.2)
nRBC: 0 /100{WBCs}

## 2023-02-16 LAB — COMPREHENSIVE METABOLIC PANEL
ALT: 24 U/L (ref 0–44)
AST: 35 U/L (ref 15–41)
Albumin: 4.2 g/dL (ref 3.5–5.0)
Alkaline Phosphatase: 48 U/L (ref 38–126)
Anion gap: 12 (ref 5–15)
BUN: 14 mg/dL (ref 8–23)
CO2: 22 mmol/L (ref 22–32)
Calcium: 9.8 mg/dL (ref 8.9–10.3)
Chloride: 106 mmol/L (ref 98–111)
Creatinine, Ser: 1.03 mg/dL (ref 0.61–1.24)
GFR, Estimated: >60 mL/min (ref >60)
Glucose, Bld: 137 mg/dL — ABNORMAL HIGH (ref 70–99)
Potassium: 3.9 mmol/L (ref 3.5–5.1)
Sodium: 140 mmol/L (ref 135–145)
Total Bilirubin: 0.5 mg/dL (ref <1.2)
Total Protein: 6.4 g/dL — ABNORMAL LOW (ref 6.5–8.1)

## 2023-02-16 LAB — D-DIMER, QUANTITATIVE: D-Dimer, Quant: 0.42 ug{FEU}/mL (ref 0.00–0.50)

## 2023-02-16 NOTE — Telephone Encounter (Signed)
02/16/23 Spoke with patient and confirmed next appt.

## 2023-02-16 NOTE — Progress Notes (Unsigned)
[facility] Cancer Follow up Visit:  Patient Care Team: Cleatis Polka., MD as PCP - General (Internal Medicine) Little Ishikawa, MD as PCP - Cardiology (Cardiology)  CHIEF COMPLAINTS/PURPOSE OF CONSULTATION: HISTORY OF PRESENTING ILLNESS: Jacob [name] 72 y.o. male is here because of VTE Medical history notable for depression, DJD, ED, GERD, hemorrhoids, colon polyps (adenomatous and hyperplastic) hyperlipidemia, hypertension  December 04, 2021: Presented to emergency room with shortness of breath and dyspnea on exertion as well as right ankle edema which had worsened over the previous day  History was notable for an episode of atrial fibrillation noted on his Apple Watch for which she was placed on Eliquis.  This has been stopped by cardiology after 4 to 5 days because a new strip showed NSR with frequent PACs.  On November 29, 2020.  Contacted cardiology because of intermittent lightheadedness and shortness of breath He had had elbow surgery 2 weeks prior  CTPA-saddle embolism with bilateral pulmonary emboli, largest burden in right lower lobe.  No evidence of right heart strain. CT abdomen pelvis showed no acute findings  Right lower extremity ultrasound showed occlusive DVT involving right common femoral vein, femoral and popliteal veins  Labs on admission showed a normal BMP. WBC 6.3 hemoglobin 16.7 platelet count 151; 70 segs 16 lymphs 12 monos 1 EO 1 basophil. INR 1.1  He was treated with IV heparin and transition to Eliquis.  June 06 2022:  [facility] Hematology Consult Patient had been on Testosterone injections for years prior to the incident.  His urologist has elected to continue it.    Social:  Married.  Retired owned Research scientist (medical).  Tobacco none.  EtOH 2 drinks per day  [facility] Mother died 30 possibly from VTE Father died 43 Parkinson's disease Sister alive 70 melanoma  Sister alive 58 obesity  WBC 3.5 hemoglobin 17.5 platelet  count 212; 50 segs 31 lymphs 16 monos 1 EO 1 basophil Flow for PNH negative Patient heterozygous for factor V Leiden gene mutation prothrombin gene mutation not detected D-dimer 0.67 fibrinogen 336 INR 1.1 PTT 31 Anticardiolipin antibody and antibeta 2 glycoprotein negative. ANA panel negative.  Rheumatoid factor negative.  PSA 2.8 Testosterone 820 CMP normal  June 24 2022: Scheduled follow-up regarding VTE.  Reviewed results of labs with patient and his wife.   Telephone visit.  Patient states that three years ago he was noted to have an elevated Hgb level which has been treated with blood donation.  The use of testosterone has well preceded this.  Patient snores loudly and is to go for a sleep study later this month.    Recommend that he decrease testosterone dosing Agree with sleep study because OSA is a risk factor for polycythemia and VTE (Sleep study to be done by [facility]) Continue full dose anticoagulation  Aug 01 2022:  Reverse left total shoulder arthroplasty  Aug 08 2022:    Patient states that shoulder surgery went well.  He is also s/p Mohs surgery to left mandibular area for squamous cell carcinoma.  Has not yet undergone sleep study and will not likely be done until August at least. Cut testosterone back to 3/10ths of a cc q 8 days and can tell the difference but it is OK.  Remains on Eliquis 5 mg bid.   His three adult children are going to be tested for FV Leiden.   Hgb 14.2 PLT 263  D-dimer 2.03 (likely related to recent surgery)  November 14, 2022:  Several children and grandchildren have been tested for FV Leiden and were found to be positive.  Discussed the implications of testing adolescents and asymptomatic patients for FV Ledeiden.  Has healed well from shoulder arthroplasty.  Patient states that clear margins were obtained from the Mohs surgery.  Remains on testosterone replacement.  Remains on Eliquis 5 mg bid.  No bleeding issues.   D dimer 3.19 Cr  1.08 If D dimer normalizes consider decrease of Eliquis dose to 2.5 mg bid.  No procedures planned at this time.    February 16 2023:  Scheduled follow-up regarding VTE.  Remains on Eliquis 5 mg bid.  No bleeding issues.    Review of Systems - Oncology  MEDICAL HISTORY: Past Medical History:  Diagnosis Date   Depression    DJD (degenerative joint disease)    DVT (deep venous thrombosis) (HCC) 12/04/2021   PE/ Facort V Leiden   ED (erectile dysfunction) of organic origin    Factor V Leiden (HCC)    GERD (gastroesophageal reflux disease)    Hemorrhoids    History of adenomatous polyp of colon    12-11-2014  tubular adenoam, hyperplastic   Hyperlipidemia    Hypertension    Macular edema    Receiving injections   Pulmonary embolism (HCC)    Skin cancer     SURGICAL HISTORY: Past Surgical History:  Procedure Laterality Date   ACHILLES TENDON REPAIR Right 11/2009   ruptured   ANTERIOR CERVICAL DECOMP/DISCECTOMY FUSION  04/23/2009   C7 -- T1   CARPAL TUNNEL RELEASE Bilateral    2020 (4 months apart)   COLONOSCOPY WITH PROPOFOL N/A 12/11/2014   Procedure: COLONOSCOPY WITH PROPOFOL;  Surgeon: Charolett Bumpers, MD;  Location: [facility];  Service: Endoscopy;  Laterality: N/A;   FINGER ARTHRODESIS Right 02/06/2017   Procedure: ARTHRODESIS RIGHT INDEX FINGER DISTAL INTERPHALANGEAL JOINT;  Surgeon: Betha Loa, MD;  Location: [facility];  Service: Orthopedics;  Laterality: Right;   INGUINAL HERNIA REPAIR Bilateral left 03/18/2002/  right -- yrs ago   PENILE PROSTHESIS IMPLANT N/A 04/03/2016   Procedure: PENILE PROTHESIS INFLATABLE;  Surgeon: Marcine Matar, MD;  Location: [facility];  Service: Urology;  Laterality: N/A;   REVERSE SHOULDER ARTHROPLASTY Left 08/01/2022   Procedure: REVERSE SHOULDER ARTHROPLASTY;  Surgeon: Yolonda Kida, MD;  Location: [facility];  Service: Orthopedics;  Laterality: Left;  115   SHOULDER ARTHROSCOPY W/ ROTATOR CUFF  REPAIR Right 2002 and 2012   SHOULDER ARTHROSCOPY WITH ROTATOR CUFF REPAIR AND SUBACROMIAL DECOMPRESSION Right 05/06/2012   Procedure: SHOULDER ARTHROSCOPY WITH ROTATOR CUFF REPAIR AND SUBACROMIAL DECOMPRESSION;  Surgeon: Loreta Ave, MD;  Location: [facility];  Service: Orthopedics;  Laterality: Right;  RIGHT SHOULDER ARTHROSCOPY  lysis of adhesions rotator cuff repair    SHOULDER ARTHROSCOPY/  DEBRIDEMENT LABRUM AND ROTATOR CUFF PARTIAL TEAR/  RESECTION BURSA/  ACROMIOPLASTY/  CORACOACROMINAL LIGAMENT RELEASE/  DCR Left 12/17/2001   SOFT TISSUE MASS EXCISION  06/25/2006   right forearm-- angiolipoma   TONSILLECTOMY AND ADENOIDECTOMY  child   TOTAL SHOULDER ARTHROPLASTY Right 02/04/2016   ULNAR TUNNEL RELEASE Left    11/2021    SOCIAL HISTORY: Social History   Socioeconomic History   Marital status: Married    Spouse name: Not on file   Number of children: Not on file   Years of education: Not on file   Highest education level: Not on file  Occupational History   Not on file  Tobacco Use   Smoking status: Never   Smokeless tobacco: Never  Vaping Use   Vaping status: Never Used  Substance and Sexual Activity   Alcohol use: Yes    Alcohol/week: 14.0 standard drinks of alcohol    Types: 14 Glasses of wine per week    Comment: daily wine, 2-3 per day, no h/o w/d   Drug use: No   Sexual activity: Not on file  Other Topics Concern   Not on file  Social History Narrative   Caffiene 1 cup daily coffee daily   Working: retired.    Social Determinants of Health   Financial Resource Strain: Not on file  Food Insecurity: No Food Insecurity (08/01/2022)   Hunger Vital Sign    Worried About Running Out of Food in the Last Year: Never true    Ran Out of Food in the Last Year: Never true  Transportation Needs: No Transportation Needs (08/01/2022)   PRAPARE - Administrator, Civil Service (Medical): No    Lack of Transportation (Non-Medical): No   Physical Activity: Inactive (04/02/2022)   Received from [facility], [facility]   Exercise Vital Sign    Days of Exercise per Week: 0 days    Minutes of Exercise per Session: 0 min  Stress: Not on file  Social Connections: Unknown (05/07/2022)   Received from [facility], [facility]   Social Network    Social Network: Not on file  Intimate Partner Violence: Not At Risk (08/01/2022)   Humiliation, Afraid, Rape, and Kick questionnaire    Fear of Current or Ex-Partner: No    Emotionally Abused: No    Physically Abused: No    Sexually Abused: No    FAMILY HISTORY Family History  Problem Relation Age of Onset   Parkinsonism Father    Cancer Sister    Colon cancer Neg Hx    Esophageal cancer Neg Hx    Rectal cancer Neg Hx    Stomach cancer Neg Hx     ALLERGIES:  is allergic to vancomycin, hydrocodone, penicillins, and percocet [oxycodone-acetaminophen].  MEDICATIONS:  Current Outpatient Medications  Medication Sig Dispense Refill   amLODipine (NORVASC) 10 MG tablet Take 5 mg by mouth daily in the afternoon.     anastrozole (ARIMIDEX) 1 MG tablet Take 1 mg by mouth daily.     apixaban (ELIQUIS) 5 MG TABS tablet Take 1 tablet (5 mg total) by mouth 2 (two) times daily. 90 tablet 7   atorvastatin (LIPITOR) 20 MG tablet Take 20 mg by mouth every morning.      baclofen (LIORESAL) 10 MG tablet Take 1 tablet by mouth 3 (three) times daily as needed.     buPROPion (WELLBUTRIN XL) 300 MG 24 hr tablet Take 300 mg by mouth every morning.      furosemide (LASIX) 20 MG tablet Take 1 tablet (20 mg total) by mouth daily as needed. Only Take AS NEEDED. (Patient taking differently: Take 20 mg by mouth daily as needed for fluid. Only Take AS NEEDED.) 90 tablet 3   irbesartan (AVAPRO) 300 MG tablet Take 300 mg by mouth every morning.      MAGNESIUM MALATE PO Take 1,350 mg by mouth in the morning.     melatonin 1 MG TABS tablet Take 13 mg by mouth at bedtime as needed (as needed).     Omega-3  Fatty Acids (FISH OIL PO) Take 200 mg by mouth daily.     omeprazole (PRILOSEC) 40 MG  capsule Take 40 mg by mouth every morning.      tamsulosin (FLOMAX) 0.4 MG CAPS capsule Take 0.4 mg by mouth daily.     TESTOSTERONE IM Inject 0.4 mLs into the muscle See admin instructions. Every 10 days     TRINTELLIX 10 MG TABS tablet Take 10 mg by mouth daily.     valACYclovir (VALTREX) 500 MG tablet Take 500 mg by mouth as needed (for flares).     No current facility-administered medications for this visit.    PHYSICAL EXAMINATION:  ECOG PERFORMANCE STATUS: 0 - Asymptomatic   Vitals:   02/16/23 1336  BP: 112/80  Pulse: 98  Resp: 16  Temp: 98.2 F (36.8 C)  SpO2: 93%     Filed Weights   02/16/23 1336  Weight: 175 lb 6.4 oz (79.6 kg)      Physical Exam  As part of a telephone visit a physical exam was not conducted  LABORATORY DATA: I have personally reviewed the data as listed:  No visits with results within 1 Month(s) from this visit.  Latest known visit with results is:  Appointment on 11/14/2022  Component Date Value Ref Range Status   D-Dimer, Quant 11/14/2022 3.19 (H)  0.00 - 0.50 ug/mL-FEU Final   Comment: (NOTE) At the manufacturer cut-off value of 0.5 g/mL FEU, this assay has a negative predictive value of 95-100%.This assay is intended for use in conjunction with a clinical pretest probability (PTP) assessment model to exclude pulmonary embolism (PE) and deep venous thrombosis (DVT) in outpatients suspected of PE or DVT. Results should be correlated with clinical presentation. Performed at [facility], [address] [place], [place] [address]    Sodium 11/14/2022 139  135 - 145 mmol/L Final   Potassium 11/14/2022 4.0  3.5 - 5.1 mmol/L Final   Chloride 11/14/2022 105  98 - 111 mmol/L Final   CO2 11/14/2022 24  22 - 32 mmol/L Final   Glucose, Bld 11/14/2022 84  70 - 99 mg/dL Final   Glucose reference range applies only to samples taken  after fasting for at least 8 hours.   BUN 11/14/2022 17  8 - 23 mg/dL Final   Creatinine 21/30/8657 1.08  0.61 - 1.24 mg/dL Final   Calcium 84/69/6295 9.6  8.9 - 10.3 mg/dL Final   Total Protein 28/41/3244 7.0  6.5 - 8.1 g/dL Final   Albumin 03/12/7251 4.5  3.5 - 5.0 g/dL Final   AST 66/44/0347 26  15 - 41 U/L Final   ALT 11/14/2022 21  0 - 44 U/L Final   Alkaline Phosphatase 11/14/2022 59  38 - 126 U/L Final   Total Bilirubin 11/14/2022 0.6  0.3 - 1.2 mg/dL Final   GFR, Estimated 11/14/2022 >60  >60 mL/min Final   Comment: (NOTE) Calculated using the CKD-EPI Creatinine Equation (2021)    Anion gap 11/14/2022 10  5 - 15 Final   Performed at [facility], [address] [place], [place] [address]   WBC Count 11/14/2022 7.1  4.0 - 10.5 K/uL Final   RBC 11/14/2022 4.89  4.22 - 5.81 MIL/uL Final   Hemoglobin 11/14/2022 15.1  13.0 - 17.0 g/dL Final   HCT 63/87/5643 43.3  39.0 - 52.0 % Final   MCV 11/14/2022 88.5  80.0 - 100.0 fL Final   MCH 11/14/2022 30.9  26.0 - 34.0 pg Final   MCHC 11/14/2022 34.9  30.0 - 36.0 g/dL Final   RDW 32/95/1884 13.9  11.5 -  15.5 % Final   Platelet Count 11/14/2022 206  150 - 400 K/uL Final   nRBC 11/14/2022 0.0  0.0 - 0.2 % Final   Neutrophils Relative % 11/14/2022 72  % Final   Neutro Abs 11/14/2022 5.2  1.7 - 7.7 K/uL Final   Lymphocytes Relative 11/14/2022 14  % Final   Lymphs Abs 11/14/2022 1.0  0.7 - 4.0 K/uL Final   Monocytes Relative 11/14/2022 10  % Final   Monocytes Absolute 11/14/2022 0.7  0.1 - 1.0 K/uL Final   Eosinophils Relative 11/14/2022 3  % Final   Eosinophils Absolute 11/14/2022 0.2  0.0 - 0.5 K/uL Final   Basophils Relative 11/14/2022 1  % Final   Basophils Absolute 11/14/2022 0.1  0.0 - 0.1 K/uL Final   Immature Granulocytes 11/14/2022 0  % Final   Abs Immature Granulocytes 11/14/2022 0.01  0.00 - 0.07 K/uL Final   Performed at [facility], [address] [place], [place] [address]     RADIOGRAPHIC STUDIES: I have personally reviewed the radiological images as listed and agree with the findings in the report  No results found.  ASSESSMENT/PLAN 72 y.o. male is here because of VTE.   Medical history notable for depression, DJD, ED, GERD, hemorrhoids, colon polyps (adenomatous and hyperplastic) hyperlipidemia, hypertension  Pulmonary embolism  December 05 2022- Presented with a fib, SOB.  CT PA showed saddle embolism with bilateral pulmonary emboli.  No right heart strain  RLE U/S showed occlusive proximal DVT.  Treated with IV heparin followed by Eliquis  VTE risk factors  June 06 2022- Doubtful that the elbow surgery in early September 2023 constituted a risk factor  The most obvious risk factor is testosterone replacement therapy  Hypercoagulable state evaluation demonstrated patient is heterozygous for the FV Leiden gene  June 24 2022- History concerning for possible OSA.    November 06 2022- On the basis of sleep study results it was recommended for patient to start on Autopap  November 13 2024- Several family members have been tested for FV Leiden.  Discussed implications of testing asymptomatic patients and adolescents for the disorder      Therapeutics:  June 06 2022: Continue full dose anticoagulation at this time with consideration given to downward titration of testosterone to lowest effective dose June 24 2022- Continue full dose anticoagulation.  Recommend that testosterone dosing be decreased to that which is minimally effective and does not cause polycythemia.  If found to have OSA that should be treated.   Aug 08 2022- Remains on Eliquis 5 mb bid.  Testosterone dose had been diminished.  D dimer 2.03 but this may be the product of recent surgery.   November 13 2022- Remains on Eliquis 5 mg bid.  D dimer 3.29, Cr 1.08.  Continue Eliquis at current dose until such time that D dimer normalizes    Cancer Staging  No matching staging information was  found for the patient.    No problem-specific Assessment & Plan notes found for this encounter.    No orders of the defined types were placed in this encounter.   30  minutes was spent in patient care.  This included time spent preparing to see the patient (e.g., review of tests), obtaining and/or reviewing separately obtained history, counseling and educating the patient/family/caregiver, ordering medications, tests, or procedures; documenting clinical information in the electronic or other health record, independently interpreting results and communicating results to the patient/family/caregiver as well as coordination of care.  All questions were answered. The patient knows to call the clinic with any problems, questions or concerns.  This note was electronically signed.    Loni Muse, MD  02/16/2023 1:40 PM

## 2023-02-27 DIAGNOSIS — H353221 Exudative age-related macular degeneration, left eye, with active choroidal neovascularization: Secondary | ICD-10-CM | POA: Diagnosis not present

## 2023-02-27 DIAGNOSIS — H4312 Vitreous hemorrhage, left eye: Secondary | ICD-10-CM | POA: Diagnosis not present

## 2023-02-27 DIAGNOSIS — H35413 Lattice degeneration of retina, bilateral: Secondary | ICD-10-CM | POA: Diagnosis not present

## 2023-03-16 DIAGNOSIS — Z7901 Long term (current) use of anticoagulants: Secondary | ICD-10-CM | POA: Insufficient documentation

## 2023-03-17 DIAGNOSIS — M5416 Radiculopathy, lumbar region: Secondary | ICD-10-CM | POA: Diagnosis not present

## 2023-04-06 DIAGNOSIS — H43392 Other vitreous opacities, left eye: Secondary | ICD-10-CM | POA: Diagnosis not present

## 2023-04-06 DIAGNOSIS — Z961 Presence of intraocular lens: Secondary | ICD-10-CM | POA: Diagnosis not present

## 2023-04-06 DIAGNOSIS — H40013 Open angle with borderline findings, low risk, bilateral: Secondary | ICD-10-CM | POA: Diagnosis not present

## 2023-04-06 DIAGNOSIS — H353221 Exudative age-related macular degeneration, left eye, with active choroidal neovascularization: Secondary | ICD-10-CM | POA: Diagnosis not present

## 2023-04-28 DIAGNOSIS — H353221 Exudative age-related macular degeneration, left eye, with active choroidal neovascularization: Secondary | ICD-10-CM | POA: Diagnosis not present

## 2023-06-15 ENCOUNTER — Other Ambulatory Visit: Payer: Self-pay | Admitting: Internal Medicine

## 2023-06-15 DIAGNOSIS — H40013 Open angle with borderline findings, low risk, bilateral: Secondary | ICD-10-CM | POA: Diagnosis not present

## 2023-06-15 DIAGNOSIS — H43392 Other vitreous opacities, left eye: Secondary | ICD-10-CM | POA: Diagnosis not present

## 2023-06-15 DIAGNOSIS — Z961 Presence of intraocular lens: Secondary | ICD-10-CM | POA: Diagnosis not present

## 2023-06-15 DIAGNOSIS — I491 Atrial premature depolarization: Secondary | ICD-10-CM

## 2023-06-15 DIAGNOSIS — H353221 Exudative age-related macular degeneration, left eye, with active choroidal neovascularization: Secondary | ICD-10-CM | POA: Diagnosis not present

## 2023-06-15 DIAGNOSIS — I482 Chronic atrial fibrillation, unspecified: Secondary | ICD-10-CM

## 2023-06-15 DIAGNOSIS — H26491 Other secondary cataract, right eye: Secondary | ICD-10-CM | POA: Diagnosis not present

## 2023-06-15 NOTE — Telephone Encounter (Signed)
 Prescription refill request for Eliquis received. Indication:pac Last office visit:12/24 Scr:1.03  12/24 Age: 73 Weight:79.6  kg  Prescription refilled

## 2023-06-16 DIAGNOSIS — H353221 Exudative age-related macular degeneration, left eye, with active choroidal neovascularization: Secondary | ICD-10-CM | POA: Diagnosis not present

## 2023-06-16 NOTE — Progress Notes (Unsigned)
 [facility]  [address] [place],  [place]  [address] [phone]  Clinic Day:  06/17/2023  Referring physician: Cleatis Polka., MD   HISTORY OF PRESENT ILLNESS:  The patient is a 73 y.o. male with a history of a saddle pulmonary embolism in September 2023.  Doppler ultrasound studies also showed a DVT involving his right common femoral vein and popliteal vein. He currently takes Eliquis 5 mg BID.  He comes in today for routine follow-up.  Since his last visit, the patient has been doing well.  He denies having any new symptoms or findings which concern him for a recurrent clot being present. Of note, he tested heterozygous for the factor V Leiden mutation.  He also has a long history of testosterone use, including at the time his clots were found.  He claims his testosterone dose is now 3 mL weekly, which is much lower than what he was previously taking.  The patient has gotten phlebotomies in the past whenever his hemoglobin rose to elevated levels.  However, he claims he has not had a phlebotomy in over 3-4 months.  PHYSICAL EXAM:  Blood pressure 119/76, pulse 86, temperature 98.4 F (36.9 C), temperature source Oral, resp. rate 20, height 5\' 7"  (1.702 m), weight 179 lb 1.6 oz (81.2 kg), SpO2 91%. Wt Readings from Last 3 Encounters:  06/17/23 179 lb 1.6 oz (81.2 kg)  02/16/23 175 lb 6.4 oz (79.6 kg)  02/10/23 175 lb (79.4 kg)   Body mass index is 28.05 kg/m. Performance status (ECOG): 0 - Asymptomatic Physical Exam Constitutional:      Appearance: Normal appearance. He is not ill-appearing.  HENT:     Mouth/Throat:     Mouth: Mucous membranes are moist.     Pharynx: Oropharynx is clear. No oropharyngeal exudate or posterior oropharyngeal erythema.  Cardiovascular:     Rate and Rhythm: Normal rate and regular rhythm.     Heart sounds: No murmur heard.    No friction rub. No gallop.  Pulmonary:     Effort: Pulmonary effort is normal. No  respiratory distress.     Breath sounds: Normal breath sounds. No wheezing, rhonchi or rales.  Abdominal:     General: Bowel sounds are normal. There is no distension.     Palpations: Abdomen is soft. There is no mass.     Tenderness: There is no abdominal tenderness.  Musculoskeletal:        General: No swelling.     Right lower leg: No edema.     Left lower leg: No edema.  Lymphadenopathy:     Cervical: No cervical adenopathy.     Upper Body:     Right upper body: No supraclavicular or axillary adenopathy.     Left upper body: No supraclavicular or axillary adenopathy.     Lower Body: No right inguinal adenopathy. No left inguinal adenopathy.  Skin:    General: Skin is warm.     Coloration: Skin is not jaundiced.     Findings: No lesion or rash.  Neurological:     General: No focal deficit present.     Mental Status: He is alert and oriented to person, place, and time. Mental status is at baseline.  Psychiatric:        Mood and Affect: Mood normal.        Behavior: Behavior normal.        Thought Content: Thought content normal.    LABS:  Latest Ref Rng & Units 06/17/2023   10:06 AM 02/16/2023    2:10 PM 11/14/2022   10:03 AM  CBC  WBC 4.0 - 10.5 K/uL 4.1  5.2  7.1   Hemoglobin 13.0 - 17.0 g/dL 02.6  37.8  58.8   Hematocrit 39.0 - 52.0 % 48.0  45.6  43.3   Platelets 150 - 400 K/uL 247  216  206       Latest Ref Rng & Units 02/16/2023    2:10 PM 11/14/2022   10:03 AM 08/02/2022    3:34 AM  CMP  Glucose 70 - 99 mg/dL 502  84  774   BUN 8 - 23 mg/dL 14  17  17    Creatinine 0.61 - 1.24 mg/dL 1.28  7.86  7.67   Sodium 135 - 145 mmol/L 140  139  138   Potassium 3.5 - 5.1 mmol/L 3.9  4.0  3.8   Chloride 98 - 111 mmol/L 106  105  104   CO2 22 - 32 mmol/L 22  24  25    Calcium 8.9 - 10.3 mg/dL 9.8  9.6  8.6   Total Protein 6.5 - 8.1 g/dL 6.4  7.0    Total Bilirubin <1.2 mg/dL 0.5  0.6    Alkaline Phos 38 - 126 U/L 48  59    AST 15 - 41 U/L 35  26    ALT 0 - 44 U/L 24   21      ASSESSMENT & PLAN:  Assessment/Plan:  A 73 y.o. male with a history of bilateral pulmonary emboli and extensive DVT in his right leg, which were found in September 2023.  As mentioned previously, he has been taking testosterone for numerous years, including at the time these clots were found.  He also has a factor V Leiden mutation.  As the patient is uninterested in stopping his testosterone therapy, I do believe he needs to stay on his Eliquis therapy indefinitely to prevent additional clots from developing.  With respect to his history of testosterone-induced polycythemia, my recommendation is for his primary care office to check his CBC once every 4 months to ensure his testosterone is not leading to a significant rise in his hemoglobin.  If his hemoglobin rises to/above 18, then I would recommend a phlebotomy.  Overall, the patient appears to be doing very well.  As that is the case, I will see him back in 1 year for repeat clinical assessment.  The patient understands all the plans discussed today and is in agreement with them.  Nalu Troublefield Kirby Funk, MD

## 2023-06-16 NOTE — Progress Notes (Unsigned)
 [facility] Cancer Follow up Visit:  Patient Care Team: Cleatis Polka., MD as PCP - General (Internal Medicine) Little Ishikawa, MD as PCP - Cardiology (Cardiology)  CHIEF COMPLAINTS/PURPOSE OF CONSULTATION: HISTORY OF PRESENTING ILLNESS: Jacob [name] 73 y.o. male is here because of VTE Medical history notable for depression, DJD, ED, GERD, hemorrhoids, colon polyps (adenomatous and hyperplastic) hyperlipidemia, hypertension  December 04, 2021: Presented to emergency room with shortness of breath and dyspnea on exertion as well as right ankle edema which had worsened over the previous day  History was notable for an episode of atrial fibrillation noted on his Apple Watch for which she was placed on Eliquis.  This has been stopped by cardiology after 4 to 5 days because a new strip showed NSR with frequent PACs.  On November 29, 2020.  Contacted cardiology because of intermittent lightheadedness and shortness of breath He had had elbow surgery 2 weeks prior  CTPA-saddle embolism with bilateral pulmonary emboli, largest burden in right lower lobe.  No evidence of right heart strain. CT abdomen pelvis showed no acute findings  Right lower extremity ultrasound showed occlusive DVT involving right common femoral vein, femoral and popliteal veins  Labs on admission showed a normal BMP. WBC 6.3 hemoglobin 16.7 platelet count 151; 70 segs 16 lymphs 12 monos 1 EO 1 basophil. INR 1.1  He was treated with IV heparin and transition to Eliquis.  June 06 2022:  [facility] Hematology Consult Patient had been on Testosterone injections for years prior to the incident.  His urologist has elected to continue it.    Social:  Married.  Retired owned Research scientist (medical).  Tobacco none.  EtOH 2 drinks per day  [facility] Mother died 21 possibly from VTE Father died 7 Parkinson's disease Sister alive 37 melanoma  Sister alive 78 obesity  WBC 3.5 hemoglobin 17.5 platelet  count 212; 50 segs 31 lymphs 16 monos 1 EO 1 basophil Flow for PNH negative Patient heterozygous for factor V Leiden gene mutation prothrombin gene mutation not detected D-dimer 0.67 fibrinogen 336 INR 1.1 PTT 31 Anticardiolipin antibody and antibeta 2 glycoprotein negative. ANA panel negative.  Rheumatoid factor negative.  PSA 2.8 Testosterone 820 CMP normal  June 24 2022: Scheduled follow-up regarding VTE.  Reviewed results of labs with patient and his wife.   Telephone visit.  Patient states that three years ago he was noted to have an elevated Hgb level which has been treated with blood donation.  The use of testosterone has well preceded this.  Patient snores loudly and is to go for a sleep study later this month.    Recommend that he decrease testosterone dosing Agree with sleep study because OSA is a risk factor for polycythemia and VTE (Sleep study to be done by [facility]) Continue full dose anticoagulation  Aug 01 2022:  Reverse left total shoulder arthroplasty  Aug 08 2022:    Patient states that shoulder surgery went well.  He is also s/p Mohs surgery to left mandibular area for squamous cell carcinoma.  Has not yet undergone sleep study and will not likely be done until August at least. Cut testosterone back to 3/10ths of a cc q 8 days and can tell the difference but it is OK.  Remains on Eliquis 5 mg bid.   His three adult children are going to be tested for FV Leiden.   Hgb 14.2 PLT 263  D-dimer 2.03 (likely related to recent surgery)  November 14, 2022:  Several children and grandchildren have been tested for FV Leiden and were found to be positive.  Discussed the implications of testing adolescents and asymptomatic patients for FV Ledeiden.  Has healed well from shoulder arthroplasty.  Patient states that clear margins were obtained from the Mohs surgery.  Remains on testosterone replacement.  Remains on Eliquis 5 mg bid.  No bleeding issues.   D dimer 3.19 Cr  1.08 If D dimer normalizes consider decrease of Eliquis dose to 2.5 mg bid.  No procedures planned at this time.    February 16 2023:  Scheduled follow-up regarding VTE.  Remains on Eliquis 5 mg bid.  No bleeding issues.  Remains on testosterone replacement Cr 1.03 D dimer 0.42   Review of Systems - Oncology  MEDICAL HISTORY: Past Medical History:  Diagnosis Date  . Depression   . DJD (degenerative joint disease)   . DVT (deep venous thrombosis) (HCC) 12/04/2021   PE/ Facort V Leiden  . ED (erectile dysfunction) of organic origin   . Factor V Leiden (HCC)   . GERD (gastroesophageal reflux disease)   . Hemorrhoids   . History of adenomatous polyp of colon    12-11-2014  tubular adenoam, hyperplastic  . Hyperlipidemia   . Hypertension   . Macular edema    Receiving injections  . Pulmonary embolism (HCC)   . Skin cancer     SURGICAL HISTORY: Past Surgical History:  Procedure Laterality Date  . ACHILLES TENDON REPAIR Right 11/2009   ruptured  . ANTERIOR CERVICAL DECOMP/DISCECTOMY FUSION  04/23/2009   C7 -- T1  . CARPAL TUNNEL RELEASE Bilateral    2020 (4 months apart)  . COLONOSCOPY WITH PROPOFOL N/A 12/11/2014   Procedure: COLONOSCOPY WITH PROPOFOL;  Surgeon: Charolett Bumpers, MD;  Location: [facility];  Service: Endoscopy;  Laterality: N/A;  . FINGER ARTHRODESIS Right 02/06/2017   Procedure: ARTHRODESIS RIGHT INDEX FINGER DISTAL INTERPHALANGEAL JOINT;  Surgeon: Betha Loa, MD;  Location: [facility];  Service: Orthopedics;  Laterality: Right;  . INGUINAL HERNIA REPAIR Bilateral left 03/18/2002/  right -- yrs ago  . PENILE PROSTHESIS IMPLANT N/A 04/03/2016   Procedure: PENILE PROTHESIS INFLATABLE;  Surgeon: Marcine Matar, MD;  Location: [facility];  Service: Urology;  Laterality: N/A;  . REVERSE SHOULDER ARTHROPLASTY Left 08/01/2022   Procedure: REVERSE SHOULDER ARTHROPLASTY;  Surgeon: Yolonda Kida, MD;  Location: [facility];   Service: Orthopedics;  Laterality: Left;  115  . SHOULDER ARTHROSCOPY W/ ROTATOR CUFF REPAIR Right 2002 and 2012  . SHOULDER ARTHROSCOPY WITH ROTATOR CUFF REPAIR AND SUBACROMIAL DECOMPRESSION Right 05/06/2012   Procedure: SHOULDER ARTHROSCOPY WITH ROTATOR CUFF REPAIR AND SUBACROMIAL DECOMPRESSION;  Surgeon: Loreta Ave, MD;  Location: [facility];  Service: Orthopedics;  Laterality: Right;  RIGHT SHOULDER ARTHROSCOPY  lysis of adhesions rotator cuff repair   . SHOULDER ARTHROSCOPY/  DEBRIDEMENT LABRUM AND ROTATOR CUFF PARTIAL TEAR/  RESECTION BURSA/  ACROMIOPLASTY/  CORACOACROMINAL LIGAMENT RELEASE/  DCR Left 12/17/2001  . SOFT TISSUE MASS EXCISION  06/25/2006   right forearm-- angiolipoma  . TONSILLECTOMY AND ADENOIDECTOMY  child  . TOTAL SHOULDER ARTHROPLASTY Right 02/04/2016  . ULNAR TUNNEL RELEASE Left    11/2021    SOCIAL HISTORY: Social History   Socioeconomic History  . Marital status: Married    Spouse name: Not on file  . Number of children: Not on file  . Years of education: Not on file  . Highest education level: Not on  file  Occupational History  . Not on file  Tobacco Use  . Smoking status: Never  . Smokeless tobacco: Never  Vaping Use  . Vaping status: Never Used  Substance and Sexual Activity  . Alcohol use: Yes    Alcohol/week: 14.0 standard drinks of alcohol    Types: 14 Glasses of wine per week    Comment: daily wine, 2-3 per day, no h/o w/d  . Drug use: No  . Sexual activity: Not on file  Other Topics Concern  . Not on file  Social History Narrative   Caffiene 1 cup daily coffee daily   Working: retired.    Social Drivers of Corporate investment banker Strain: Not on file  Food Insecurity: No Food Insecurity (08/01/2022)   Hunger Vital Sign   . Worried About Programme researcher, broadcasting/film/video in the Last Year: Never true   . Ran Out of Food in the Last Year: Never true  Transportation Needs: No Transportation Needs (08/01/2022)   PRAPARE -  Transportation   . Lack of Transportation (Medical): No   . Lack of Transportation (Non-Medical): No  Physical Activity: Inactive (04/02/2022)   Received from [facility]   Exercise Vital Sign   . Days of Exercise per Week: 0 days   . Minutes of Exercise per Session: 0 min  Stress: Not on file  Social Connections: Unknown (05/07/2022)   [facility] from [facility]   Social Network   . Social Network: Not on file  Intimate Partner Violence: Not At Risk (08/01/2022)   Humiliation, Afraid, Rape, and Kick questionnaire   . Fear of Current or Ex-Partner: No   . Emotionally Abused: No   . Physically Abused: No   . Sexually Abused: No    FAMILY HISTORY Family History  Problem Relation Age of Onset  . Parkinsonism Father   . Cancer Sister   . Colon cancer Neg Hx   . Esophageal cancer Neg Hx   . Rectal cancer Neg Hx   . Stomach cancer Neg Hx     ALLERGIES:  is allergic to vancomycin, hydrocodone, penicillins, and percocet [oxycodone-acetaminophen].  MEDICATIONS:  Current Outpatient Medications  Medication Sig Dispense Refill  . amLODipine (NORVASC) 10 MG tablet Take 5 mg by mouth daily in the afternoon.    Marland Kitchen anastrozole (ARIMIDEX) 1 MG tablet Take 1 mg by mouth daily.    Marland Kitchen apixaban (ELIQUIS) 5 MG TABS tablet TAKE ONE TABLET BY MOUTH TWICE DAILY 90 tablet 1  . atorvastatin (LIPITOR) 20 MG tablet Take 20 mg by mouth every morning.     . baclofen (LIORESAL) 10 MG tablet Take 1 tablet by mouth 3 (three) times daily as needed.    Marland Kitchen buPROPion (WELLBUTRIN XL) 300 MG 24 hr tablet Take 300 mg by mouth every morning.     . furosemide (LASIX) 20 MG tablet Take 1 tablet (20 mg total) by mouth daily as needed. Only Take AS NEEDED. (Patient taking differently: Take 20 mg by mouth daily as needed for fluid. Only Take AS NEEDED.) 90 tablet 3  . irbesartan (AVAPRO) 300 MG tablet Take 300 mg by mouth every morning.     Marland Kitchen MAGNESIUM MALATE PO Take 1,350 mg by mouth in the  morning.    . melatonin 1 MG TABS tablet Take 13 mg by mouth at bedtime as needed (as needed).    . Omega-3 Fatty Acids (FISH OIL PO) Take 200 mg by mouth daily.    Marland Kitchen  omeprazole (PRILOSEC) 40 MG capsule Take 40 mg by mouth every morning.     . tamsulosin (FLOMAX) 0.4 MG CAPS capsule Take 0.4 mg by mouth daily.    . TESTOSTERONE IM Inject 0.4 mLs into the muscle See admin instructions. Every 10 days    . TRINTELLIX 10 MG TABS tablet Take 10 mg by mouth daily.    . valACYclovir (VALTREX) 500 MG tablet Take 500 mg by mouth as needed (for flares).     No current facility-administered medications for this visit.    PHYSICAL EXAMINATION:  ECOG PERFORMANCE STATUS: 0 - Asymptomatic   There were no vitals filed for this visit.    There were no vitals filed for this visit.     Physical Exam  As part of a telephone visit a physical exam was not conducted  LABORATORY DATA: I have personally reviewed the data as listed:  No visits with results within 1 Month(s) from this visit.  Latest known visit with results is:  Appointment on 02/16/2023  Component Date Value Ref Range Status  . D-Dimer, Quant 02/16/2023 0.42  0.00 - 0.50 ug/mL-FEU Final   Comment: (NOTE) At the manufacturer cut-off value of 0.5 g/mL FEU, this assay has a negative predictive value of 95-100%.This assay is intended for use in conjunction with a clinical pretest probability (PTP) assessment model to exclude pulmonary embolism (PE) and deep venous thrombosis (DVT) in outpatients suspected of PE or DVT. Results should be correlated with clinical presentation. Performed at [facility], [address], [place], [place] [address]   . Sodium 02/16/2023 140  135 - 145 mmol/L Final  . Potassium 02/16/2023 3.9  3.5 - 5.1 mmol/L Final  . Chloride 02/16/2023 106  98 - 111 mmol/L Final  . CO2 02/16/2023 22  22 - 32 mmol/L Final  . Glucose, Bld 02/16/2023 137 (H)  70 - 99 mg/dL Final   Glucose  reference range applies only to samples taken after fasting for at least 8 hours.  . BUN 02/16/2023 14  8 - 23 mg/dL Final  . Creatinine, Ser 02/16/2023 1.03  0.61 - 1.24 mg/dL Final  . Calcium 91/47/8295 9.8  8.9 - 10.3 mg/dL Final  . Total Protein 02/16/2023 6.4 (L)  6.5 - 8.1 g/dL Final  . Albumin 62/13/0865 4.2  3.5 - 5.0 g/dL Final  . AST 78/46/9629 35  15 - 41 U/L Final  . ALT 02/16/2023 24  0 - 44 U/L Final  . Alkaline Phosphatase 02/16/2023 48  38 - 126 U/L Final  . Total Bilirubin 02/16/2023 0.5  <1.2 mg/dL Final  . GFR, Estimated 02/16/2023 >60  >60 mL/min Final   Comment: (NOTE) Calculated using the CKD-EPI Creatinine Equation (2021)   . Anion gap 02/16/2023 12  5 - 15 Final   Performed at [facility], [address] [place], [place], [address]  . WBC 02/16/2023 5.2  4.0 - 10.5 K/uL Final  . RBC 02/16/2023 4.97  4.22 - 5.81 MIL/uL Final  . Hemoglobin 02/16/2023 16.2  13.0 - 17.0 g/dL Final  . HCT 32/44/0102 45.6  39.0 - 52.0 % Final  . MCV 02/16/2023 91.8  80.0 - 100.0 fL Final  . MCH 02/16/2023 32.6  26.0 - 34.0 pg Final  . MCHC 02/16/2023 35.5  30.0 - 36.0 g/dL Final  . RDW 72/53/6644 14.0  11.5 - 15.5 % Final  . Platelets 02/16/2023 216  150 - 400 K/uL Final  . nRBC 02/16/2023  0.00  0.0 - 0.2 % Final  . Neutrophils Relative % 02/16/2023 60  % Final  . Neutro Abs 02/16/2023 3.2  1.7 - 7.7 K/uL Final  . Lymphocytes Relative 02/16/2023 26  % Final  . Lymphs Abs 02/16/2023 1.3  0.7 - 4.0 K/uL Final  . Monocytes Relative 02/16/2023 12  % Final  . Monocytes Absolute 02/16/2023 0.6  0.1 - 1.0 K/uL Final  . Eosinophils Relative 02/16/2023 1  % Final  . Eosinophils Absolute 02/16/2023 0.0  0.0 - 0.5 K/uL Final  . Basophils Relative 02/16/2023 1  % Final  . Basophils Absolute 02/16/2023 0.0  0.0 - 0.1 K/uL Final  . Immature Granulocytes 02/16/2023 0  % Final  . Abs Immature Granulocytes 02/16/2023 0.01  0.00 - 0.07 K/uL Final  . nRBC 02/16/2023 0   0 /100 WBC Final   Performed at [facility], [address], [place], [place], [address]    RADIOGRAPHIC STUDIES: I have personally reviewed the radiological images as listed and agree with the findings in the report  No results found.  ASSESSMENT/PLAN 73 y.o. male is here because of VTE.   Medical history notable for depression, DJD, ED, GERD, hemorrhoids, colon polyps (adenomatous and hyperplastic) hyperlipidemia, hypertension  Pulmonary embolism  December 05 2022- Presented with a fib, SOB.  CT PA showed saddle embolism with bilateral pulmonary emboli.  No right heart strain  RLE U/S showed occlusive proximal DVT.  Treated with IV heparin followed by Eliquis  VTE risk factors  June 06 2022- Doubtful that the elbow surgery in early September 2023 constituted a risk factor  The most obvious risk factor is testosterone replacement therapy  Hypercoagulable state evaluation demonstrated patient is heterozygous for the FV Leiden gene  June 24 2022- History concerning for possible OSA.    November 06 2022- On the basis of sleep study results it was recommended for patient to start on Autopap  November 13 2024- Several family members have been tested for FV Leiden.  Discussed implications of testing asymptomatic patients and adolescents for the disorder      Therapeutics:  June 06 2022: Continue full dose anticoagulation at this time with consideration given to downward titration of testosterone to lowest effective dose June 24 2022- Continue full dose anticoagulation.  Recommend that testosterone dosing be decreased to that which is minimally effective and does not cause polycythemia.  If found to have OSA that should be treated.   Aug 08 2022- Remains on Eliquis 5 mb bid.  Testosterone dose had been diminished.  D dimer 2.03 but this may be the product of recent surgery.   November 13 2022- Remains on Eliquis 5 mg bid.  D dimer 3.29, Cr 1.08.  Continue  Eliquis at current dose until such time that D dimer normalizes February 16 2023:  Remains on Eliquis 5 mg bid and testosterone replacement.  Cr 1.03 D dimer 0.42.  Would continue Eliquis at current dose.  If D dimer remains normal at follow up visit would consider decreasing Eliquis to 2.5 mg po bid    Cancer Staging  No matching staging information was found for the patient.    No problem-specific Assessment & Plan notes found for this encounter.    No orders of the defined types were placed in this encounter.   30  minutes was spent in patient care.  This included time spent preparing to see the patient (e.g., review of tests), obtaining and/or reviewing separately obtained  history, counseling and educating the patient/family/caregiver, ordering medications, tests, or procedures; documenting clinical information in the electronic or other health record, independently interpreting results and communicating results to the patient/family/caregiver as well as coordination of care.       All questions were answered. The patient knows to call the clinic with any problems, questions or concerns.  This note was electronically signed.    Weston Settle, MD  06/16/2023 10:18 PM

## 2023-06-17 ENCOUNTER — Ambulatory Visit: Payer: Medicare Other | Admitting: Oncology

## 2023-06-17 ENCOUNTER — Inpatient Hospital Stay (HOSPITAL_BASED_OUTPATIENT_CLINIC_OR_DEPARTMENT_OTHER): Payer: Medicare Other | Admitting: Oncology

## 2023-06-17 ENCOUNTER — Other Ambulatory Visit: Payer: Medicare Other

## 2023-06-17 ENCOUNTER — Other Ambulatory Visit: Payer: Self-pay | Admitting: Oncology

## 2023-06-17 ENCOUNTER — Inpatient Hospital Stay: Payer: Medicare Other | Attending: Oncology

## 2023-06-17 VITALS — BP 119/76 | HR 86 | Temp 98.4°F | Resp 20 | Ht 67.0 in | Wt 179.1 lb

## 2023-06-17 DIAGNOSIS — Z86711 Personal history of pulmonary embolism: Secondary | ICD-10-CM | POA: Insufficient documentation

## 2023-06-17 DIAGNOSIS — I2602 Saddle embolus of pulmonary artery with acute cor pulmonale: Secondary | ICD-10-CM

## 2023-06-17 DIAGNOSIS — D6851 Activated protein C resistance: Secondary | ICD-10-CM | POA: Diagnosis not present

## 2023-06-17 DIAGNOSIS — D6859 Other primary thrombophilia: Secondary | ICD-10-CM

## 2023-06-17 DIAGNOSIS — I82401 Acute embolism and thrombosis of unspecified deep veins of right lower extremity: Secondary | ICD-10-CM

## 2023-06-17 DIAGNOSIS — Z92241 Personal history of systemic steroid therapy: Secondary | ICD-10-CM | POA: Diagnosis not present

## 2023-06-17 LAB — CBC WITH DIFFERENTIAL (CANCER CENTER ONLY)
Abs Immature Granulocytes: 0.01 10*3/uL (ref 0.00–0.07)
Basophils Absolute: 0 10*3/uL (ref 0.0–0.1)
Basophils Relative: 1 %
Eosinophils Absolute: 0.1 10*3/uL (ref 0.0–0.5)
Eosinophils Relative: 2 %
HCT: 48 % (ref 39.0–52.0)
Hemoglobin: 16.4 g/dL (ref 13.0–17.0)
Immature Granulocytes: 0 %
Lymphocytes Relative: 36 %
Lymphs Abs: 1.5 10*3/uL (ref 0.7–4.0)
MCH: 31.8 pg (ref 26.0–34.0)
MCHC: 34.2 g/dL (ref 30.0–36.0)
MCV: 93 fL (ref 80.0–100.0)
Monocytes Absolute: 0.5 10*3/uL (ref 0.1–1.0)
Monocytes Relative: 13 %
Neutro Abs: 2 10*3/uL (ref 1.7–7.7)
Neutrophils Relative %: 48 %
Platelet Count: 247 10*3/uL (ref 150–400)
RBC: 5.16 MIL/uL (ref 4.22–5.81)
RDW: 13.4 % (ref 11.5–15.5)
WBC Count: 4.1 10*3/uL (ref 4.0–10.5)
nRBC: 0 % (ref 0.0–0.2)
nRBC: 0 /100{WBCs}

## 2023-06-18 DIAGNOSIS — L821 Other seborrheic keratosis: Secondary | ICD-10-CM | POA: Diagnosis not present

## 2023-06-18 DIAGNOSIS — L570 Actinic keratosis: Secondary | ICD-10-CM | POA: Diagnosis not present

## 2023-06-18 DIAGNOSIS — L812 Freckles: Secondary | ICD-10-CM | POA: Diagnosis not present

## 2023-06-18 DIAGNOSIS — Z85828 Personal history of other malignant neoplasm of skin: Secondary | ICD-10-CM | POA: Diagnosis not present

## 2023-06-18 DIAGNOSIS — D0472 Carcinoma in situ of skin of left lower limb, including hip: Secondary | ICD-10-CM | POA: Diagnosis not present

## 2023-06-18 DIAGNOSIS — D1801 Hemangioma of skin and subcutaneous tissue: Secondary | ICD-10-CM | POA: Diagnosis not present

## 2023-06-18 DIAGNOSIS — L820 Inflamed seborrheic keratosis: Secondary | ICD-10-CM | POA: Diagnosis not present

## 2023-06-18 DIAGNOSIS — D485 Neoplasm of uncertain behavior of skin: Secondary | ICD-10-CM | POA: Diagnosis not present

## 2023-06-22 DIAGNOSIS — H26491 Other secondary cataract, right eye: Secondary | ICD-10-CM | POA: Diagnosis not present

## 2023-07-01 DIAGNOSIS — H26492 Other secondary cataract, left eye: Secondary | ICD-10-CM | POA: Diagnosis not present

## 2023-07-02 DIAGNOSIS — R361 Hematospermia: Secondary | ICD-10-CM | POA: Diagnosis not present

## 2023-07-02 DIAGNOSIS — E291 Testicular hypofunction: Secondary | ICD-10-CM | POA: Diagnosis not present

## 2023-07-06 DIAGNOSIS — M5416 Radiculopathy, lumbar region: Secondary | ICD-10-CM | POA: Diagnosis not present

## 2023-07-08 DIAGNOSIS — H26492 Other secondary cataract, left eye: Secondary | ICD-10-CM | POA: Diagnosis not present

## 2023-07-13 DIAGNOSIS — E291 Testicular hypofunction: Secondary | ICD-10-CM | POA: Diagnosis not present

## 2023-07-21 DIAGNOSIS — H353221 Exudative age-related macular degeneration, left eye, with active choroidal neovascularization: Secondary | ICD-10-CM | POA: Diagnosis not present

## 2023-07-23 DIAGNOSIS — I10 Essential (primary) hypertension: Secondary | ICD-10-CM | POA: Diagnosis not present

## 2023-07-23 DIAGNOSIS — M5416 Radiculopathy, lumbar region: Secondary | ICD-10-CM | POA: Diagnosis not present

## 2023-07-23 DIAGNOSIS — Z7901 Long term (current) use of anticoagulants: Secondary | ICD-10-CM | POA: Diagnosis not present

## 2023-07-23 DIAGNOSIS — E785 Hyperlipidemia, unspecified: Secondary | ICD-10-CM | POA: Diagnosis not present

## 2023-07-23 DIAGNOSIS — Z79899 Other long term (current) drug therapy: Secondary | ICD-10-CM | POA: Diagnosis not present

## 2023-07-23 DIAGNOSIS — I499 Cardiac arrhythmia, unspecified: Secondary | ICD-10-CM | POA: Diagnosis not present

## 2023-07-23 DIAGNOSIS — Z86711 Personal history of pulmonary embolism: Secondary | ICD-10-CM | POA: Diagnosis not present

## 2023-07-23 DIAGNOSIS — G4733 Obstructive sleep apnea (adult) (pediatric): Secondary | ICD-10-CM | POA: Diagnosis not present

## 2023-07-23 DIAGNOSIS — D6851 Activated protein C resistance: Secondary | ICD-10-CM | POA: Diagnosis not present

## 2023-07-23 DIAGNOSIS — M4722 Other spondylosis with radiculopathy, cervical region: Secondary | ICD-10-CM | POA: Diagnosis not present

## 2023-07-23 DIAGNOSIS — Z7689 Persons encountering health services in other specified circumstances: Secondary | ICD-10-CM | POA: Diagnosis not present

## 2023-07-23 DIAGNOSIS — E291 Testicular hypofunction: Secondary | ICD-10-CM | POA: Diagnosis not present

## 2023-08-24 DIAGNOSIS — I2692 Saddle embolus of pulmonary artery without acute cor pulmonale: Secondary | ICD-10-CM | POA: Diagnosis not present

## 2023-08-24 DIAGNOSIS — M5416 Radiculopathy, lumbar region: Secondary | ICD-10-CM | POA: Diagnosis not present

## 2023-08-24 DIAGNOSIS — R638 Other symptoms and signs concerning food and fluid intake: Secondary | ICD-10-CM | POA: Diagnosis not present

## 2023-08-24 DIAGNOSIS — R066 Hiccough: Secondary | ICD-10-CM | POA: Diagnosis not present

## 2023-08-24 DIAGNOSIS — M4722 Other spondylosis with radiculopathy, cervical region: Secondary | ICD-10-CM | POA: Diagnosis not present

## 2023-08-24 DIAGNOSIS — E785 Hyperlipidemia, unspecified: Secondary | ICD-10-CM | POA: Diagnosis not present

## 2023-08-24 DIAGNOSIS — E559 Vitamin D deficiency, unspecified: Secondary | ICD-10-CM | POA: Diagnosis not present

## 2023-08-24 DIAGNOSIS — D6851 Activated protein C resistance: Secondary | ICD-10-CM | POA: Diagnosis not present

## 2023-08-24 DIAGNOSIS — E291 Testicular hypofunction: Secondary | ICD-10-CM | POA: Diagnosis not present

## 2023-08-24 DIAGNOSIS — Z1331 Encounter for screening for depression: Secondary | ICD-10-CM | POA: Diagnosis not present

## 2023-08-24 DIAGNOSIS — H353120 Nonexudative age-related macular degeneration, left eye, stage unspecified: Secondary | ICD-10-CM | POA: Diagnosis not present

## 2023-08-24 DIAGNOSIS — Z7901 Long term (current) use of anticoagulants: Secondary | ICD-10-CM | POA: Diagnosis not present

## 2023-08-24 DIAGNOSIS — M5412 Radiculopathy, cervical region: Secondary | ICD-10-CM | POA: Diagnosis not present

## 2023-08-24 DIAGNOSIS — Z86711 Personal history of pulmonary embolism: Secondary | ICD-10-CM | POA: Diagnosis not present

## 2023-08-24 DIAGNOSIS — I10 Essential (primary) hypertension: Secondary | ICD-10-CM | POA: Diagnosis not present

## 2023-08-24 DIAGNOSIS — G4733 Obstructive sleep apnea (adult) (pediatric): Secondary | ICD-10-CM | POA: Diagnosis not present

## 2023-08-24 DIAGNOSIS — I499 Cardiac arrhythmia, unspecified: Secondary | ICD-10-CM | POA: Diagnosis not present

## 2023-08-24 DIAGNOSIS — Z7689 Persons encountering health services in other specified circumstances: Secondary | ICD-10-CM | POA: Diagnosis not present

## 2023-08-24 DIAGNOSIS — R6889 Other general symptoms and signs: Secondary | ICD-10-CM | POA: Diagnosis not present

## 2023-08-24 DIAGNOSIS — H353221 Exudative age-related macular degeneration, left eye, with active choroidal neovascularization: Secondary | ICD-10-CM | POA: Diagnosis not present

## 2023-08-25 DIAGNOSIS — H353221 Exudative age-related macular degeneration, left eye, with active choroidal neovascularization: Secondary | ICD-10-CM | POA: Diagnosis not present

## 2023-08-25 DIAGNOSIS — H4312 Vitreous hemorrhage, left eye: Secondary | ICD-10-CM | POA: Diagnosis not present

## 2023-09-09 DIAGNOSIS — G4739 Other sleep apnea: Secondary | ICD-10-CM | POA: Diagnosis not present

## 2023-09-09 DIAGNOSIS — I491 Atrial premature depolarization: Secondary | ICD-10-CM | POA: Diagnosis not present

## 2023-09-09 DIAGNOSIS — I10 Essential (primary) hypertension: Secondary | ICD-10-CM | POA: Diagnosis not present

## 2023-09-09 DIAGNOSIS — Z86711 Personal history of pulmonary embolism: Secondary | ICD-10-CM | POA: Diagnosis not present

## 2023-09-09 DIAGNOSIS — Z86718 Personal history of other venous thrombosis and embolism: Secondary | ICD-10-CM | POA: Diagnosis not present

## 2023-09-09 DIAGNOSIS — R000 Tachycardia, unspecified: Secondary | ICD-10-CM | POA: Diagnosis not present

## 2023-09-09 DIAGNOSIS — I2692 Saddle embolus of pulmonary artery without acute cor pulmonale: Secondary | ICD-10-CM | POA: Diagnosis not present

## 2023-09-14 DIAGNOSIS — I491 Atrial premature depolarization: Secondary | ICD-10-CM | POA: Diagnosis not present

## 2023-09-25 DIAGNOSIS — I491 Atrial premature depolarization: Secondary | ICD-10-CM | POA: Diagnosis not present

## 2023-09-25 DIAGNOSIS — R000 Tachycardia, unspecified: Secondary | ICD-10-CM | POA: Diagnosis not present

## 2023-09-28 DIAGNOSIS — Z961 Presence of intraocular lens: Secondary | ICD-10-CM | POA: Diagnosis not present

## 2023-09-28 DIAGNOSIS — H40013 Open angle with borderline findings, low risk, bilateral: Secondary | ICD-10-CM | POA: Diagnosis not present

## 2023-09-28 DIAGNOSIS — H43391 Other vitreous opacities, right eye: Secondary | ICD-10-CM | POA: Diagnosis not present

## 2023-09-28 DIAGNOSIS — H353221 Exudative age-related macular degeneration, left eye, with active choroidal neovascularization: Secondary | ICD-10-CM | POA: Diagnosis not present

## 2023-09-29 DIAGNOSIS — H353221 Exudative age-related macular degeneration, left eye, with active choroidal neovascularization: Secondary | ICD-10-CM | POA: Diagnosis not present

## 2023-09-29 DIAGNOSIS — H4312 Vitreous hemorrhage, left eye: Secondary | ICD-10-CM | POA: Diagnosis not present

## 2023-10-02 DIAGNOSIS — R4586 Emotional lability: Secondary | ICD-10-CM | POA: Diagnosis not present

## 2023-10-02 DIAGNOSIS — G4733 Obstructive sleep apnea (adult) (pediatric): Secondary | ICD-10-CM | POA: Diagnosis not present

## 2023-10-02 DIAGNOSIS — Z125 Encounter for screening for malignant neoplasm of prostate: Secondary | ICD-10-CM | POA: Diagnosis not present

## 2023-10-02 DIAGNOSIS — R5383 Other fatigue: Secondary | ICD-10-CM | POA: Diagnosis not present

## 2023-10-02 DIAGNOSIS — F32A Depression, unspecified: Secondary | ICD-10-CM | POA: Diagnosis not present

## 2023-10-02 DIAGNOSIS — R361 Hematospermia: Secondary | ICD-10-CM | POA: Diagnosis not present

## 2023-10-02 DIAGNOSIS — I10 Essential (primary) hypertension: Secondary | ICD-10-CM | POA: Diagnosis not present

## 2023-10-02 DIAGNOSIS — Z7901 Long term (current) use of anticoagulants: Secondary | ICD-10-CM | POA: Diagnosis not present

## 2023-10-02 DIAGNOSIS — Z1331 Encounter for screening for depression: Secondary | ICD-10-CM | POA: Diagnosis not present

## 2023-10-02 DIAGNOSIS — E291 Testicular hypofunction: Secondary | ICD-10-CM | POA: Diagnosis not present

## 2023-10-05 DIAGNOSIS — H2511 Age-related nuclear cataract, right eye: Secondary | ICD-10-CM | POA: Diagnosis not present

## 2023-10-05 DIAGNOSIS — Z961 Presence of intraocular lens: Secondary | ICD-10-CM | POA: Diagnosis not present

## 2023-10-05 DIAGNOSIS — Z9849 Cataract extraction status, unspecified eye: Secondary | ICD-10-CM | POA: Diagnosis not present

## 2023-10-06 DIAGNOSIS — M5416 Radiculopathy, lumbar region: Secondary | ICD-10-CM | POA: Diagnosis not present

## 2023-10-09 ENCOUNTER — Other Ambulatory Visit (HOSPITAL_COMMUNITY): Payer: Self-pay | Admitting: Cardiology

## 2023-10-09 DIAGNOSIS — Z8249 Family history of ischemic heart disease and other diseases of the circulatory system: Secondary | ICD-10-CM

## 2023-10-13 DIAGNOSIS — R000 Tachycardia, unspecified: Secondary | ICD-10-CM | POA: Diagnosis not present

## 2023-10-13 DIAGNOSIS — I491 Atrial premature depolarization: Secondary | ICD-10-CM | POA: Diagnosis not present

## 2023-10-19 ENCOUNTER — Ambulatory Visit (HOSPITAL_COMMUNITY)
Admission: RE | Admit: 2023-10-19 | Discharge: 2023-10-19 | Disposition: A | Discharge status: Home / Self Care | Payer: Self-pay | Source: Ambulatory Visit | Attending: Cardiology | Admitting: Cardiology

## 2023-10-19 ENCOUNTER — Ambulatory Visit (HOSPITAL_COMMUNITY): Payer: Self-pay | Admitting: Cardiology

## 2023-10-19 DIAGNOSIS — Z8249 Family history of ischemic heart disease and other diseases of the circulatory system: Secondary | ICD-10-CM | POA: Insufficient documentation

## 2023-10-19 DIAGNOSIS — Z136 Encounter for screening for cardiovascular disorders: Secondary | ICD-10-CM | POA: Insufficient documentation

## 2023-11-03 DIAGNOSIS — H353221 Exudative age-related macular degeneration, left eye, with active choroidal neovascularization: Secondary | ICD-10-CM | POA: Diagnosis not present

## 2023-11-03 DIAGNOSIS — H3581 Retinal edema: Secondary | ICD-10-CM | POA: Diagnosis not present

## 2023-11-03 DIAGNOSIS — H4312 Vitreous hemorrhage, left eye: Secondary | ICD-10-CM | POA: Diagnosis not present

## 2023-11-27 ENCOUNTER — Ambulatory Visit (INDEPENDENT_AMBULATORY_CARE_PROVIDER_SITE_OTHER): Payer: Medicare Other | Admitting: Otolaryngology

## 2023-11-27 ENCOUNTER — Encounter (INDEPENDENT_AMBULATORY_CARE_PROVIDER_SITE_OTHER): Payer: Self-pay | Admitting: Otolaryngology

## 2023-11-27 VITALS — BP 125/93 | HR 88 | Temp 97.7°F

## 2023-11-27 DIAGNOSIS — H903 Sensorineural hearing loss, bilateral: Secondary | ICD-10-CM

## 2023-11-27 NOTE — Progress Notes (Signed)
 Patient ID: Jacob [name], male   DOB: March 03, 1951, 73 y.o.   MRN: [id number]  Follow up: Hearing loss  HPI:  The patient is a 73 year old male who returns today for his follow-up evaluation.  The patient was last seen in September 2024 for his hearing loss.  He was noted to have bilateral symmetric high-frequency sensorineural hearing loss.  He was previously fitted with bilateral hearing aids.  The patient returns today reporting no significant changes noted.  The hearing aids have helped.  He denies any otalgia, otorrhea, or vertigo.    Exam: General: Communicates without difficulty, well nourished, no acute distress. Head: Normocephalic, no evidence injury, no tenderness, facial buttresses intact without stepoff. Face/sinus: No tenderness to palpation and percussion. Facial movement is normal and symmetric. Eyes: PERRL, EOMI. No scleral icterus, conjunctivae clear. Neuro: CN II exam reveals vision grossly intact.  No nystagmus at any point of gaze. Ears: Auricles well formed without lesions.  Ear canals are intact without mass or lesion.  No erythema or edema is appreciated.  The TMs are intact without fluid. Nose: External evaluation reveals normal support and skin without lesions.  Dorsum is intact.  Anterior rhinoscopy reveals pink mucosa over anterior aspect of inferior turbinates and intact septum.  No purulence noted. Oral:  Oral cavity and oropharynx are intact, symmetric, without erythema or edema.  Mucosa is moist without lesions. Neck: Full range of motion without pain.  There is no significant lymphadenopathy.  No masses palpable.  Thyroid  bed within normal limits to palpation.  Parotid glands and submandibular glands equal bilaterally without mass.  Trachea is midline. Neuro:  CN 2-12 grossly intact. Gait normal.   Assessment: 1.  Subjectively stable bilateral high-frequency sensorineural hearing loss. 2.  The patient's ear canals, tympanic membranes, and middle ear spaces are all normal.    Plan: 1.  The physical exam findings are reviewed with the patient.   2.  The patient should continue with the use of his bilateral hearing aids.  3.  The patient will return for re-evaluation in 1 year.

## 2023-11-30 DIAGNOSIS — H903 Sensorineural hearing loss, bilateral: Secondary | ICD-10-CM | POA: Insufficient documentation

## 2023-12-08 DIAGNOSIS — H353221 Exudative age-related macular degeneration, left eye, with active choroidal neovascularization: Secondary | ICD-10-CM | POA: Diagnosis not present

## 2023-12-08 DIAGNOSIS — H4312 Vitreous hemorrhage, left eye: Secondary | ICD-10-CM | POA: Diagnosis not present

## 2023-12-21 DIAGNOSIS — Z85828 Personal history of other malignant neoplasm of skin: Secondary | ICD-10-CM | POA: Diagnosis not present

## 2023-12-21 DIAGNOSIS — L821 Other seborrheic keratosis: Secondary | ICD-10-CM | POA: Diagnosis not present

## 2023-12-21 DIAGNOSIS — L570 Actinic keratosis: Secondary | ICD-10-CM | POA: Diagnosis not present

## 2023-12-21 DIAGNOSIS — D1801 Hemangioma of skin and subcutaneous tissue: Secondary | ICD-10-CM | POA: Diagnosis not present

## 2023-12-21 DIAGNOSIS — D692 Other nonthrombocytopenic purpura: Secondary | ICD-10-CM | POA: Diagnosis not present

## 2023-12-21 DIAGNOSIS — L812 Freckles: Secondary | ICD-10-CM | POA: Diagnosis not present

## 2023-12-28 DIAGNOSIS — F32A Depression, unspecified: Secondary | ICD-10-CM | POA: Diagnosis not present

## 2023-12-28 DIAGNOSIS — Z1159 Encounter for screening for other viral diseases: Secondary | ICD-10-CM | POA: Diagnosis not present

## 2023-12-28 DIAGNOSIS — F339 Major depressive disorder, recurrent, unspecified: Secondary | ICD-10-CM | POA: Diagnosis not present

## 2023-12-28 DIAGNOSIS — Z1331 Encounter for screening for depression: Secondary | ICD-10-CM | POA: Diagnosis not present

## 2023-12-31 DIAGNOSIS — Z96612 Presence of left artificial shoulder joint: Secondary | ICD-10-CM | POA: Diagnosis not present

## 2024-01-07 DIAGNOSIS — M5416 Radiculopathy, lumbar region: Secondary | ICD-10-CM | POA: Diagnosis not present

## 2024-01-12 DIAGNOSIS — H3581 Retinal edema: Secondary | ICD-10-CM | POA: Diagnosis not present

## 2024-01-12 DIAGNOSIS — H353221 Exudative age-related macular degeneration, left eye, with active choroidal neovascularization: Secondary | ICD-10-CM | POA: Diagnosis not present

## 2024-01-12 DIAGNOSIS — H4312 Vitreous hemorrhage, left eye: Secondary | ICD-10-CM | POA: Diagnosis not present

## 2024-06-16 ENCOUNTER — Ambulatory Visit: Admitting: Oncology

## 2024-06-16 ENCOUNTER — Other Ambulatory Visit
